# Patient Record
Sex: Female | Born: 1955 | Race: White | Hispanic: No | Smoking: Never smoker
Health system: Southern US, Community
[De-identification: ages and names within clinical notes are randomized; demographics above are authoritative.]

## PROBLEM LIST (undated history)

## (undated) DIAGNOSIS — Z780 Asymptomatic menopausal state: Secondary | ICD-10-CM

## (undated) DIAGNOSIS — N6019 Diffuse cystic mastopathy of unspecified breast: Secondary | ICD-10-CM

## (undated) DIAGNOSIS — K219 Gastro-esophageal reflux disease without esophagitis: Secondary | ICD-10-CM

## (undated) HISTORY — DX: Diffuse cystic mastopathy of unspecified breast: N60.19

## (undated) HISTORY — PX: TUBAL LIGATION: SHX77

## (undated) HISTORY — DX: Gastro-esophageal reflux disease without esophagitis: K21.9

## (undated) HISTORY — PX: MOUTH SURGERY: SHX715

## (undated) HISTORY — DX: Asymptomatic menopausal state: Z78.0

---

## 2000-08-20 HISTORY — PX: BREAST CYST ASPIRATION: SHX578

## 2005-01-23 ENCOUNTER — Ambulatory Visit: Payer: Self-pay

## 2006-01-30 ENCOUNTER — Ambulatory Visit: Payer: Self-pay

## 2006-03-21 ENCOUNTER — Ambulatory Visit: Payer: Self-pay | Admitting: Gastroenterology

## 2007-02-13 ENCOUNTER — Ambulatory Visit: Payer: Self-pay | Admitting: Obstetrics and Gynecology

## 2008-02-17 ENCOUNTER — Ambulatory Visit: Payer: Self-pay

## 2008-08-20 HISTORY — PX: BREAST BIOPSY: SHX20

## 2009-03-23 ENCOUNTER — Ambulatory Visit: Payer: Self-pay | Admitting: Internal Medicine

## 2009-03-30 ENCOUNTER — Ambulatory Visit: Payer: Self-pay | Admitting: Internal Medicine

## 2009-04-19 ENCOUNTER — Ambulatory Visit: Payer: Self-pay | Admitting: Surgery

## 2009-05-10 ENCOUNTER — Ambulatory Visit: Payer: Self-pay | Admitting: Internal Medicine

## 2009-11-29 ENCOUNTER — Ambulatory Visit: Payer: Self-pay | Admitting: Internal Medicine

## 2010-03-28 ENCOUNTER — Ambulatory Visit: Payer: Self-pay | Admitting: Internal Medicine

## 2010-04-05 ENCOUNTER — Emergency Department: Payer: Self-pay | Admitting: Emergency Medicine

## 2010-05-17 ENCOUNTER — Ambulatory Visit: Payer: Self-pay | Admitting: Internal Medicine

## 2011-03-30 ENCOUNTER — Ambulatory Visit: Payer: Self-pay | Admitting: Internal Medicine

## 2011-05-16 ENCOUNTER — Encounter: Payer: Self-pay | Admitting: Internal Medicine

## 2012-01-07 ENCOUNTER — Encounter: Payer: Self-pay | Admitting: Internal Medicine

## 2012-01-08 ENCOUNTER — Encounter: Payer: Self-pay | Admitting: Internal Medicine

## 2012-01-08 ENCOUNTER — Ambulatory Visit (INDEPENDENT_AMBULATORY_CARE_PROVIDER_SITE_OTHER): Payer: No Typology Code available for payment source | Admitting: Internal Medicine

## 2012-01-08 ENCOUNTER — Other Ambulatory Visit (HOSPITAL_COMMUNITY)
Admission: RE | Admit: 2012-01-08 | Discharge: 2012-01-08 | Disposition: A | Discharge status: Home / Self Care | Payer: No Typology Code available for payment source | Source: Ambulatory Visit | Attending: Internal Medicine | Admitting: Internal Medicine

## 2012-01-08 VITALS — BP 100/62 | HR 90 | Temp 98.3°F | Resp 14 | Ht 60.0 in | Wt 105.8 lb

## 2012-01-08 DIAGNOSIS — Z113 Encounter for screening for infections with a predominantly sexual mode of transmission: Secondary | ICD-10-CM | POA: Insufficient documentation

## 2012-01-08 DIAGNOSIS — Z0000 Encounter for general adult medical examination without abnormal findings: Secondary | ICD-10-CM

## 2012-01-08 DIAGNOSIS — N72 Inflammatory disease of cervix uteri: Secondary | ICD-10-CM

## 2012-01-08 DIAGNOSIS — Z01419 Encounter for gynecological examination (general) (routine) without abnormal findings: Secondary | ICD-10-CM | POA: Insufficient documentation

## 2012-01-08 DIAGNOSIS — Z124 Encounter for screening for malignant neoplasm of cervix: Secondary | ICD-10-CM

## 2012-01-08 DIAGNOSIS — G609 Hereditary and idiopathic neuropathy, unspecified: Secondary | ICD-10-CM | POA: Insufficient documentation

## 2012-01-08 DIAGNOSIS — Z1159 Encounter for screening for other viral diseases: Secondary | ICD-10-CM | POA: Insufficient documentation

## 2012-01-08 DIAGNOSIS — G608 Other hereditary and idiopathic neuropathies: Secondary | ICD-10-CM

## 2012-01-08 DIAGNOSIS — Z1211 Encounter for screening for malignant neoplasm of colon: Secondary | ICD-10-CM

## 2012-01-08 MED ORDER — AZITHROMYCIN 500 MG PO TABS: ORAL_TABLET | ORAL | Status: AC

## 2012-01-08 MED ORDER — CEFTRIAXONE SODIUM 1 G IJ SOLR
250.0000 mg | Freq: Once | INTRAMUSCULAR | Status: AC
  Administered 2012-01-08: 250 mg via INTRAMUSCULAR

## 2012-01-08 NOTE — Assessment & Plan Note (Addendum)
With history of vaginal discharge, strawberry cervix on exam, Will treat empirically for GC/chlamydia pending cultures since she is sexually active,

## 2012-01-08 NOTE — Progress Notes (Signed)
Patient ID: Debbie Sanchez, female   DOB: 1956-04-10, 56 y.o.   MRN: [id number] Patient Active Problem List  Diagnoses  . Neuropathy, idiopathic  . Cervicitis and endocervicitis  . Routine general medical examination at a health care facility    Subjective:  CC:   Chief Complaint  Patient presents with  . Follow-up    HPI:   Debbie Sanchez a 56 y.o. female who presents for her annual gyn exam.  She was last seen one year ago for her annual exam.  She has had a tumultuous years.  She was widowed 3 years ago, and has had an intentional wt loss of 35 lbs over the past year with diet and exercise.  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 With the weight loss her sex drive has returned.   She is sexually active with one partner but states that she is not emotionally involved and does not plan to remarry.  She is using barrier protection consistently with condoms  She has developed vaginal discharge recently without pain.  No systemic symptoms. Her last mammo August 2012.   She has noted occasional blood in her stools and is due for colonoscopy with Dr. Lutricia Feil. Has had a history of syncopal episodes and urinary tract infections but none in the past year. Needs full DEXA since she has lost 35 lbs.      Past Medical History  Diagnosis Date  . Fibrocystic breast disease     rt breast aspiration 2002  . Menopause     last menses 2008    Past Surgical History  Procedure Date  . Tubal ligation   . Cesarean section 1998  . Mouth surgery          The following portions of the patient's history were reviewed and updated as appropriate: Allergies, current medications, and problem list.    Review of Systems:   12 Pt  review of systems was negative except those addressed in the HPI,     History   Social History  . Marital Status: Widowed    Spouse Name: N/A    Number of Children: N/A  . Years of Education: N/A   Occupational History  . Not on file.   Social History Main  Topics  . Smoking status: Never Smoker   . Smokeless tobacco: Never Used  . Alcohol Use: Yes     rare  . Drug Use: No  . Sexually Active: Not on file   Other Topics Concern  . Not on file   Social History Narrative  . No narrative on file    Objective:  BP 100/62  Pulse 90  Temp(Src) 98.3 F (36.8 C) (Oral)  Resp 14  Ht 5' (1.524 m)  Wt 105 lb 12 oz (47.968 kg)  BMI 20.65 kg/m2  SpO2 94%  General appearance: alert, cooperative and appears stated age Ears: normal TM's and external ear canals both ears Throat: lips, mucosa, and tongue normal; teeth and gums normal Neck: no adenopathy, no carotid bruit, supple, symmetrical, trachea midline and thyroid not enlarged, symmetric, no tenderness/mass/nodules Back: symmetric, no curvature. ROM normal. No CVA tenderness. Lungs: clear to auscultation bilaterally Heart: regular rate and rhythm, S1, S2 normal, no murmur, click, rub or gallop Abdomen: soft, non-tender; bowel sounds normal; no masses,  no organomegaly Pulses: 2+ and symmetric Skin: Skin color, texture, turgor normal. No rashes or lesions Lymph nodes: Cervical, supraclavicular, and axillary nodes normal.  Assessment and Plan:  Cervicitis and endocervicitis  With history of vaginal discharge, strawberry cervix on exam, Will treat empirically for GC/chlamydia pending cultures since she is sexually active,  Routine general medical examination at a health care facility Breast and pelvic/PAP were done today.     Updated Medication List Outpatient Encounter Prescriptions as of 01/08/2012  Medication Sig Dispense Refill  . Ascorbic Acid (VITAMIN C) 100 MG tablet Take 100 mg by mouth daily.      . Calcium Carbonate (CALCIUM 500 PO) Take 1 tablet by mouth daily.      . Cholecalciferol (VITAMIN D) 2000 UNITS CAPS Take 1 capsule by mouth daily.      . Diphenhydramine-APAP, sleep, (TYLENOL PM EXTRA STRENGTH PO) Take by mouth. OTC take as directed.      . IBUPROFEN PO Take by  mouth as directed.      Marland Kitchen MELATONIN ER PO Take by mouth.      . Multiple Vitamin (MULTIVITAMIN) tablet Take 1 tablet by mouth daily.      Marland Kitchen azithromycin (ZITHROMAX) 500 MG tablet 4 tablets one time only,  4 tablet  0  . cefTRIAXone (ROCEPHIN) injection 250 mg          Orders Placed This Encounter  Procedures  . HM MAMMOGRAPHY  . HM PAP SMEAR  . Ambulatory referral to Gastroenterology  . HM COLONOSCOPY    No Follow-up on file.

## 2012-01-09 ENCOUNTER — Encounter: Payer: Self-pay | Admitting: Internal Medicine

## 2012-01-09 DIAGNOSIS — Z0000 Encounter for general adult medical examination without abnormal findings: Secondary | ICD-10-CM | POA: Insufficient documentation

## 2012-01-09 NOTE — Assessment & Plan Note (Signed)
Breast and pelvic/PAP were done today.

## 2012-01-11 ENCOUNTER — Telehealth: Payer: Self-pay | Admitting: Internal Medicine

## 2012-01-11 ENCOUNTER — Other Ambulatory Visit: Payer: Self-pay | Admitting: Internal Medicine

## 2012-01-11 NOTE — Telephone Encounter (Signed)
Her blood tests were all normal, including including cholesterol thyroid and diabetes screen.  The cultures we took at her visit are not back yet

## 2012-01-11 NOTE — Telephone Encounter (Signed)
Patient informed/SLS  

## 2012-01-17 ENCOUNTER — Encounter: Payer: Self-pay | Admitting: Internal Medicine

## 2012-03-03 ENCOUNTER — Encounter: Payer: Self-pay | Admitting: Internal Medicine

## 2012-03-03 ENCOUNTER — Ambulatory Visit (INDEPENDENT_AMBULATORY_CARE_PROVIDER_SITE_OTHER): Payer: No Typology Code available for payment source | Admitting: Internal Medicine

## 2012-03-03 VITALS — BP 120/78 | HR 72 | Temp 98.5°F | Wt 103.2 lb

## 2012-03-03 DIAGNOSIS — L0231 Cutaneous abscess of buttock: Secondary | ICD-10-CM

## 2012-03-03 DIAGNOSIS — L03317 Cellulitis of buttock: Secondary | ICD-10-CM

## 2012-03-03 DIAGNOSIS — Z113 Encounter for screening for infections with a predominantly sexual mode of transmission: Secondary | ICD-10-CM

## 2012-03-03 MED ORDER — CEPHALEXIN 500 MG PO TABS: 500.0000 mg | ORAL_TABLET | Freq: Three times a day (TID) | ORAL | Status: AC

## 2012-03-03 NOTE — Progress Notes (Signed)
Patient ID: Debbie Sanchez, female   DOB: 03-30-1956, 56 y.o.   MRN: [id number] Patient Active Problem List  Diagnosis  . Neuropathy, idiopathic  . Cervicitis and endocervicitis  . Routine general medical examination at a health care facility  . Screening for STD (sexually transmitted disease)    Subjective:  CC:   Chief Complaint  Patient presents with  . screening for std    HPI:   Debbie Sanchez a 56 y.o. female who presents  Past Medical History  Diagnosis Date  . Fibrocystic breast disease     rt breast aspiration 2002  . Menopause     last menses 2008    Past Surgical History  Procedure Date  . Tubal ligation   . Cesarean section 1998  . Mouth surgery          The following portions of the patient's history were reviewed and updated as appropriate: Allergies, current medications, and problem list.    Review of Systems:   12 Pt  review of systems was negative except those addressed in the HPI,     History   Social History  . Marital Status: Widowed    Spouse Name: N/A    Number of Children: N/A  . Years of Education: N/A   Occupational History  . Not on file.   Social History Main Topics  . Smoking status: Never Smoker   . Smokeless tobacco: Never Used  . Alcohol Use: Yes     rare  . Drug Use: No  . Sexually Active: Not on file   Other Topics Concern  . Not on file   Social History Narrative  . No narrative on file    Objective:  BP 120/78  Pulse 72  Temp 98.5 F (36.9 C) (Oral)  Wt 103 lb 4 oz (46.834 kg)  SpO2 99%  . General appearance: alert, cooperative and appears stated age Skin: Skin color, texture, turgor normal. Several papular nodules on chin. GYN;  Normal vaginal vault. Some thin yellow discharge. Lymph nodes: Cervical, supraclavicular, and axillary nodes normal.  Assessment and Plan:  Screening for STD (sexually transmitted disease) She is requesting screenig for HIV, Hep C HSV, GC and chlamydia and RPR due to  recent unprotected sex.   Updated Medication List Outpatient Encounter Prescriptions as of 03/03/2012  Medication Sig Dispense Refill  . Ascorbic Acid (VITAMIN C) 100 MG tablet Take 100 mg by mouth daily.      . Calcium Carbonate (CALCIUM 500 PO) Take 1 tablet by mouth daily.      . Cholecalciferol (VITAMIN D) 2000 UNITS CAPS Take 1 capsule by mouth daily.      . Diphenhydramine-APAP, sleep, (TYLENOL PM EXTRA STRENGTH PO) Take by mouth. OTC take as directed.      . IBUPROFEN PO Take by mouth as directed.      Marland Kitchen MELATONIN ER PO Take by mouth.      . Multiple Vitamin (MULTIVITAMIN) tablet Take 1 tablet by mouth daily.      . Cephalexin 500 MG tablet Take 1 tablet (500 mg total) by mouth 3 (three) times daily.  21 tablet  0     Orders Placed This Encounter  Procedures  . Culture, routine-genital  . HSV(herpes simplex vrs) 1+2 ab-IgM  . HSV(herpes simplex vrs) 1+2 ab-IgG  . RPR    No Follow-up on file.

## 2012-03-04 ENCOUNTER — Ambulatory Visit: Payer: No Typology Code available for payment source | Admitting: Internal Medicine

## 2012-03-04 ENCOUNTER — Encounter: Payer: Self-pay | Admitting: Internal Medicine

## 2012-03-04 DIAGNOSIS — Z0000 Encounter for general adult medical examination without abnormal findings: Secondary | ICD-10-CM | POA: Insufficient documentation

## 2012-03-04 DIAGNOSIS — Z113 Encounter for screening for infections with a predominantly sexual mode of transmission: Secondary | ICD-10-CM | POA: Insufficient documentation

## 2012-03-04 LAB — HSV(HERPES SIMPLEX VRS) I + II AB-IGG: HSV 1 Glycoprotein G Ab, IgG: <0.1 IV

## 2012-03-04 NOTE — Assessment & Plan Note (Signed)
She is requesting screenig for HIV, Hep C HSV, GC and chlamydia and RPR due to recent unprotected sex.

## 2012-03-05 LAB — HM PAP SMEAR: HM Pap smear: NORMAL

## 2012-03-06 ENCOUNTER — Encounter: Payer: Self-pay | Admitting: Internal Medicine

## 2012-03-26 ENCOUNTER — Ambulatory Visit: Payer: Self-pay | Admitting: Gastroenterology

## 2012-04-01 ENCOUNTER — Telehealth: Payer: Self-pay | Admitting: Internal Medicine

## 2012-04-01 NOTE — Telephone Encounter (Signed)
Colonoscopy was normal.  Repeat in 5 yrs

## 2012-04-05 LAB — HM MAMMOGRAPHY: HM Mammogram: NORMAL

## 2012-04-07 ENCOUNTER — Ambulatory Visit: Payer: Self-pay | Admitting: Internal Medicine

## 2012-04-11 ENCOUNTER — Encounter: Payer: Self-pay | Admitting: Internal Medicine

## 2012-04-17 ENCOUNTER — Encounter: Payer: Self-pay | Admitting: Internal Medicine

## 2012-10-04 ENCOUNTER — Other Ambulatory Visit: Payer: Self-pay

## 2013-03-03 ENCOUNTER — Encounter: Payer: Self-pay | Admitting: Internal Medicine

## 2013-03-03 ENCOUNTER — Ambulatory Visit (INDEPENDENT_AMBULATORY_CARE_PROVIDER_SITE_OTHER): Payer: No Typology Code available for payment source | Admitting: Internal Medicine

## 2013-03-03 VITALS — BP 94/54 | HR 90 | Temp 98.3°F | Resp 16 | Ht 60.0 in | Wt 98.0 lb

## 2013-03-03 DIAGNOSIS — Z0000 Encounter for general adult medical examination without abnormal findings: Secondary | ICD-10-CM

## 2013-03-03 DIAGNOSIS — R5383 Other fatigue: Secondary | ICD-10-CM

## 2013-03-03 DIAGNOSIS — E559 Vitamin D deficiency, unspecified: Secondary | ICD-10-CM

## 2013-03-03 DIAGNOSIS — I471 Supraventricular tachycardia: Secondary | ICD-10-CM

## 2013-03-03 DIAGNOSIS — R5381 Other malaise: Secondary | ICD-10-CM

## 2013-03-03 DIAGNOSIS — E673 Hypervitaminosis D: Secondary | ICD-10-CM

## 2013-03-03 DIAGNOSIS — R634 Abnormal weight loss: Secondary | ICD-10-CM

## 2013-03-03 DIAGNOSIS — Z1239 Encounter for other screening for malignant neoplasm of breast: Secondary | ICD-10-CM

## 2013-03-03 LAB — POCT URINALYSIS DIPSTICK
Bilirubin, UA: NEGATIVE
Glucose, UA: NEGATIVE
Ketones, UA: NEGATIVE
pH, UA: 7

## 2013-03-03 NOTE — Progress Notes (Signed)
Patient ID: Debbie Sanchez, female   DOB: 08/17/1956, 57 y.o.   MRN: [id number]   Subjective:     Debbie Sanchez is a 57 y.o. female and is here for a comprehensive physical exam. The patient reports frequent  nocturia with hot flashes,  And falling asleep for cat naps during the day which are aggravated by long solitary drives .  If she takes a 15 min nap she is completely revived. Caffeine intake reviewed.  Even if she abstains from all liquid after 3 pm,  Still voids 3 times per night  Other times only once per night .       History   Social History  . Marital Status: Widowed    Spouse Name: N/A    Number of Children: N/A  . Years of Education: N/A   Occupational History  . Not on file.   Social History Main Topics  . Smoking status: Never Smoker   . Smokeless tobacco: Never Used  . Alcohol Use: Yes     Comment: rare  . Drug Use: No  . Sexually Active: Not on file   Other Topics Concern  . Not on file   Social History Narrative  . No narrative on file   Health Maintenance  Topic Date Due  . Influenza Vaccine  04/20/2013  . Mammogram  04/07/2014  . Pap Smear  01/08/2015  . Tetanus/tdap  03/03/2020  . Colonoscopy  03/26/2022    The following portions of the patient's history were reviewed and updated as appropriate: allergies, past family history, past medical history, past social history, past surgical history and problem list.  Review of Systems A comprehensive review of systems was negative.   Objective:   BP 94/54  Pulse 90  Temp(Src) 98.3 F (36.8 C) (Oral)  Resp 16  Ht 5' (1.524 m)  Wt 98 lb (44.453 kg)  BMI 19.14 kg/m2  SpO2 98%  BP 94/54  Pulse 90  Temp(Src) 98.3 F (36.8 C) (Oral)  Resp 16  Ht 5' (1.524 m)  Wt 98 lb (44.453 kg)  BMI 19.14 kg/m2  SpO2 98%  General Appearance:    Alert, cooperative, no distress, appears stated age  Head:    Normocephalic, without obvious abnormality, atraumatic  Eyes:    PERRL, conjunctiva/corneas clear, EOM's  intact, fundi    benign, both eyes  Ears:    Normal TM's and external ear canals, both ears  Nose:   Nares normal, septum midline, mucosa normal, no drainage    or sinus tenderness  Throat:   Lips, mucosa, and tongue normal; teeth and gums normal  Neck:   Supple, symmetrical, trachea midline, no adenopathy;    thyroid:  no enlargement/tenderness/nodules; no carotid   bruit or JVD  Back:     Symmetric, no curvature, ROM normal, no CVA tenderness  Lungs:     Clear to auscultation bilaterally, respirations unlabored  Chest Wall:    No tenderness or deformity   Heart:    Regular rate and rhythm, S1 and S2 normal, no murmur, rub   or gallop  Breast Exam:    No tenderness, masses, or nipple abnormality  Abdomen:     Soft, non-tender, bowel sounds active all four quadrants,    no masses, no organomegaly        Extremities:   Extremities normal, atraumatic, no cyanosis or edema  Pulses:   2+ and symmetric all extremities  Skin:   Skin color, texture, turgor normal, no rashes or lesions  Lymph nodes:   Cervical, supraclavicular, and axillary nodes normal  Neurologic:   CNII-XII intact, normal strength, sensation and reflexes    throughout    .    Assessment:   Routine general medical examination at a health care facility Annual comprehensive exam was done including breast, but excluding pelvic exam . All screenings have been addressed .   Hypervitaminosis d She has been taking excessive amounts of daily Vitamin D and her level is high at 120.  She has been advised to stop all supplements, and repeat in 3 months    Updated Medication List Outpatient Encounter Prescriptions as of 03/03/2013  Medication Sig Dispense Refill  . Ascorbic Acid (VITAMIN C) 100 MG tablet Take 1,000 mg by mouth 2 (two) times daily.       . Calcium Carbonate (CALCIUM 500 PO) Take 1 tablet by mouth daily.      . Cholecalciferol (VITAMIN D3) 5000 UNITS CAPS Take 1 capsule by mouth 2 (two) times daily.      .  Diphenhydramine-APAP, sleep, (TYLENOL PM EXTRA STRENGTH PO) Take by mouth. OTC take as directed.      . IBUPROFEN PO Take by mouth as directed.      Marland Kitchen MELATONIN ER PO Take by mouth.      . Multiple Vitamin (MULTIVITAMIN) tablet Take 1 tablet by mouth daily.      . [DISCONTINUED] Cholecalciferol (VITAMIN D) 2000 UNITS CAPS Take 1 capsule by mouth daily.       No facility-administered encounter medications on file as of 03/03/2013.

## 2013-03-04 ENCOUNTER — Encounter: Payer: Self-pay | Admitting: Internal Medicine

## 2013-03-04 DIAGNOSIS — E673 Hypervitaminosis D: Secondary | ICD-10-CM | POA: Insufficient documentation

## 2013-03-04 LAB — COMPREHENSIVE METABOLIC PANEL
ALT: 25 U/L (ref 0–35)
AST: 25 U/L (ref 0–37)
Albumin: 4.4 g/dL (ref 3.5–5.2)
CO2: 31 mEq/L (ref 19–32)
Calcium: 9.6 mg/dL (ref 8.4–10.5)
Chloride: 101 mEq/L (ref 96–112)
Creatinine, Ser: 0.7 mg/dL (ref 0.4–1.2)
GFR: 94.85 mL/min (ref >60.00)
Potassium: 3.8 mEq/L (ref 3.5–5.1)
Total Protein: 7.7 g/dL (ref 6.0–8.3)

## 2013-03-04 LAB — CBC WITH DIFFERENTIAL/PLATELET
Basophils Absolute: 0 10*3/uL (ref 0.0–0.1)
Eosinophils Absolute: 0.1 10*3/uL (ref 0.0–0.7)
Lymphocytes Relative: 24.3 % (ref 12.0–46.0)
MCHC: 33.5 g/dL (ref 30.0–36.0)
MCV: 94 fl (ref 78.0–100.0)
Monocytes Absolute: 0.6 10*3/uL (ref 0.1–1.0)
Neutrophils Relative %: 65.7 % (ref 43.0–77.0)
Platelets: 250 10*3/uL (ref 150.0–400.0)
RDW: 12.8 % (ref 11.5–14.6)

## 2013-03-04 LAB — TSH: TSH: 0.88 u[IU]/mL (ref 0.35–5.50)

## 2013-03-04 NOTE — Assessment & Plan Note (Signed)
Annual comprehensive exam was done including breast, but excluding pelvic exam . All screenings have been addressed .

## 2013-03-04 NOTE — Assessment & Plan Note (Signed)
She has been taking excessive amounts of daily Vitamin D and her level is high at 120.  She has been advised to stop all supplements, and repeat in 3 months

## 2013-03-05 ENCOUNTER — Telehealth: Payer: Self-pay | Admitting: *Deleted

## 2013-03-05 NOTE — Telephone Encounter (Signed)
Patient called and stated work is not set up for 3 D mammogram at this time, do you advise her wait for 3 D or go ahead at work and have regular mammogram.

## 2013-03-06 NOTE — Telephone Encounter (Signed)
I would wait since she has very dense breasts.. where does she get her mammograms done because [facility] will have 3 D  in August.

## 2013-03-06 NOTE — Telephone Encounter (Signed)
Patient is going to have the 3D done at [facility]. FYI

## 2013-05-25 ENCOUNTER — Encounter: Payer: Self-pay | Admitting: Emergency Medicine

## 2013-06-02 ENCOUNTER — Telehealth: Payer: Self-pay | Admitting: *Deleted

## 2013-06-02 NOTE — Telephone Encounter (Signed)
Pt called needing order for repeat Vitamin D. [place] [facility] order placed in Dr. Melina Schools folder. Call pt when ready

## 2013-06-03 NOTE — Telephone Encounter (Signed)
Pt notified order ready for pick up 

## 2013-06-25 ENCOUNTER — Other Ambulatory Visit: Payer: Self-pay

## 2013-10-28 ENCOUNTER — Telehealth: Payer: Self-pay | Admitting: *Deleted

## 2013-10-28 NOTE — Telephone Encounter (Signed)
You requested patient have 3 D mammogram and [facility] still does not have equipment per patient please advise/

## 2013-10-28 NOTE — Telephone Encounter (Signed)
[facility] is where I recommend,  On [address]

## 2013-10-29 NOTE — Telephone Encounter (Signed)
Patient notified and will decide.

## 2014-01-13 ENCOUNTER — Telehealth: Payer: Self-pay | Admitting: Internal Medicine

## 2014-01-13 DIAGNOSIS — Z1239 Encounter for other screening for malignant neoplasm of breast: Secondary | ICD-10-CM

## 2014-01-13 NOTE — Telephone Encounter (Signed)
Pt called wanting to know if dr Derrel Nip  wants her to have 3d mammogram or a regular mammogram

## 2014-01-13 NOTE — Telephone Encounter (Signed)
Please advise 

## 2014-01-13 NOTE — Telephone Encounter (Signed)
Yes, and [facility] doe snot have theirs up and running yet.,  Will need to go to [facility],  When was her last  One?  I have none since 2013.

## 2014-01-14 NOTE — Telephone Encounter (Signed)
Patient notified preference for 3D mammogram, patient stated the date of 2013 is accurate.

## 2014-01-14 NOTE — Telephone Encounter (Signed)
ordered

## 2014-03-16 ENCOUNTER — Encounter: Payer: No Typology Code available for payment source | Admitting: Internal Medicine

## 2014-04-05 ENCOUNTER — Encounter: Payer: Self-pay | Admitting: Internal Medicine

## 2014-04-05 ENCOUNTER — Ambulatory Visit (INDEPENDENT_AMBULATORY_CARE_PROVIDER_SITE_OTHER): Payer: No Typology Code available for payment source | Admitting: Internal Medicine

## 2014-04-05 VITALS — BP 118/72 | HR 80 | Temp 98.4°F | Resp 16 | Ht 60.0 in | Wt 102.0 lb

## 2014-04-05 DIAGNOSIS — Z0000 Encounter for general adult medical examination without abnormal findings: Secondary | ICD-10-CM

## 2014-04-05 DIAGNOSIS — M810 Age-related osteoporosis without current pathological fracture: Secondary | ICD-10-CM | POA: Insufficient documentation

## 2014-04-05 DIAGNOSIS — M25552 Pain in left hip: Secondary | ICD-10-CM

## 2014-04-05 DIAGNOSIS — Z13820 Encounter for screening for osteoporosis: Secondary | ICD-10-CM

## 2014-04-05 DIAGNOSIS — N941 Unspecified dyspareunia: Secondary | ICD-10-CM

## 2014-04-05 DIAGNOSIS — M25559 Pain in unspecified hip: Secondary | ICD-10-CM

## 2014-04-05 DIAGNOSIS — E673 Hypervitaminosis D: Secondary | ICD-10-CM

## 2014-04-05 DIAGNOSIS — IMO0002 Reserved for concepts with insufficient information to code with codable children: Secondary | ICD-10-CM

## 2014-04-05 DIAGNOSIS — Z1239 Encounter for other screening for malignant neoplasm of breast: Secondary | ICD-10-CM

## 2014-04-05 NOTE — Progress Notes (Signed)
Patient ID: Debbie Sanchez, female   DOB: [birth year], 58 y.o.   MRN: [id number]    Subjective:     Debbie Sanchez is a 58 y.o. female and is here for a comprehensive physical exam. The patient reports multiple minor issues .  Son is back home living with her after leaving 4 yr college prematurely,  Now going to community college .  Dtr is sr in high school.  Also planning on going to community college.  His presence at home has created some internal conflict directed at her boyfriend when he spends the night. She has been in a stable monogamous relationship for several years. 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  Spent 30 minutes reviewing a list of medical concerns prior to proceeding with her annual exam, including :  1)  has noticed interval episodes of left sided hip pain that radiates down lateral side of left leg.  Occurs during intercourse .  Has also noted discomfort/dyspareunia during pool sex. Has an above ground swimming pool and wonders if the chlorine  Is irritating her vaginal tissue     2) Recurrence of floaters in left eye.  Retina looked fine at Urgent Care so they did a CT scan and fingerstick glucose  Both of which were fine  3)Small nontender mss on left forearm, new.    4) Since she has  started drinking  coffee,  She Has been more constipated   5) Several epiodes of recurrent sharp pain starting in her left ear that radiates to jaw.  Occurs only when  she turns her head,  4 or 5 times   6) Recurrent episodes of moderately severe tailbone pain that resolves in a day or two.  Not constant ,  Just occurs when she sits down or shifts weight .       History   Social History  . Marital Status: Widowed    Spouse Name: N/A    Number of Children: N/A  . Years of Education: N/A   Occupational History  . Not on file.   Social History Main Topics  . Smoking status: Never Smoker   .  Smokeless tobacco: Never Used  . Alcohol Use: Yes     Comment: rare  . Drug Use: No  . Sexual Activity: Not on file   Other Topics Concern  . Not on file   Social History Narrative  . No narrative on file   Health Maintenance  Topic Date Due  . Influenza Vaccine  03/20/2014  . Mammogram  04/07/2014  . Pap Smear  03/06/2015  . Tetanus/tdap  03/03/2020  . Colonoscopy  03/26/2022    The following portions of the patient's history were reviewed and updated as appropriate: allergies, current medications, past family history, past medical history, past social history, past surgical history and problem list.  Review of Systems A comprehensive review of systems was negative.   Objective:   BP 118/72  Pulse 80  Temp(Src) 98.4 F (36.9 C) (Oral)  Resp 16  Ht 5' (1.524 m)  Wt 102 lb (46.267 kg)  BMI 19.92 kg/m2  SpO2 99% General appearance: alert, cooperative and appears stated age Head: Normocephalic, without obvious abnormality, atraumatic Eyes: conjunctivae/corneas clear. PERRL, EOM's intact. Fundi benign. Ears: normal TM's and external ear canals both ears Nose: Nares normal. Septum midline. Mucosa normal. No drainage or sinus tenderness. Throat: lips, mucosa,  and tongue normal; teeth and gums normal Neck: no adenopathy, no carotid bruit, no JVD, supple, symmetrical, trachea midline and thyroid not enlarged, symmetric, no tenderness/mass/nodules Lungs: clear to auscultation bilaterally Breasts: normal appearance, no masses or tenderness Heart: regular rate and rhythm, S1, S2 normal, no murmur, click, rub or gallop Abdomen: soft, non-tender; bowel sounds normal; no masses,  no organomegaly Extremities: extremities normal, atraumatic, no cyanosis or edema Pulses: 2+ and symmetric Skin: Skin color, texture, turgor normal. No rashes or lesions.  No evidence of pilonidal cyst  Neurologic: Alert and oriented X 3, normal strength and tone. Normal symmetric reflexes. Normal  coordination and gait.   .    Assessment and Plan:    Routine general medical examination at a health care facility Annual wellness  exam was done as well as a comprehensive physical exam and management of acute and chronic conditions .  During the course of the visit the patient was educated and counseled about appropriate screening and preventive services including : fall prevention , diabetes screening, nutrition counseling, colorectal cancer screening, and recommended immunizations.  Printed recommendations for health maintenance screenings was given.   Hypervitaminosis d She is currently taking only amounts contained in her multii vitamin.  Repeat level is pending   Screening for osteoporosis She is at risk due to low body weight and post menopausal status.  DEXA  Ordered   Dyspareunia, female Due to the lengthy list of questions and concerns, there was not sufficient time for a pelvic exam today . However her history of discomfort only occurs during pool sex, which suggests that lack of lubrication may be the cause. She will return for pelvic exam if use of  Water resistant lubricants do not help.   Left hip pain She has normal ROM on exam today. Symptoms are likely due to strain during intercourse.  Suggested stretching exercises .    Updated Medication List Outpatient Encounter Prescriptions as of 04/05/2014  Medication Sig  . Ascorbic Acid (VITAMIN C) 100 MG tablet Take 1,000 mg by mouth 2 (two) times daily.   . Calcium Carbonate (CALCIUM 500 PO) Take 1 tablet by mouth daily.  . Diphenhydramine-APAP, sleep, (TYLENOL PM EXTRA STRENGTH PO) Take by mouth. OTC take as directed.  . IBUPROFEN PO Take by mouth as directed.  Marland Kitchen MELATONIN ER PO Take by mouth.  . Multiple Vitamin (MULTIVITAMIN) tablet Take 1 tablet by mouth daily.  . Cholecalciferol (VITAMIN D3) 5000 UNITS CAPS Take 1 capsule by mouth 2 (two) times daily.

## 2014-04-05 NOTE — Progress Notes (Signed)
Pre-visit discussion using our clinic review tool. No additional management support is needed unless otherwise documented below in the visit note.  

## 2014-04-05 NOTE — Patient Instructions (Signed)
You are doing well!!   Your vitamin D, CBC, thyroid, cholesterol,  liver and kidney function will be checked at [facility] using the order form I have given you.    Health Maintenance Adopting a healthy lifestyle and getting preventive care can go a long way to promote health and wellness. Talk with your health care provider about what schedule of regular examinations is right for you. This is a good chance for you to check in with your provider about disease prevention and staying healthy. In between checkups, there are plenty of things you can do on your own. Experts have done a lot of research about which lifestyle changes and preventive measures are most likely to keep you healthy. Ask your health care provider for more information. WEIGHT AND DIET  Eat a healthy diet  Be sure to include plenty of vegetables, fruits, low-fat dairy products, and lean protein.  Do not eat a lot of foods high in solid fats, added sugars, or salt.  Get regular exercise. This is one of the most important things you can do for your health.  Most adults should exercise for at least 150 minutes each week. The exercise should increase your heart rate and make you sweat (moderate-intensity exercise).  Most adults should also do strengthening exercises at least twice a week. This is in addition to the moderate-intensity exercise.  Maintain a healthy weight  Body mass index (BMI) is a measurement that can be used to identify possible weight problems. It estimates body fat based on height and weight. Your health care provider can help determine your BMI and help you achieve or maintain a healthy weight.  For females 32 years of age and older:   A BMI below 18.5 is considered underweight.  A BMI of 18.5 to 24.9 is normal.  A BMI of 25 to 29.9 is considered overweight.  A BMI of 30 and above is considered obese.  Watch levels of cholesterol and blood lipids  You should start having your blood tested  for lipids and cholesterol at 58 years of age, then have this test every 5 years.  You may need to have your cholesterol levels checked more often if:  Your lipid or cholesterol levels are high.  You are older than 58 years of age.  You are at high risk for heart disease.  CANCER SCREENING   Lung Cancer  Lung cancer screening is recommended for adults 70-14 years old who are at high risk for lung cancer because of a history of smoking.  A yearly low-dose CT scan of the lungs is recommended for people who:  Currently smoke.  Have quit within the past 15 years.  Have at least a 30-pack-year history of smoking. A pack year is smoking an average of one pack of cigarettes a day for 1 year.  Yearly screening should continue until it has been 15 years since you quit.  Yearly screening should stop if you develop a health problem that would prevent you from having lung cancer treatment.  Breast Cancer  Practice breast self-awareness. This means understanding how your breasts normally appear and feel.  It also means doing regular breast self-exams. Let your health care provider know about any changes, no matter how small.  If you are in your 20s or 30s, you should have a clinical breast exam (CBE) by a health care provider every 1-3 years as part of a regular health exam.  If you are 80 or older, have a CBE  every year. Also consider having a breast X-ray (mammogram) every year.  If you have a family history of breast cancer, talk to your health care provider about genetic screening.  If you are at high risk for breast cancer, talk to your health care provider about having an MRI and a mammogram every year.  Breast cancer gene (BRCA) assessment is recommended for women who have family members with BRCA-related cancers. BRCA-related cancers include:  Breast.  Ovarian.  Tubal.  Peritoneal cancers.  Results of the assessment will determine the need for genetic counseling and  BRCA1 and BRCA2 testing. Cervical Cancer Routine pelvic examinations to screen for cervical cancer are no longer recommended for nonpregnant women who are considered low risk for cancer of the pelvic organs (ovaries, uterus, and vagina) and who do not have symptoms. A pelvic examination may be necessary if you have symptoms including those associated with pelvic infections. Ask your health care provider if a screening pelvic exam is right for you.   The Pap test is the screening test for cervical cancer for women who are considered at risk.  If you had a hysterectomy for a problem that was not cancer or a condition that could lead to cancer, then you no longer need Pap tests.  If you are older than 65 years, and you have had normal Pap tests for the past 10 years, you no longer need to have Pap tests.  If you have had past treatment for cervical cancer or a condition that could lead to cancer, you need Pap tests and screening for cancer for at least 20 years after your treatment.  If you no longer get a Pap test, assess your risk factors if they change (such as having a new sexual partner). This can affect whether you should start being screened again.  Some women have medical problems that increase their chance of getting cervical cancer. If this is the case for you, your health care provider may recommend more frequent screening and Pap tests.  The human papillomavirus (HPV) test is another test that may be used for cervical cancer screening. The HPV test looks for the virus that can cause cell changes in the cervix. The cells collected during the Pap test can be tested for HPV.  The HPV test can be used to screen women 68 years of age and older. Getting tested for HPV can extend the interval between normal Pap tests from three to five years.  An HPV test also should be used to screen women of any age who have unclear Pap test results.  After 58 years of age, women should have HPV testing as  often as Pap tests.  Colorectal Cancer  This type of cancer can be detected and often prevented.  Routine colorectal cancer screening usually begins at 58 years of age and continues through 58 years of age.  Your health care provider may recommend screening at an earlier age if you have risk factors for colon cancer.  Your health care provider may also recommend using home test kits to check for hidden blood in the stool.  A small camera at the end of a tube can be used to examine your colon directly (sigmoidoscopy or colonoscopy). This is done to check for the earliest forms of colorectal cancer.  Routine screening usually begins at age 17.  Direct examination of the colon should be repeated every 5-10 years through 58 years of age. However, you may need to be screened more often if  early forms of precancerous polyps or small growths are found. Skin Cancer  Check your skin from head to toe regularly.  Tell your health care provider about any new moles or changes in moles, especially if there is a change in a mole's shape or color.  Also tell your health care provider if you have a mole that is larger than the size of a pencil eraser.  Always use sunscreen. Apply sunscreen liberally and repeatedly throughout the day.  Protect yourself by wearing long sleeves, pants, a wide-brimmed hat, and sunglasses whenever you are outside. HEART DISEASE, DIABETES, AND HIGH BLOOD PRESSURE   Have your blood pressure checked at least every 1-2 years. High blood pressure causes heart disease and increases the risk of stroke.  If you are between 71 years and 73 years old, ask your health care provider if you should take aspirin to prevent strokes.  Have regular diabetes screenings. This involves taking a blood sample to check your fasting blood sugar level.  If you are at a normal weight and have a low risk for diabetes, have this test once every three years after 58 years of age.  If you are  overweight and have a high risk for diabetes, consider being tested at a younger age or more often. PREVENTING INFECTION  Hepatitis B  If you have a higher risk for hepatitis B, you should be screened for this virus. You are considered at high risk for hepatitis B if:  You were born in a country where hepatitis B is common. Ask your health care provider which countries are considered high risk.  Your parents were born in a high-risk country, and you have not been immunized against hepatitis B (hepatitis B vaccine).  You have HIV or AIDS.  You use needles to inject street drugs.  You live with someone who has hepatitis B.  You have had sex with someone who has hepatitis B.  You get hemodialysis treatment.  You take certain medicines for conditions, including cancer, organ transplantation, and autoimmune conditions. Hepatitis C  Blood testing is recommended for:  Everyone born from [birth year] through 1965.  Anyone with known risk factors for hepatitis C. Sexually transmitted infections (STIs)  You should be screened for sexually transmitted infections (STIs) including gonorrhea and chlamydia if:  You are sexually active and are younger than 58 years of age.  You are older than 58 years of age and your health care provider tells you that you are at risk for this type of infection.  Your sexual activity has changed since you were last screened and you are at an increased risk for chlamydia or gonorrhea. Ask your health care provider if you are at risk.  If you do not have HIV, but are at risk, it may be recommended that you take a prescription medicine daily to prevent HIV infection. This is called pre-exposure prophylaxis (PrEP). You are considered at risk if:  You are sexually active and do not regularly use condoms or know the HIV status of your partner(s).  You take drugs by injection.  You are sexually active with a partner who has HIV. Talk with your health care provider  about whether you are at high risk of being infected with HIV. If you choose to begin PrEP, you should first be tested for HIV. You should then be tested every 3 months for as long as you are taking PrEP.  PREGNANCY   If you are premenopausal and you may become pregnant, ask  your health care provider about preconception counseling.  If you may become pregnant, take 400 to 800 micrograms (mcg) of folic acid every day.  If you want to prevent pregnancy, talk to your health care provider about birth control (contraception). OSTEOPOROSIS AND MENOPAUSE   Osteoporosis is a disease in which the bones lose minerals and strength with aging. This can result in serious bone fractures. Your risk for osteoporosis can be identified using a bone density scan.  If you are 31 years of age or older, or if you are at risk for osteoporosis and fractures, ask your health care provider if you should be screened.  Ask your health care provider whether you should take a calcium or vitamin D supplement to lower your risk for osteoporosis.  Menopause may have certain physical symptoms and risks.  Hormone replacement therapy may reduce some of these symptoms and risks. Talk to your health care provider about whether hormone replacement therapy is right for you.  HOME CARE INSTRUCTIONS   Schedule regular health, dental, and eye exams.  Stay current with your immunizations.   Do not use any tobacco products including cigarettes, chewing tobacco, or electronic cigarettes.  If you are pregnant, do not drink alcohol.  If you are breastfeeding, limit how much and how often you drink alcohol.  Limit alcohol intake to no more than 1 drink per day for nonpregnant women. One drink equals 12 ounces of beer, 5 ounces of wine, or 1 ounces of hard liquor.  Do not use street drugs.  Do not share needles.  Ask your health care provider for help if you need support or information about quitting drugs.  Tell your  health care provider if you often feel depressed.  Tell your health care provider if you have ever been abused or do not feel safe at home. Document Released: 02/19/2011 Document Revised: 12/21/2013 Document Reviewed: 07/08/2013 [facility] Patient Information 2015 [place], [place]. This information is not intended to replace advice given to you by your health care provider. Make sure you discuss any questions you have with your health care provider.

## 2014-04-06 DIAGNOSIS — M25552 Pain in left hip: Secondary | ICD-10-CM | POA: Insufficient documentation

## 2014-04-06 DIAGNOSIS — N941 Unspecified dyspareunia: Secondary | ICD-10-CM | POA: Insufficient documentation

## 2014-04-06 NOTE — Assessment & Plan Note (Signed)
Annual  wellness  exam was done as well as a comprehensive physical exam and management of acute and chronic conditions .  During the course of the visit the patient was educated and counseled about appropriate screening and preventive services including : fall prevention , diabetes screening, nutrition counseling, colorectal cancer screening, and recommended immunizations.  Printed recommendations for health maintenance screenings was given.  

## 2014-04-06 NOTE — Assessment & Plan Note (Signed)
She has normal ROM on exam today. Symptoms are likely due to strain during intercourse.  Suggested stretching exercises .

## 2014-04-06 NOTE — Assessment & Plan Note (Signed)
She is at [place] due to low body weight and post menopausal status.  DEXA  Ordered

## 2014-04-06 NOTE — Assessment & Plan Note (Signed)
She is currently taking only amounts contained in her multii vitamin.  Repeat level is pending

## 2014-04-06 NOTE — Assessment & Plan Note (Signed)
Due to the lengthy list of questions and concerns, there was not sufficient time for a pelvic exam today . However her history of discomfort only occurs during pool sex, which suggests that lack of lubrication may be the cause. She will return for pelvic exam if use of  Water resistant lubricants do not help.

## 2014-04-08 LAB — CBC AND DIFFERENTIAL
HCT: 43 % (ref 36–46)
HEMOGLOBIN: 14.1 g/dL (ref 12.0–16.0)
NEUTROS ABS: 5 /uL
PLATELETS: 221 10*3/uL (ref 150–399)
WBC: 7.1 10*3/mL

## 2014-04-08 LAB — LIPID PANEL
Cholesterol: 216 mg/dL — AB (ref 0–200)
HDL: 74 mg/dL — AB (ref 35–70)
LDL CALC: 135 mg/dL
TRIGLYCERIDES: 36 mg/dL — AB (ref 40–160)

## 2014-04-08 LAB — BASIC METABOLIC PANEL
BUN: 28 mg/dL — AB (ref 4–21)
Creatinine: 0.8 mg/dL (ref 0.5–1.1)
GLUCOSE: 96 mg/dL
POTASSIUM: 4.4 mmol/L (ref 3.4–5.3)
SODIUM: 143 mmol/L (ref 137–147)

## 2014-04-08 LAB — HEPATIC FUNCTION PANEL
ALT: 28 U/L (ref 7–35)
AST: 22 U/L (ref 13–35)
Alkaline Phosphatase: 87 U/L (ref 25–125)
Bilirubin, Total: 0.4 mg/dL

## 2014-04-08 LAB — TSH: TSH: 1.52 u[IU]/mL (ref 0.41–5.90)

## 2014-04-14 ENCOUNTER — Telehealth: Payer: Self-pay | Admitting: Internal Medicine

## 2014-06-15 ENCOUNTER — Encounter: Payer: Self-pay | Admitting: Internal Medicine

## 2014-06-22 ENCOUNTER — Ambulatory Visit: Payer: Self-pay | Admitting: Internal Medicine

## 2014-06-22 LAB — HM MAMMOGRAPHY: HM MAMMO: NEGATIVE

## 2014-06-23 ENCOUNTER — Encounter: Payer: Self-pay | Admitting: *Deleted

## 2014-07-22 ENCOUNTER — Encounter: Payer: Self-pay | Admitting: Internal Medicine

## 2015-06-01 ENCOUNTER — Encounter: Payer: Self-pay | Admitting: Internal Medicine

## 2015-06-01 ENCOUNTER — Other Ambulatory Visit (HOSPITAL_COMMUNITY)
Admission: RE | Admit: 2015-06-01 | Discharge: 2015-06-01 | Disposition: A | Discharge status: Home / Self Care | Payer: No Typology Code available for payment source | Source: Ambulatory Visit | Attending: Internal Medicine | Admitting: Internal Medicine

## 2015-06-01 ENCOUNTER — Ambulatory Visit (INDEPENDENT_AMBULATORY_CARE_PROVIDER_SITE_OTHER): Payer: No Typology Code available for payment source | Admitting: Internal Medicine

## 2015-06-01 VITALS — BP 120/78 | HR 82 | Temp 98.4°F | Resp 12 | Ht 60.0 in | Wt 100.0 lb

## 2015-06-01 DIAGNOSIS — Z01419 Encounter for gynecological examination (general) (routine) without abnormal findings: Secondary | ICD-10-CM | POA: Diagnosis present

## 2015-06-01 DIAGNOSIS — Z1151 Encounter for screening for human papillomavirus (HPV): Secondary | ICD-10-CM | POA: Insufficient documentation

## 2015-06-01 DIAGNOSIS — Z124 Encounter for screening for malignant neoplasm of cervix: Secondary | ICD-10-CM | POA: Diagnosis not present

## 2015-06-01 DIAGNOSIS — Z0000 Encounter for general adult medical examination without abnormal findings: Secondary | ICD-10-CM | POA: Diagnosis not present

## 2015-06-01 DIAGNOSIS — Z1239 Encounter for other screening for malignant neoplasm of breast: Secondary | ICD-10-CM

## 2015-06-01 NOTE — Patient Instructions (Signed)
 You may want to try the "Orgain"  Brand of organic almond milk because it has more protein (10 mg per 8 ounce, compared to 1 gram/8 ounce) than the other brands of almond milk . It has the same amount of calcium as a glass of milk and is cholesterol free and low calorie.  Its available at [employer] and at the Co [employer]   Health Maintenance, Female Adopting a healthy lifestyle and getting preventive care can go a long way to promote health and wellness. Talk with your health care provider about what schedule of regular examinations is right for you. This is a good chance for you to check in with your provider about disease prevention and staying healthy. In between checkups, there are plenty of things you can do on your own. Experts have done a lot of research about which lifestyle changes and preventive measures are most likely to keep you healthy. Ask your health care provider for more information. WEIGHT AND DIET  Eat a healthy diet  Be sure to include plenty of vegetables, fruits, low-fat dairy products, and lean protein.  Do not eat a lot of foods high in solid fats, added sugars, or salt.  Get regular exercise. This is one of the most important things you can do for your health.  Most adults should exercise for at least 150 minutes each week. The exercise should increase your heart rate and make you sweat (moderate-intensity exercise).  Most adults should also do strengthening exercises at least twice a week. This is in addition to the moderate-intensity exercise.  Maintain a healthy weight  Body mass index (BMI) is a measurement that can be used to identify possible weight problems. It estimates body fat based on height and weight. Your health care provider can help determine your BMI and help you achieve or maintain a healthy weight.  For females 20 years of age and older:   A BMI below 18.5 is considered underweight.  A BMI of 18.5 to 24.9 is normal.  A BMI of 25 to 29.9 is  considered overweight.  A BMI of 30 and above is considered obese.  Watch levels of cholesterol and blood lipids  You should start having your blood tested for lipids and cholesterol at 59 years of age, then have this test every 5 years.  You may need to have your cholesterol levels checked more often if:  Your lipid or cholesterol levels are high.  You are older than 59 years of age.  You are at high risk for heart disease.  CANCER SCREENING   Lung Cancer  Lung cancer screening is recommended for adults 55-80 years old who are at high risk for lung cancer because of a history of smoking.  A yearly low-dose CT scan of the lungs is recommended for people who:  Currently smoke.  Have quit within the past 15 years.  Have at least a 30-pack-year history of smoking. A pack year is smoking an average of one pack of cigarettes a day for 1 year.  Yearly screening should continue until it has been 15 years since you quit.  Yearly screening should stop if you develop a health problem that would prevent you from having lung cancer treatment.  Breast Cancer  Practice breast self-awareness. This means understanding how your breasts normally appear and feel.  It also means doing regular breast self-exams. Let your health care provider know about any changes, no matter how small.  If you are in your   or 29s, you should have a clinical breast exam (CBE) by a health care provider every 1-3 years as part of a regular health exam.  If you are 54 or older, have a CBE every year. Also consider having a breast X-ray (mammogram) every year.  If you have a family history of breast cancer, talk to your health care provider about genetic screening.  If you are at high risk for breast cancer, talk to your health care provider about having an MRI and a mammogram every year.  Breast cancer gene (BRCA) assessment is recommended for women who have family members with BRCA-related cancers. BRCA-related  cancers include:  Breast.  Ovarian.  Tubal.  Peritoneal cancers.  Results of the assessment will determine the need for genetic counseling and BRCA1 and BRCA2 testing. Cervical Cancer Your health care provider may recommend that you be screened regularly for cancer of the pelvic organs (ovaries, uterus, and vagina). This screening involves a pelvic examination, including checking for microscopic changes to the surface of your cervix (Pap test). You may be encouraged to have this screening done every 3 years, beginning at age 41.  For women ages 49-65, health care providers may recommend pelvic exams and Pap testing every 3 years, or they may recommend the Pap and pelvic exam, combined with testing for human papilloma virus (HPV), every 5 years. Some types of HPV increase your risk of cervical cancer. Testing for HPV may also be done on women of any age with unclear Pap test results.  Other health care providers may not recommend any screening for nonpregnant women who are considered low risk for pelvic cancer and who do not have symptoms. Ask your health care provider if a screening pelvic exam is right for you.  If you have had past treatment for cervical cancer or a condition that could lead to cancer, you need Pap tests and screening for cancer for at least 20 years after your treatment. If Pap tests have been discontinued, your risk factors (such as having a new sexual partner) need to be reassessed to determine if screening should resume. Some women have medical problems that increase the chance of getting cervical cancer. In these cases, your health care provider may recommend more frequent screening and Pap tests. Colorectal Cancer  This type of cancer can be detected and often prevented.  Routine colorectal cancer screening usually begins at 60 years of age and continues through 59 years of age.  Your health care provider may recommend screening at an earlier age if you have risk  factors for colon cancer.  Your health care provider may also recommend using home test kits to check for hidden blood in the stool.  A small camera at the end of a tube can be used to examine your colon directly (sigmoidoscopy or colonoscopy). This is done to check for the earliest forms of colorectal cancer.  Routine screening usually begins at age 19.  Direct examination of the colon should be repeated every 5-10 years through 59 years of age. However, you may need to be screened more often if early forms of precancerous polyps or small growths are found. Skin Cancer  Check your skin from head to toe regularly.  Tell your health care provider about any new moles or changes in moles, especially if there is a change in a mole's shape or color.  Also tell your health care provider if you have a mole that is larger than the size of a pencil eraser.  Always use sunscreen. Apply sunscreen liberally and repeatedly throughout the day.  Protect yourself by wearing long sleeves, pants, a wide-brimmed hat, and sunglasses whenever you are outside. HEART DISEASE, DIABETES, AND HIGH BLOOD PRESSURE   High blood pressure causes heart disease and increases the risk of stroke. High blood pressure is more likely to develop in:  People who have blood pressure in the high end of the normal range (130-139/85-89 mm Hg).  People who are overweight or obese.  People who are African American.  If you are 13-40 years of age, have your blood pressure checked every 3-5 years. If you are 37 years of age or older, have your blood pressure checked every year. You should have your blood pressure measured twice--once when you are at a hospital or clinic, and once when you are not at a hospital or clinic. Record the average of the two measurements. To check your blood pressure when you are not at a hospital or clinic, you can use:  An automated blood pressure machine at a pharmacy.  A home blood pressure  monitor.  If you are between 39 years and 80 years old, ask your health care provider if you should take aspirin to prevent strokes.  Have regular diabetes screenings. This involves taking a blood sample to check your fasting blood sugar level.  If you are at a normal weight and have a low risk for diabetes, have this test once every three years after 59 years of age.  If you are overweight and have a high risk for diabetes, consider being tested at a younger age or more often. PREVENTING INFECTION  Hepatitis B  If you have a higher risk for hepatitis B, you should be screened for this virus. You are considered at high risk for hepatitis B if:  You were born in a country where hepatitis B is common. Ask your health care provider which countries are considered high risk.  Your parents were born in a high-risk country, and you have not been immunized against hepatitis B (hepatitis B vaccine).  You have HIV or AIDS.  You use needles to inject street drugs.  You live with someone who has hepatitis B.  You have had sex with someone who has hepatitis B.  You get hemodialysis treatment.  You take certain medicines for conditions, including cancer, organ transplantation, and autoimmune conditions. Hepatitis C  Blood testing is recommended for:  Everyone born from [birth year] through 1965.  Anyone with known risk factors for hepatitis C. Sexually transmitted infections (STIs)  You should be screened for sexually transmitted infections (STIs) including gonorrhea and chlamydia if:  You are sexually active and are younger than 59 years of age.  You are older than 59 years of age and your health care provider tells you that you are at risk for this type of infection.  Your sexual activity has changed since you were last screened and you are at an increased risk for chlamydia or gonorrhea. Ask your health care provider if you are at risk.  If you do not have HIV, but are at risk, it may be  recommended that you take a prescription medicine daily to prevent HIV infection. This is called pre-exposure prophylaxis (PrEP). You are considered at risk if:  You are sexually active and do not regularly use condoms or know the HIV status of your partner(s).  You take drugs by injection.  You are sexually active with a partner who has HIV. Talk with your health  care provider about whether you are at high risk of being infected with HIV. If you choose to begin PrEP, you should first be tested for HIV. You should then be tested every 3 months for as long as you are taking PrEP.  PREGNANCY   If you are premenopausal and you may become pregnant, ask your health care provider about preconception counseling.  If you may become pregnant, take 400 to 800 micrograms (mcg) of folic acid every day.  If you want to prevent pregnancy, talk to your health care provider about birth control (contraception). OSTEOPOROSIS AND MENOPAUSE   Osteoporosis is a disease in which the bones lose minerals and strength with aging. This can result in serious bone fractures. Your risk for osteoporosis can be identified using a bone density scan.  If you are 65 years of age or older, or if you are at risk for osteoporosis and fractures, ask your health care provider if you should be screened.  Ask your health care provider whether you should take a calcium or vitamin D supplement to lower your risk for osteoporosis.  Menopause may have certain physical symptoms and risks.  Hormone replacement therapy may reduce some of these symptoms and risks. Talk to your health care provider about whether hormone replacement therapy is right for you.  HOME CARE INSTRUCTIONS   Schedule regular health, dental, and eye exams.  Stay current with your immunizations.   Do not use any tobacco products including cigarettes, chewing tobacco, or electronic cigarettes.  If you are pregnant, do not drink alcohol.  If you are  breastfeeding, limit how much and how often you drink alcohol.  Limit alcohol intake to no more than 1 drink per day for nonpregnant women. One drink equals 12 ounces of beer, 5 ounces of wine, or 1 ounces of hard liquor.  Do not use street drugs.  Do not share needles.  Ask your health care provider for help if you need support or information about quitting drugs.  Tell your health care provider if you often feel depressed.  Tell your health care provider if you have ever been abused or do not feel safe at home.   This information is not intended to replace advice given to you by your health care provider. Make sure you discuss any questions you have with your health care provider.   Document Released: 02/19/2011 Document Revised: 08/27/2014 Document Reviewed: 07/08/2013 Elsevier Interactive Patient Education 2016 [employer]    

## 2015-06-01 NOTE — Progress Notes (Signed)
Pre-visit discussion using our clinic review tool. No additional management support is needed unless otherwise documented below in the visit note.  

## 2015-06-01 NOTE — Progress Notes (Signed)
Patient ID: Debbie Sanchez, female    DOB: 08/20/1956  Age: 59 y.o. MRN: [id number]  The patient is here for annual Medicare wellness examination and management of other chronic and acute problems.   PAP due today 5 yr colonoscopy for FH due 2018 Mammogram annual due Nov 2016  Taking Vit D 5000 IU every other day  The risk factors are reflected in the social history.  The roster of all physicians providing medical care to patient - is listed in the Snapshot section of the chart. Home safety : The patient has smoke detectors in the home. They wear seatbelts.  There are no firearms at home. There is no violence in the home.   There is no risks for hepatitis, STDs or HIV. There is no   history of blood transfusion. They have no travel history to infectious disease endemic areas of the world.  The patient has seen their dentist in the last six month. They have seen their eye doctor in the last year. They admit to slight hearing difficulty with regard to whispered voices and some television programs.  They have deferred audiologic testing in the last year.  They do not  have excessive sun exposure. Discussed the need for sun protection: hats, long sleeves and use of sunscreen if there is significant sun exposure.   Diet: the importance of a healthy diet is discussed. They do have a healthy diet.  The benefits of regular aerobic exercise were discussed. She walks 4 times per week ,  20 minutes.   Depression screen: there are no signs or vegative symptoms of depression- irritability, change in appetite, anhedonia, sadness/tearfullness.  The following portions of the patient's history were reviewed and updated as appropriate: allergies, current medications, past family history, past medical history,  past surgical history, past social history  and problem list.  Visual acuity was not assessed per patient preference since she has regular follow up with her ophthalmologist. Hearing and body mass index  were assessed and reviewed.   During the course of the visit the patient was educated and counseled about appropriate screening and preventive services including : fall prevention , diabetes screening, nutrition counseling, colorectal cancer screening, and recommended immunizations.    CC: The primary encounter diagnosis was Breast cancer screening. Diagnoses of Cervical cancer screening and Encounter for preventive health examination were also pertinent to this visit.  History Peyson has a past medical history of Fibrocystic breast disease and Menopause.   She has past surgical history that includes Tubal ligation; Cesarean section (1998); and Mouth surgery.   Her family history includes Cancer in her maternal aunt and mother.She reports that she has never smoked. She has never used smokeless tobacco. She reports that she drinks alcohol. She reports that she does not use illicit drugs.  Outpatient Prescriptions Prior to Visit  Medication Sig Dispense Refill  . Ascorbic Acid (VITAMIN C) 100 MG tablet Take 1,000 mg by mouth 2 (two) times daily.     . Calcium Carbonate (CALCIUM 500 PO) Take 1 tablet by mouth daily.    . Diphenhydramine-APAP, sleep, (TYLENOL PM EXTRA STRENGTH PO) Take by mouth. OTC take as directed.    . IBUPROFEN PO Take by mouth as directed.    Marland Kitchen MELATONIN ER PO Take by mouth.    . Multiple Vitamin (MULTIVITAMIN) tablet Take 1 tablet by mouth daily.    . Cholecalciferol (VITAMIN D3) 5000 UNITS CAPS Take 1 capsule by mouth 2 (two) times daily.  No facility-administered medications prior to visit.    Review of Systems   Patient denies headache, fevers, malaise, unintentional weight loss, skin rash, eye pain, sinus congestion and sinus pain, sore throat, dysphagia,  hemoptysis , cough, dyspnea, wheezing, chest pain, palpitations, orthopnea, edema, abdominal pain, nausea, melena, diarrhea, constipation, flank pain, dysuria, hematuria, urinary  Frequency, nocturia, numbness,  tingling, seizures,  Focal weakness, Loss of consciousness,  Tremor, insomnia, depression, anxiety, and suicidal ideation.      Objective:  BP 120/78 mmHg  Pulse 82  Temp(Src) 98.4 F (36.9 C) (Oral)  Resp 12  Ht 5' (1.524 m)  Wt 100 lb (45.36 kg)  BMI 19.53 kg/m2  SpO2 99%  Physical Exam   General Appearance:    Alert, cooperative, no distress, appears stated age  Head:    Normocephalic, without obvious abnormality, atraumatic  Eyes:    PERRL, conjunctiva/corneas clear, EOM's intact, fundi    benign, both eyes  Ears:    Normal TM's and external ear canals, both ears  Nose:   Nares normal, septum midline, mucosa normal, no drainage    or sinus tenderness  Throat:   Lips, mucosa, and tongue normal; teeth and gums normal  Neck:   Supple, symmetrical, trachea midline, no adenopathy;    thyroid:  no enlargement/tenderness/nodules; no carotid   bruit or JVD  Back:     Symmetric, no curvature, ROM normal, no CVA tenderness  Lungs:     Clear to auscultation bilaterally, respirations unlabored  Chest Wall:    No tenderness or deformity   Heart:    Regular rate and rhythm, S1 and S2 normal, no murmur, rub   or gallop  Breast Exam:    No tenderness, masses, or nipple abnormality  Abdomen:     Soft, non-tender, bowel sounds active all four quadrants,    no masses, no organomegaly  Genitalia:    Pelvic: cervix normal in appearance, external genitalia normal, no adnexal masses or tenderness, no cervical motion tenderness, rectovaginal septum normal, uterus normal size, shape, and consistency and vagina normal without discharge  Extremities:   Extremities normal, atraumatic, no cyanosis or edema  Pulses:   2+ and symmetric all extremities  Skin:   Skin color, texture, turgor normal, no rashes or lesions  Lymph nodes:   Cervical, supraclavicular, and axillary nodes normal  Neurologic:   CNII-XII intact, normal strength, sensation and reflexes    throughout        Assessment & Plan:    Problem List Items Addressed This Visit    Encounter for preventive health examination    Annual wellness  exam was done as well as a comprehensive physical exam  .  During the course of the visit the patient was educated and counseled about appropriate screening and preventive services and screenings were brought up to date for cervical and breast cancer .  She will return for fasting labs to provide samples for diabetes screening and lipid analysis with projected  10 year  risk for CAD. nutrition counseling, skin cancer screening has been recommended, along with review of the age appropriate recommended immunizations.  Printed recommendations for health maintenance screenings was given.         Other Visit Diagnoses    Breast cancer screening    -  Primary    Relevant Orders    MM DIGITAL SCREENING BILATERAL    Cervical cancer screening        Relevant Orders    Cytology - PAP (Completed)  I have discontinued Ms. Hyams's Vitamin D3. I am also having her maintain her (Diphenhydramine-APAP, sleep, (TYLENOL PM EXTRA STRENGTH PO)), IBUPROFEN PO, multivitamin, Calcium Carbonate (CALCIUM 500 PO), vitamin C, and MELATONIN ER PO.  No orders of the defined types were placed in this encounter.    Medications Discontinued During This Encounter  Medication Reason  . Cholecalciferol (VITAMIN D3) 5000 UNITS CAPS     Follow-up: No Follow-up on file.   Crecencio Mc, MD

## 2015-06-03 LAB — CYTOLOGY - PAP

## 2015-06-04 NOTE — Assessment & Plan Note (Signed)

## 2015-06-05 ENCOUNTER — Encounter: Payer: Self-pay | Admitting: Internal Medicine

## 2015-06-06 ENCOUNTER — Other Ambulatory Visit: Payer: Self-pay

## 2015-06-06 DIAGNOSIS — Z0000 Encounter for general adult medical examination without abnormal findings: Secondary | ICD-10-CM

## 2015-06-06 NOTE — Progress Notes (Signed)
Send results to Dr. Derrel Nip

## 2015-06-07 LAB — CMP12+LP+TP+TSH+6AC+CBC/D/PLT
A/G RATIO: 1.9 (ref 1.1–2.5)
ALBUMIN: 4.2 g/dL (ref 3.5–5.5)
ALT: 22 IU/L (ref 0–32)
AST: 21 IU/L (ref 0–40)
Alkaline Phosphatase: 84 IU/L (ref 39–117)
BASOS ABS: 0 10*3/uL (ref 0.0–0.2)
BUN/Creatinine Ratio: 19 (ref 9–23)
BUN: 13 mg/dL (ref 6–24)
Basos: 0 %
Bilirubin Total: 0.6 mg/dL (ref 0.0–1.2)
CALCIUM: 9.5 mg/dL (ref 8.7–10.2)
CHOLESTEROL TOTAL: 197 mg/dL (ref 100–199)
Chloride: 103 mmol/L (ref 97–106)
Chol/HDL Ratio: 2.6 ratio units (ref 0.0–4.4)
Creatinine, Ser: 0.67 mg/dL (ref 0.57–1.00)
EOS (ABSOLUTE): 0.1 10*3/uL (ref 0.0–0.4)
Eos: 2 %
Estimated CHD Risk: <0.5 times avg. (ref 0.0–1.0)
FREE THYROXINE INDEX: 2.6 (ref 1.2–4.9)
GFR calc non Af Amer: 97 mL/min/{1.73_m2} (ref >59)
GFR, EST AFRICAN AMERICAN: 111 mL/min/{1.73_m2} (ref >59)
GGT: 20 IU/L (ref 0–60)
GLOBULIN, TOTAL: 2.2 g/dL (ref 1.5–4.5)
Glucose: 100 mg/dL — ABNORMAL HIGH (ref 65–99)
HDL: 77 mg/dL (ref >39)
Hematocrit: 43.4 % (ref 34.0–46.6)
Hemoglobin: 14.2 g/dL (ref 11.1–15.9)
IMMATURE GRANS (ABS): 0 10*3/uL (ref 0.0–0.1)
IRON: 128 ug/dL (ref 27–159)
Immature Granulocytes: 0 %
LDH: 154 IU/L (ref 119–226)
LDL Calculated: 111 mg/dL — ABNORMAL HIGH (ref 0–99)
LYMPHS: 29 %
Lymphocytes Absolute: 1.7 10*3/uL (ref 0.7–3.1)
MCH: 30 pg (ref 26.6–33.0)
MCHC: 32.7 g/dL (ref 31.5–35.7)
MCV: 92 fL (ref 79–97)
MONOCYTES: 9 %
MONOS ABS: 0.5 10*3/uL (ref 0.1–0.9)
NEUTROS ABS: 3.5 10*3/uL (ref 1.4–7.0)
Neutrophils: 60 %
PHOSPHORUS: 4.1 mg/dL (ref 2.5–4.5)
PLATELETS: 246 10*3/uL (ref 150–379)
POTASSIUM: 4.6 mmol/L (ref 3.5–5.2)
RBC: 4.73 x10E6/uL (ref 3.77–5.28)
RDW: 12.5 % (ref 12.3–15.4)
Sodium: 143 mmol/L (ref 136–144)
T3 UPTAKE RATIO: 29 % (ref 24–39)
T4 TOTAL: 9 ug/dL (ref 4.5–12.0)
TRIGLYCERIDES: 47 mg/dL (ref 0–149)
TSH: 1.98 u[IU]/mL (ref 0.450–4.500)
Total Protein: 6.4 g/dL (ref 6.0–8.5)
Uric Acid: 3.6 mg/dL (ref 2.5–7.1)
VLDL Cholesterol Cal: 9 mg/dL (ref 5–40)
WBC: 5.9 10*3/uL (ref 3.4–10.8)

## 2015-06-07 LAB — HIV ANTIBODY (ROUTINE TESTING W REFLEX): HIV SCREEN 4TH GENERATION: NONREACTIVE

## 2015-06-07 LAB — VITAMIN D 25 HYDROXY (VIT D DEFICIENCY, FRACTURES): Vit D, 25-Hydroxy: 69.8 ng/mL (ref 30.0–100.0)

## 2015-06-07 LAB — HEPATITIS C ANTIBODY (REFLEX)

## 2015-06-07 LAB — HCV COMMENT:

## 2015-06-11 ENCOUNTER — Telehealth: Payer: Self-pay | Admitting: Internal Medicine

## 2015-06-11 NOTE — Telephone Encounter (Signed)
Thank you for abstracting. MyChart message sent

## 2015-06-22 ENCOUNTER — Encounter: Payer: Self-pay | Admitting: Internal Medicine

## 2015-06-24 ENCOUNTER — Ambulatory Visit
Admission: RE | Admit: 2015-06-24 | Discharge: 2015-06-24 | Disposition: A | Discharge status: Home / Self Care | Payer: No Typology Code available for payment source | Source: Ambulatory Visit | Attending: Internal Medicine | Admitting: Internal Medicine

## 2015-06-24 ENCOUNTER — Other Ambulatory Visit: Payer: Self-pay | Admitting: Internal Medicine

## 2015-06-24 DIAGNOSIS — Z1231 Encounter for screening mammogram for malignant neoplasm of breast: Secondary | ICD-10-CM | POA: Insufficient documentation

## 2015-06-24 DIAGNOSIS — Z1239 Encounter for other screening for malignant neoplasm of breast: Secondary | ICD-10-CM

## 2016-03-05 ENCOUNTER — Ambulatory Visit: Payer: Self-pay | Admitting: Physician Assistant

## 2016-03-05 VITALS — BP 101/69 | HR 80 | Temp 98.1°F

## 2016-03-05 DIAGNOSIS — N3001 Acute cystitis with hematuria: Secondary | ICD-10-CM

## 2016-03-05 LAB — POCT URINALYSIS DIPSTICK
Bilirubin, UA: NEGATIVE
Glucose, UA: NEGATIVE
KETONES UA: NEGATIVE
Nitrite, UA: NEGATIVE
PH UA: 5.5
UROBILINOGEN UA: 0.2

## 2016-03-05 MED ORDER — CEPHALEXIN 500 MG PO CAPS: 500.0000 mg | ORAL_CAPSULE | Freq: Three times a day (TID) | ORAL | Status: DC

## 2016-03-05 NOTE — Addendum Note (Signed)
Addended by: Rudene Anda T on: 03/05/2016 11:19 AM   Modules accepted: Orders

## 2016-03-05 NOTE — Progress Notes (Signed)
S:  Woke this am with dysuria, hematuria, frequency.  Hx of UTI with last one year ago.   O:  Strip + leuks, Blood 3+ A:  UTI  P: keflex 500mg  tid and pt will call if diflucan is needed

## 2016-06-05 ENCOUNTER — Ambulatory Visit (INDEPENDENT_AMBULATORY_CARE_PROVIDER_SITE_OTHER): Payer: Managed Care, Other (non HMO) | Admitting: Internal Medicine

## 2016-06-05 ENCOUNTER — Encounter: Payer: Self-pay | Admitting: Internal Medicine

## 2016-06-05 VITALS — BP 100/68 | HR 76 | Temp 98.0°F | Resp 12 | Ht 60.0 in | Wt 99.8 lb

## 2016-06-05 DIAGNOSIS — Z1239 Encounter for other screening for malignant neoplasm of breast: Secondary | ICD-10-CM

## 2016-06-05 DIAGNOSIS — Z1231 Encounter for screening mammogram for malignant neoplasm of breast: Secondary | ICD-10-CM

## 2016-06-05 DIAGNOSIS — G4762 Sleep related leg cramps: Secondary | ICD-10-CM | POA: Diagnosis not present

## 2016-06-05 DIAGNOSIS — Z0000 Encounter for general adult medical examination without abnormal findings: Secondary | ICD-10-CM | POA: Diagnosis not present

## 2016-06-05 DIAGNOSIS — G250 Essential tremor: Secondary | ICD-10-CM

## 2016-06-05 DIAGNOSIS — H8110 Benign paroxysmal vertigo, unspecified ear: Secondary | ICD-10-CM

## 2016-06-05 MED ORDER — PROMETHAZINE HCL 12.5 MG PO TABS: 12.5000 mg | ORAL_TABLET | Freq: Three times a day (TID) | ORAL | 0 refills | Status: DC | PRN

## 2016-06-05 NOTE — Progress Notes (Signed)
Patient ID: Debbie Sanchez, female    DOB: 1955/10/17  Age: 60 y.o. MRN: [id number]  The patient is here for annual wellness examination and management of other chronic and acute problems.    PAP smear 2016 Mammogram normal  Nov 2016  Needs mon wed or Friday  Anytime after nov 21  Colonoscopy 2013  Normal.  FH Colon CA 5  YR FOLLOW UP DUE in 2018      The risk factors are reflected in the social history.  The roster of all physicians providing medical care to patient - is listed in the Snapshot section of the chart.   Home safety : The patient has smoke detectors in the home. They wear seatbelts.  There are no firearms at home. There is no violence in the home.   There is no risks for hepatitis, STDs or HIV. There is no   history of blood transfusion. They have no travel history to infectious disease endemic areas of the world.  The patient has seen their dentist in the last six month. They have seen their eye doctor in the last year.   They do not  have excessive sun exposure. Discussed the need for sun protection: hats, long sleeves and use of sunscreen if there is significant sun exposure.   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 They do have a healthy diet.  The benefits of regular aerobic exercise were discussed. She walks 4 times per week ,  20 minutes.   Depression screen: there are no signs or vegative symptoms of depression- irritability, change in appetite, anhedonia, sadness/tearfullness.  The following portions of the patient's history were reviewed and updated as appropriate: allergies, current medications, past family history, past medical history,  past surgical history, past social history  and problem list.  Visual acuity was not assessed per patient preference since she has regular follow up with her ophthalmologist. Hearing and body mass index were assessed and  reviewed.   During the course of the visit the patient was educated and counseled about appropriate screening and preventive services including : fall prevention , diabetes screening, nutrition counseling, colorectal cancer screening, and recommended immunizations.    CC: The primary encounter diagnosis was Benign essential tremor. Diagnoses of Encounter for preventive health examination, Breast cancer screening, Benign paroxysmal positional vertigo, unspecified laterality, and Nocturnal leg cramps were also pertinent to this visit. Has a list of concerns  1) 1 yr history of right shoulder pain intermittent,  Aggravated by carrying heavy bags of clay.  No radiation nto arm.  muscle strain 2) Shrinking lump in left forearm  3) Periodic cramps in legs,  Feet and toes .  Drinks a lot of coffee and tea, notmuch water.  4) Had a dizzy spell that lasted 1 hour ,  Nausea,vomiting  One morning.   Chronic orthostasis  5) Tremor . First noticed it 10 years ago , more pronounced in the morning . Hands only    [name] has a past medical history of Fibrocystic breast disease and Menopause.   She has a past surgical history that includes Tubal ligation; Cesarean section (1998); Mouth surgery; Breast cyst aspiration (Right, 2002); and Breast biopsy (Right, 2010).   Her family history includes Cancer in her maternal aunt and mother.She reports that she has never smoked. She has never used smokeless tobacco. She reports that  she drinks alcohol. She reports that she does not use drugs.  Outpatient Medications Prior to Visit  Medication Sig Dispense Refill  . Ascorbic Acid (VITAMIN C) 100 MG tablet Take 1,000 mg by mouth 2 (two) times daily.     . Calcium Carbonate (CALCIUM 500 PO) Take 1 tablet by mouth daily.    . IBUPROFEN PO Take by mouth as directed.    Marland Kitchen MELATONIN ER PO Take by mouth.    . Multiple Vitamin (MULTIVITAMIN) tablet Take 1 tablet by mouth daily.    . Diphenhydramine-APAP, sleep,  (TYLENOL PM EXTRA STRENGTH PO) Take by mouth. OTC take as directed.    . cephALEXin (KEFLEX) 500 MG capsule Take 1 capsule (500 mg total) by mouth 3 (three) times daily. 30 capsule 0   No facility-administered medications prior to visit.     Review of Systems   Patient denies headache, fevers, malaise, unintentional weight loss, skin rash, eye pain, sinus congestion and sinus pain, sore throat, dysphagia,  hemoptysis , cough, dyspnea, wheezing, chest pain, palpitations, orthopnea, edema, abdominal pain, nausea, melena, diarrhea, constipation, flank pain, dysuria, hematuria, urinary  Frequency, nocturia, numbness, tingling, seizures,  Focal weakness, Loss of consciousness,  Tremor, insomnia, depression, anxiety, and suicidal ideation.      Objective:  BP 100/68   Pulse 76   Temp 98 F (36.7 C) (Oral)   Resp 12   Ht 5' (1.524 m)   Wt 99 lb 12 oz (45.2 kg)   SpO2 100%   BMI 19.48 kg/m   Physical Exam   General appearance: alert, cooperative and appears stated age Head: Normocephalic, without obvious abnormality, atraumatic Eyes: conjunctivae/corneas clear. PERRL, EOM's intact. Fundi benign. Ears: normal TM's and external ear canals both ears Nose: Nares normal. Septum midline. Mucosa normal. No drainage or sinus tenderness. Throat: lips, mucosa, and tongue normal; teeth and gums normal Neck: no adenopathy, no carotid bruit, no JVD, supple, symmetrical, trachea midline and thyroid not enlarged, symmetric, no tenderness/mass/nodules Lungs: clear to auscultation bilaterally Breasts: normal appearance, no masses or tenderness Heart: regular rate and rhythm, S1, S2 normal, no murmur, click, rub or gallop Abdomen: soft, non-tender; bowel sounds normal; no masses,  no organomegaly Extremities: extremities normal, atraumatic, no cyanosis or edema Pulses: 2+ and symmetric Skin: Skin color, texture, turgor normal. No rashes or lesions Neurologic: Alert and oriented X 3, normal strength and  tone. Normal symmetric reflexes. Normal coordination and gait.     Assessment & Plan:   Problem List Items Addressed This Visit    Encounter for preventive health examination    Annual comprehensive preventive exam was done as well as an evaluation and management of chronic conditions .  During the course of the visit the patient was educated and counseled about appropriate screening and preventive services including :  diabetes screening, lipid analysis with projected  10 year  risk for CAD , nutrition counseling, breast, cervical and colorectal cancer screening, and recommended immunizations.  Printed recommendations for health maintenance screenings was given.      Benign essential tremor - Primary    suggested by history,  No tremor on exam today.  Neuro exam normal. May be  Brought on by muscle fatigue      Benign paroxysmal positional vertigo    Recent episode discussed in detail regarding cause and treatment.      Nocturnal leg cramps    Recommended increase in water intake and use of turmeric. Checking TSH and lytes.    Marland Kitchen  Other Visit Diagnoses    Breast cancer screening       Relevant Orders   MM DIGITAL SCREENING BILATERAL      I have discontinued Ms. Durfey's cephALEXin. I am also having her start on promethazine. Additionally, I am having her maintain her (Diphenhydramine-APAP, sleep, (TYLENOL PM EXTRA STRENGTH PO)), IBUPROFEN PO, multivitamin, Calcium Carbonate (CALCIUM 500 PO), vitamin C, and MELATONIN ER PO.  Meds ordered this encounter  Medications  . promethazine (PHENERGAN) 12.5 MG tablet    Sig: Take 1 tablet (12.5 mg total) by mouth every 8 (eight) hours as needed for nausea or vomiting.    Dispense:  20 tablet    Refill:  0    Medications Discontinued During This Encounter  Medication Reason  . cephALEXin (KEFLEX) 500 MG capsule Patient Preference    Follow-up: No Follow-up on file.   Crecencio Mc, MD

## 2016-06-05 NOTE — Progress Notes (Signed)
Pre-visit discussion using our clinic review tool. No additional management support is needed unless otherwise documented below in the visit note.  

## 2016-06-05 NOTE — Patient Instructions (Signed)

## 2016-06-06 DIAGNOSIS — G250 Essential tremor: Secondary | ICD-10-CM | POA: Insufficient documentation

## 2016-06-06 DIAGNOSIS — G4762 Sleep related leg cramps: Secondary | ICD-10-CM | POA: Insufficient documentation

## 2016-06-06 DIAGNOSIS — H8110 Benign paroxysmal vertigo, unspecified ear: Secondary | ICD-10-CM | POA: Insufficient documentation

## 2016-06-06 NOTE — Assessment & Plan Note (Signed)
Annual comprehensive preventive exam was done as well as an evaluation and management of chronic conditions .  During the course of the visit the patient was educated and counseled about appropriate screening and preventive services including :  diabetes screening, lipid analysis with projected  10 year  risk for CAD , nutrition counseling, breast, cervical and colorectal cancer screening, and recommended immunizations.  Printed recommendations for health maintenance screenings was given 

## 2016-06-06 NOTE — Assessment & Plan Note (Signed)
suggested by history,  No tremor on exam today.  Neuro exam normal. May be  Brought on by muscle fatigue 

## 2016-06-06 NOTE — Assessment & Plan Note (Signed)
Recommended increase in water intake and use of turmeric. Checking TSH and lytes.    Marland Kitchen

## 2016-06-06 NOTE — Assessment & Plan Note (Signed)
Recent episode discussed in detail regarding cause and treatment.

## 2016-06-14 ENCOUNTER — Encounter: Payer: Self-pay | Admitting: Internal Medicine

## 2016-06-15 ENCOUNTER — Other Ambulatory Visit: Payer: Self-pay

## 2016-06-15 DIAGNOSIS — Z299 Encounter for prophylactic measures, unspecified: Secondary | ICD-10-CM

## 2016-06-15 NOTE — Progress Notes (Signed)
Patient came in to have blood drawn for testing per Dr. Tullo's orders. 

## 2016-06-16 LAB — CMP12+LP+TP+TSH+6AC+CBC/D/PLT
ALBUMIN: 4.4 g/dL (ref 3.6–4.8)
ALK PHOS: 90 IU/L (ref 39–117)
ALT: 37 IU/L — AB (ref 0–32)
AST: 31 IU/L (ref 0–40)
Albumin/Globulin Ratio: 1.8 (ref 1.2–2.2)
BASOS: 0 %
BUN/Creatinine Ratio: 26 (ref 12–28)
BUN: 20 mg/dL (ref 8–27)
Basophils Absolute: 0 10*3/uL (ref 0.0–0.2)
Bilirubin Total: 0.5 mg/dL (ref 0.0–1.2)
CALCIUM: 9.2 mg/dL (ref 8.7–10.3)
CHOLESTEROL TOTAL: 231 mg/dL — AB (ref 100–199)
Chloride: 102 mmol/L (ref 96–106)
Chol/HDL Ratio: 3.1 ratio units (ref 0.0–4.4)
Creatinine, Ser: 0.76 mg/dL (ref 0.57–1.00)
EOS (ABSOLUTE): 0.1 10*3/uL (ref 0.0–0.4)
Eos: 2 %
FREE THYROXINE INDEX: 2.3 (ref 1.2–4.9)
GFR calc Af Amer: 99 mL/min/{1.73_m2} (ref >59)
GFR calc non Af Amer: 86 mL/min/{1.73_m2} (ref >59)
GGT: 17 IU/L (ref 0–60)
GLOBULIN, TOTAL: 2.4 g/dL (ref 1.5–4.5)
Glucose: 94 mg/dL (ref 65–99)
HDL: 75 mg/dL (ref >39)
Hematocrit: 42.4 % (ref 34.0–46.6)
Hemoglobin: 13.9 g/dL (ref 11.1–15.9)
IMMATURE GRANS (ABS): 0 10*3/uL (ref 0.0–0.1)
IMMATURE GRANULOCYTES: 0 %
IRON: 66 ug/dL (ref 27–159)
LDH: 166 IU/L (ref 119–226)
LDL CALC: 149 mg/dL — AB (ref 0–99)
LYMPHS ABS: 1.8 10*3/uL (ref 0.7–3.1)
LYMPHS: 29 %
MCH: 29.3 pg (ref 26.6–33.0)
MCHC: 32.8 g/dL (ref 31.5–35.7)
MCV: 90 fL (ref 79–97)
MONOS ABS: 0.5 10*3/uL (ref 0.1–0.9)
Monocytes: 9 %
NEUTROS PCT: 60 %
Neutrophils Absolute: 3.7 10*3/uL (ref 1.4–7.0)
POTASSIUM: 4.4 mmol/L (ref 3.5–5.2)
Phosphorus: 3.5 mg/dL (ref 2.5–4.5)
Platelets: 241 10*3/uL (ref 150–379)
RBC: 4.74 x10E6/uL (ref 3.77–5.28)
RDW: 13 % (ref 12.3–15.4)
SODIUM: 143 mmol/L (ref 134–144)
T3 UPTAKE RATIO: 27 % (ref 24–39)
T4 TOTAL: 8.5 ug/dL (ref 4.5–12.0)
TOTAL PROTEIN: 6.8 g/dL (ref 6.0–8.5)
TRIGLYCERIDES: 37 mg/dL (ref 0–149)
TSH: 1.71 u[IU]/mL (ref 0.450–4.500)
Uric Acid: 4.5 mg/dL (ref 2.5–7.1)
VLDL Cholesterol Cal: 7 mg/dL (ref 5–40)
WBC: 6.2 10*3/uL (ref 3.4–10.8)

## 2016-06-16 LAB — VITAMIN D 25 HYDROXY (VIT D DEFICIENCY, FRACTURES): Vit D, 25-Hydroxy: 62 ng/mL (ref 30.0–100.0)

## 2016-07-31 ENCOUNTER — Ambulatory Visit
Admission: RE | Admit: 2016-07-31 | Discharge: 2016-07-31 | Disposition: A | Discharge status: Home / Self Care | Payer: Managed Care, Other (non HMO) | Source: Ambulatory Visit | Attending: Internal Medicine | Admitting: Internal Medicine

## 2016-07-31 DIAGNOSIS — Z1231 Encounter for screening mammogram for malignant neoplasm of breast: Secondary | ICD-10-CM | POA: Diagnosis present

## 2016-07-31 DIAGNOSIS — Z1239 Encounter for other screening for malignant neoplasm of breast: Secondary | ICD-10-CM

## 2016-12-19 DIAGNOSIS — Z86018 Personal history of other benign neoplasm: Secondary | ICD-10-CM

## 2016-12-19 HISTORY — DX: Personal history of other benign neoplasm: Z86.018

## 2017-05-07 ENCOUNTER — Encounter: Payer: Self-pay | Admitting: Physician Assistant

## 2017-05-07 ENCOUNTER — Ambulatory Visit: Payer: Self-pay | Admitting: Physician Assistant

## 2017-05-07 VITALS — BP 110/60 | HR 83 | Temp 98.1°F | Resp 16

## 2017-05-07 DIAGNOSIS — N390 Urinary tract infection, site not specified: Secondary | ICD-10-CM

## 2017-05-07 LAB — POCT URINALYSIS DIPSTICK
BILIRUBIN UA: NEGATIVE
Blood, UA: NEGATIVE
Glucose, UA: NEGATIVE
KETONES UA: NEGATIVE
Nitrite, UA: NEGATIVE
Protein, UA: NEGATIVE
Urobilinogen, UA: 0.2 E.U./dL
pH, UA: 6.5 (ref 5.0–8.0)

## 2017-05-07 MED ORDER — CIPROFLOXACIN HCL 500 MG PO TABS: 500.0000 mg | ORAL_TABLET | Freq: Two times a day (BID) | ORAL | 0 refills | Status: DC

## 2017-05-07 NOTE — Progress Notes (Signed)
S:  C/o uti sx for 2 days, burning, urgency, frequency, denies fever/chills, vaginal discharge, abdominal pain or flank pain:  Remainder ros neg; did use a home uti test and leuks were "really purple" yesterday, light purple this morning  O:  Vitals wnl, nad, no cva tenderness, back nontender, lungs c t a,cv rrr, abd soft nontender, bs normal, n/v intact, ua trace leuks  A: uti  P: cipro 500 bid x 3d, increase water intake, add cranberry juice, return if not improving in 2 -3 days, return earlier if worsening, discussed pyelonephritis sx, urine culture

## 2017-05-10 LAB — URINE CULTURE

## 2017-05-15 NOTE — Telephone Encounter (Signed)
Orders

## 2017-06-07 ENCOUNTER — Ambulatory Visit (INDEPENDENT_AMBULATORY_CARE_PROVIDER_SITE_OTHER): Payer: Managed Care, Other (non HMO) | Admitting: Internal Medicine

## 2017-06-07 ENCOUNTER — Encounter: Payer: Self-pay | Admitting: Internal Medicine

## 2017-06-07 ENCOUNTER — Other Ambulatory Visit (HOSPITAL_COMMUNITY)
Admission: RE | Admit: 2017-06-07 | Discharge: 2017-06-07 | Disposition: A | Discharge status: Home / Self Care | Payer: Managed Care, Other (non HMO) | Source: Ambulatory Visit | Attending: Internal Medicine | Admitting: Internal Medicine

## 2017-06-07 VITALS — BP 100/62 | HR 90 | Temp 98.7°F | Resp 15 | Ht 60.0 in | Wt 103.0 lb

## 2017-06-07 DIAGNOSIS — G250 Essential tremor: Secondary | ICD-10-CM | POA: Diagnosis not present

## 2017-06-07 DIAGNOSIS — Z124 Encounter for screening for malignant neoplasm of cervix: Secondary | ICD-10-CM | POA: Diagnosis not present

## 2017-06-07 DIAGNOSIS — H8110 Benign paroxysmal vertigo, unspecified ear: Secondary | ICD-10-CM

## 2017-06-07 DIAGNOSIS — E673 Hypervitaminosis D: Secondary | ICD-10-CM

## 2017-06-07 DIAGNOSIS — Z0000 Encounter for general adult medical examination without abnormal findings: Secondary | ICD-10-CM | POA: Insufficient documentation

## 2017-06-07 NOTE — Patient Instructions (Signed)
Health Maintenance for Postmenopausal Women Menopause is a normal process in which your reproductive ability comes to an end. This process happens gradually over a span of months to years, usually between the ages of 22 and 9. Menopause is complete when you have missed 12 consecutive menstrual periods. It is important to talk with your health care provider about some of the most common conditions that affect postmenopausal women, such as heart disease, cancer, and bone loss (osteoporosis). Adopting a healthy lifestyle and getting preventive care can help to promote your health and wellness. Those actions can also lower your chances of developing some of these common conditions. What should I know about menopause? During menopause, you may experience a number of symptoms, such as:  Moderate-to-severe hot flashes.  Night sweats.  Decrease in sex drive.  Mood swings.  Headaches.  Tiredness.  Irritability.  Memory problems.  Insomnia.  Choosing to treat or not to treat menopausal changes is an individual decision that you make with your health care provider. What should I know about hormone replacement therapy and supplements? Hormone therapy products are effective for treating symptoms that are associated with menopause, such as hot flashes and night sweats. Hormone replacement carries certain risks, especially as you become older. If you are thinking about using estrogen or estrogen with progestin treatments, discuss the benefits and risks with your health care provider. What should I know about heart disease and stroke? Heart disease, heart attack, and stroke become more likely as you age. This may be due, in part, to the hormonal changes that your body experiences during menopause. These can affect how your body processes dietary fats, triglycerides, and cholesterol. Heart attack and stroke are both medical emergencies. There are many things that you can do to help prevent heart disease  and stroke:  Have your blood pressure checked at least every 1-2 years. High blood pressure causes heart disease and increases the risk of stroke.  If you are 61-22 years old, ask your health care provider if you should take aspirin to prevent a heart attack or a stroke.  Do not use any tobacco products, including cigarettes, chewing tobacco, or electronic cigarettes. If you need help quitting, ask your health care provider.  It is important to eat a healthy diet and maintain a healthy weight. ? Be sure to include plenty of vegetables, fruits, low-fat dairy products, and lean protein. ? Avoid eating foods that are high in solid fats, added sugars, or salt (sodium).  Get regular exercise. This is one of the most important things that you can do for your health. ? Try to exercise for at least 150 minutes each week. The type of exercise that you do should increase your heart rate and make you sweat. This is known as moderate-intensity exercise. ? Try to do strengthening exercises at least twice each week. Do these in addition to the moderate-intensity exercise.  Know your numbers.Ask your health care provider to check your cholesterol and your blood glucose. Continue to have your blood tested as directed by your health care provider.  What should I know about cancer screening? There are several types of cancer. Take the following steps to reduce your risk and to catch any cancer development as early as possible. Breast Cancer  Practice breast self-awareness. ? This means understanding how your breasts normally appear and feel. ? It also means doing regular breast self-exams. Let your health care provider know about any changes, no matter how small.  If you are 61  or older, have a clinician do a breast exam (clinical breast exam or CBE) every year. Depending on your age, family history, and medical history, it may be recommended that you also have a yearly breast X-ray (mammogram).  If you  have a family history of breast cancer, talk with your health care provider about genetic screening.  If you are at high risk for breast cancer, talk with your health care provider about having an MRI and a mammogram every year.  Breast cancer (BRCA) gene test is recommended for women who have family members with BRCA-related cancers. Results of the assessment will determine the need for genetic counseling and BRCA1 and for BRCA2 testing. BRCA-related cancers include these types: ? Breast. This occurs in males or females. ? Ovarian. ? Tubal. This may also be called fallopian tube cancer. ? Cancer of the abdominal or pelvic lining (peritoneal cancer). ? Prostate. ? Pancreatic.  Cervical, Uterine, and Ovarian Cancer Your health care provider may recommend that you be screened regularly for cancer of the pelvic organs. These include your ovaries, uterus, and vagina. This screening involves a pelvic exam, which includes checking for microscopic changes to the surface of your cervix (Pap test).  For women ages 21-65, health care providers may recommend a pelvic exam and a Pap test every three years. For women ages 79-61, they may recommend the Pap test and pelvic exam, combined with testing for human papilloma virus (HPV), every five years. Some types of HPV increase your risk of cervical cancer. Testing for HPV may also be done on women of any age who have unclear Pap test results.  Other health care providers may not recommend any screening for nonpregnant women who are considered low risk for pelvic cancer and have no symptoms. Ask your health care provider if a screening pelvic exam is right for you.  If you have had past treatment for cervical cancer or a condition that could lead to cancer, you need Pap tests and screening for cancer for at least 20 years after your treatment. If Pap tests have been discontinued for you, your risk factors (such as having a new sexual partner) need to be  reassessed to determine if you should start having screenings again. Some women have medical problems that increase the chance of getting cervical cancer. In these cases, your health care provider may recommend that you have screening and Pap tests more often.  If you have a family history of uterine cancer or ovarian cancer, talk with your health care provider about genetic screening.  If you have vaginal bleeding after reaching menopause, tell your health care provider.  There are currently no reliable tests available to screen for ovarian cancer.  Lung Cancer Lung cancer screening is recommended for adults 69-62 years old who are at high risk for lung cancer because of a history of smoking. A yearly low-dose CT scan of the lungs is recommended if you:  Currently smoke.  Have a history of at least 30 pack-years of smoking and you currently smoke or have quit within the past 15 years. A pack-year is smoking an average of one pack of cigarettes per day for one year.  Yearly screening should:  Continue until it has been 15 years since you quit.  Stop if you develop a health problem that would prevent you from having lung cancer treatment.  Colorectal Cancer  This type of cancer can be detected and can often be prevented.  Routine colorectal cancer screening usually begins at  age 42 and continues through age 45.  If you have risk factors for colon cancer, your health care provider may recommend that you be screened at an earlier age.  If you have a family history of colorectal cancer, talk with your health care provider about genetic screening.  Your health care provider may also recommend using home test kits to check for hidden blood in your stool.  A small camera at the end of a tube can be used to examine your colon directly (sigmoidoscopy or colonoscopy). This is done to check for the earliest forms of colorectal cancer.  Direct examination of the colon should be repeated every  5-10 years until age 71. However, if early forms of precancerous polyps or small growths are found or if you have a family history or genetic risk for colorectal cancer, you may need to be screened more often.  Skin Cancer  Check your skin from head to toe regularly.  Monitor any moles. Be sure to tell your health care provider: ? About any new moles or changes in moles, especially if there is a change in a mole's shape or color. ? If you have a mole that is larger than the size of a pencil eraser.  If any of your family members has a history of skin cancer, especially at a young age, talk with your health care provider about genetic screening.  Always use sunscreen. Apply sunscreen liberally and repeatedly throughout the day.  Whenever you are outside, protect yourself by wearing long sleeves, pants, a wide-brimmed hat, and sunglasses.  What should I know about osteoporosis? Osteoporosis is a condition in which bone destruction happens more quickly than new bone creation. After menopause, you may be at an increased risk for osteoporosis. To help prevent osteoporosis or the bone fractures that can happen because of osteoporosis, the following is recommended:  If you are 46-71 years old, get at least 1,000 mg of calcium and at least 600 mg of vitamin D per day.  If you are older than age 55 but younger than age 65, get at least 1,200 mg of calcium and at least 600 mg of vitamin D per day.  If you are older than age 54, get at least 1,200 mg of calcium and at least 800 mg of vitamin D per day.  Smoking and excessive alcohol intake increase the risk of osteoporosis. Eat foods that are rich in calcium and vitamin D, and do weight-bearing exercises several times each week as directed by your health care provider. What should I know about how menopause affects my mental health? Depression may occur at any age, but it is more common as you become older. Common symptoms of depression  include:  Low or sad mood.  Changes in sleep patterns.  Changes in appetite or eating patterns.  Feeling an overall lack of motivation or enjoyment of activities that you previously enjoyed.  Frequent crying spells.  Talk with your health care provider if you think that you are experiencing depression. What should I know about immunizations? It is important that you get and maintain your immunizations. These include:  Tetanus, diphtheria, and pertussis (Tdap) booster vaccine.  Influenza every year before the flu season begins.  Pneumonia vaccine.  Shingles vaccine.  Your health care provider may also recommend other immunizations. This information is not intended to replace advice given to you by your health care provider. Make sure you discuss any questions you have with your health care provider. Document Released: 09/28/2005  Document Revised: 02/24/2016 Document Reviewed: 05/10/2015 [employer] Interactive Patient Education  2018 [employer]  

## 2017-06-07 NOTE — Progress Notes (Signed)
Patient ID: Debbie Sanchez, female    DOB: 1956/02/11  Age: 61 y.o. MRN: [id number]  The patient is here for annual preventive  examination and management of other chronic and acute problems.   The risk factors are reflected in the social history.  The roster of all physicians providing medical care to patient - is listed in the Snapshot section of the chart.  Activities of daily living:  The patient is 100% independent in all ADLs: dressing, toileting, feeding as well as independent mobility  Home safety : The patient has smoke detectors in the home. They wear seatbelts.  There are no firearms at home. There is no violence in the home.   There is no risks for hepatitis, STDs or HIV. There is no   history of blood transfusion. They have no travel history to infectious disease endemic areas of the world.  The patient has seen their dentist in the last six month. They have seen their eye doctor in the last year.    They do not  have excessive sun exposure. Discussed the need for sun protection: hats, long sleeves and use of sunscreen if there is significant sun exposure.   Diet: the importance of a healthy diet is discussed. They do have a healthy diet.  The benefits of regular aerobic exercise were discussed. She walks 4 times per week ,  20 minutes.   Depression screen: there are no signs or vegative symptoms of depression- irritability, change in appetite, anhedonia, sadness/tearfullness.  The following portions of the patient's history were reviewed and updated as appropriate: allergies, current medications, past family history, past medical history,  past surgical history, past social history  and problem list.  Visual acuity was not assessed per patient preference since she has regular follow up with her ophthalmologist. Hearing and body mass index were assessed and reviewed.   During the course of the visit the patient was educated and counseled about appropriate screening and  preventive services including : fall prevention , diabetes screening, nutrition counseling, colorectal cancer screening, and recommended immunizations.    CC: The primary encounter diagnosis was Cervical cancer screening. Diagnoses of Encounter for preventive health examination, Hypervitaminosis D, Benign essential tremor, and Benign paroxysmal positional vertigo, unspecified laterality were also pertinent to this visit.  1) persistent bilateral hand  tremor,  Worse in the morning,  Not impeding her work,  Not present at rest.  No other symptoms,  No hand weakness,  Balance issues.    [name] has a past medical history of Fibrocystic breast disease and Menopause.   She has a past surgical history that includes Tubal ligation; Cesarean section (1998); Mouth surgery; Breast biopsy (Right, 2010); and Breast cyst aspiration (Right, 2002).   Her family history includes Breast cancer in her maternal aunt; Cancer in her maternal aunt and mother.She reports that she has never smoked. She has never used smokeless tobacco. She reports that she drinks alcohol. She reports that she does not use drugs.  Outpatient Medications Prior to Visit  Medication Sig Dispense Refill  . Ascorbic Acid (VITAMIN C) 100 MG tablet Take 1,000 mg by mouth 2 (two) times daily.     . Calcium Carbonate (CALCIUM 500 PO) Take 1 tablet by mouth daily.    Marland Kitchen MELATONIN ER PO Take by mouth.    . Multiple Vitamin (MULTIVITAMIN) tablet Take 1 tablet by mouth daily.    . Diphenhydramine-APAP, sleep, (TYLENOL PM EXTRA STRENGTH PO) Take by mouth. OTC take as directed.    Marland Kitchen  ciprofloxacin (CIPRO) 500 MG tablet Take 1 tablet (500 mg total) by mouth 2 (two) times daily. (Patient not taking: Reported on 06/07/2017) 6 tablet 0  . IBUPROFEN PO Take by mouth as directed.    . promethazine (PHENERGAN) 12.5 MG tablet Take 1 tablet (12.5 mg total) by mouth every 8 (eight) hours as needed for nausea or vomiting. (Patient not taking: Reported on  05/07/2017) 20 tablet 0   No facility-administered medications prior to visit.     Review of Systems   Patient denies headache, fevers, malaise, unintentional weight loss, skin rash, eye pain, sinus congestion and sinus pain, sore throat, dysphagia,  hemoptysis , cough, dyspnea, wheezing, chest pain, palpitations, orthopnea, edema, abdominal pain, nausea, melena, diarrhea, constipation, flank pain, dysuria, hematuria, urinary  Frequency, nocturia, numbness, tingling, seizures,  Focal weakness, Loss of consciousness,  Tremor, insomnia, depression, anxiety, and suicidal ideation.      Objective:  BP 100/62 (BP Location: Left Arm, Patient Position: Sitting, Cuff Size: Normal)   Pulse 90   Temp 98.7 F (37.1 C) (Oral)   Resp 15   Ht 5' (1.524 m)   Wt 103 lb (46.7 kg)   SpO2 98%   BMI 20.12 kg/m   Physical Exam  General Appearance:    Alert, cooperative, no distress, appears stated age  Head:    Normocephalic, without obvious abnormality, atraumatic  Eyes:    PERRL, conjunctiva/corneas clear, EOM's intact, fundi    benign, both eyes  Ears:    Normal TM's and external ear canals, both ears  Nose:   Nares normal, septum midline, mucosa normal, no drainage    or sinus tenderness  Throat:   Lips, mucosa, and tongue normal; teeth and gums normal  Neck:   Supple, symmetrical, trachea midline, no adenopathy;    thyroid:  no enlargement/tenderness/nodules; no carotid   bruit or JVD  Back:     Symmetric, no curvature, ROM normal, no CVA tenderness  Lungs:     Clear to auscultation bilaterally, respirations unlabored  Chest Wall:    No tenderness or deformity   Heart:    Regular rate and rhythm, S1 and S2 normal, no murmur, rub   or gallop  Breast Exam:    No tenderness, masses, or nipple abnormality  Abdomen:     Soft, non-tender, bowel sounds active all four quadrants,    no masses, no organomegaly  Genitalia:    Pelvic: cervix normal in appearance, external genitalia normal, no adnexal  masses or tenderness, no cervical motion tenderness, rectovaginal septum normal, uterus normal size, shape, and consistency and vagina normal without discharge  Extremities:   Extremities normal, atraumatic, no cyanosis or edema  Pulses:   2+ and symmetric all extremities  Skin:   Skin color, texture, turgor normal, no rashes or lesions  Lymph nodes:   Cervical, supraclavicular, and axillary nodes normal  Neurologic:   CNII-XII intact, normal strength, sensation and reflexes    throughout     Assessment & Plan:   Problem List Items Addressed This Visit    Benign essential tremor    suggested by history,  No tremor on exam today.  Neuro exam normal. May be  Brought on by muscle fatigue      Benign paroxysmal positional vertigo    No episodes in over a year.      Cervical cancer screening - Primary   Relevant Orders   Cytology - PAP   Encounter for preventive health examination    Annual comprehensive  preventive exam was done as well as an evaluation and management of chronic conditions .  During the course of the visit the patient was educated and counseled about appropriate screening and preventive services including :  diabetes screening, last year's  lipid analysis with projected  10 year  risk for CAD , nutrition counseling, breast, cervical and colorectal cancer screening, and recommended immunizations.  Printed recommendations for health maintenance screenings was given  .lastlipid      Hypervitaminosis D    She is currently taking only amounts contained in her multii vitamin.  Repeat level is normal.         I have discontinued Ms. Greif's IBUPROFEN PO, promethazine, and ciprofloxacin. I am also having her maintain her (Diphenhydramine-APAP, sleep, (TYLENOL PM EXTRA STRENGTH PO)), multivitamin, Calcium Carbonate (CALCIUM 500 PO), vitamin C, MELATONIN ER PO, and aspirin.  Meds ordered this encounter  Medications  . aspirin 81 MG EC tablet    Sig: Take 81 mg by mouth  daily. Swallow whole.    Medications Discontinued During This Encounter  Medication Reason  . ciprofloxacin (CIPRO) 500 MG tablet Patient has not taken in last 30 days  . IBUPROFEN PO Patient has not taken in last 30 days  . promethazine (PHENERGAN) 12.5 MG tablet Patient has not taken in last 30 days    Follow-up: No Follow-up on file.   Crecencio Mc, MD

## 2017-06-09 ENCOUNTER — Other Ambulatory Visit: Payer: Self-pay | Admitting: Internal Medicine

## 2017-06-09 DIAGNOSIS — Z1211 Encounter for screening for malignant neoplasm of colon: Secondary | ICD-10-CM

## 2017-06-09 DIAGNOSIS — Z124 Encounter for screening for malignant neoplasm of cervix: Secondary | ICD-10-CM | POA: Insufficient documentation

## 2017-06-09 NOTE — Assessment & Plan Note (Signed)
She is currently taking only amounts contained in her multii vitamin.  Repeat level is normal.

## 2017-06-09 NOTE — Assessment & Plan Note (Signed)
Annual comprehensive preventive exam was done as well as an evaluation and management of chronic conditions .  During the course of the visit the patient was educated and counseled about appropriate screening and preventive services including :  diabetes screening, last year's  lipid analysis with projected  10 year  risk for CAD , nutrition counseling, breast, cervical and colorectal cancer screening, and recommended immunizations.  Printed recommendations for health maintenance screenings was given  .lastlipid

## 2017-06-09 NOTE — Assessment & Plan Note (Signed)
suggested by history,  No tremor on exam today.  Neuro exam normal. May be  Brought on by muscle fatigue

## 2017-06-09 NOTE — Assessment & Plan Note (Signed)
No episodes in over a year.

## 2017-06-12 ENCOUNTER — Other Ambulatory Visit: Payer: Self-pay

## 2017-06-12 ENCOUNTER — Encounter: Payer: Self-pay | Admitting: Internal Medicine

## 2017-06-12 DIAGNOSIS — Z299 Encounter for prophylactic measures, unspecified: Secondary | ICD-10-CM

## 2017-06-12 LAB — CYTOLOGY - PAP
Diagnosis: NEGATIVE
HPV: NOT DETECTED

## 2017-06-12 NOTE — Progress Notes (Signed)
Patient came in to have blood drawn for testing per Dr. Lupita Dawn orders.

## 2017-06-13 ENCOUNTER — Encounter: Payer: Self-pay | Admitting: Internal Medicine

## 2017-06-13 LAB — COMPREHENSIVE METABOLIC PANEL
A/G RATIO: 1.5 (ref 1.2–2.2)
ALK PHOS: 98 IU/L (ref 39–117)
ALT: 20 IU/L (ref 0–32)
AST: 22 IU/L (ref 0–40)
Albumin: 4.3 g/dL (ref 3.6–4.8)
BILIRUBIN TOTAL: 0.3 mg/dL (ref 0.0–1.2)
BUN/Creatinine Ratio: 23 (ref 12–28)
BUN: 17 mg/dL (ref 8–27)
CALCIUM: 9.5 mg/dL (ref 8.7–10.3)
CO2: 23 mmol/L (ref 20–29)
Chloride: 104 mmol/L (ref 96–106)
Creatinine, Ser: 0.73 mg/dL (ref 0.57–1.00)
GFR calc Af Amer: 103 mL/min/{1.73_m2} (ref >59)
GFR calc non Af Amer: 89 mL/min/{1.73_m2} (ref >59)
GLOBULIN, TOTAL: 2.8 g/dL (ref 1.5–4.5)
Glucose: 91 mg/dL (ref 65–99)
Potassium: 4.2 mmol/L (ref 3.5–5.2)
SODIUM: 146 mmol/L — AB (ref 134–144)
Total Protein: 7.1 g/dL (ref 6.0–8.5)

## 2017-06-13 LAB — LIPID PANEL
Chol/HDL Ratio: 2.6 ratio (ref 0.0–4.4)
Cholesterol, Total: 198 mg/dL (ref 100–199)
HDL: 75 mg/dL (ref >39)
LDL Calculated: 115 mg/dL — ABNORMAL HIGH (ref 0–99)
Triglycerides: 38 mg/dL (ref 0–149)
VLDL CHOLESTEROL CAL: 8 mg/dL (ref 5–40)

## 2017-06-13 LAB — VITAMIN D 25 HYDROXY (VIT D DEFICIENCY, FRACTURES): VIT D 25 HYDROXY: 67.8 ng/mL (ref 30.0–100.0)

## 2017-06-16 ENCOUNTER — Telehealth: Payer: Self-pay | Admitting: Internal Medicine

## 2017-06-21 ENCOUNTER — Other Ambulatory Visit: Payer: Self-pay | Admitting: Internal Medicine

## 2017-06-21 DIAGNOSIS — Z1231 Encounter for screening mammogram for malignant neoplasm of breast: Secondary | ICD-10-CM

## 2017-08-02 ENCOUNTER — Ambulatory Visit
Admission: RE | Admit: 2017-08-02 | Discharge: 2017-08-02 | Disposition: A | Discharge status: Home / Self Care | Payer: Managed Care, Other (non HMO) | Source: Ambulatory Visit | Attending: Internal Medicine | Admitting: Internal Medicine

## 2017-08-02 DIAGNOSIS — Z1231 Encounter for screening mammogram for malignant neoplasm of breast: Secondary | ICD-10-CM | POA: Insufficient documentation

## 2017-08-14 NOTE — Telephone Encounter (Signed)
Signed.

## 2017-09-09 ENCOUNTER — Ambulatory Visit: Payer: Self-pay | Admitting: Emergency Medicine

## 2017-09-09 VITALS — BP 120/70 | HR 86 | Temp 98.3°F | Resp 16

## 2017-09-09 DIAGNOSIS — R319 Hematuria, unspecified: Secondary | ICD-10-CM

## 2017-09-09 DIAGNOSIS — R300 Dysuria: Secondary | ICD-10-CM

## 2017-09-09 MED ORDER — CIPROFLOXACIN HCL 250 MG PO TABS: 250.0000 mg | ORAL_TABLET | Freq: Two times a day (BID) | ORAL | 0 refills | Status: DC

## 2017-09-09 NOTE — Progress Notes (Signed)
Subjective. Patient awoke this morning with urgency and burning on urination. She had urinary frequency all through the morning. She noticed a significant amount of blood in her urine. Of note her father did have bladder cancer. Review of systems. Patient status post menopause. Objective. No CVA tenderness. Abdomen soft without tenderness. UA specific gravity 1.020  3+ occult blood, trace protein, negative nitrite, 1+ leukocytes. Assessment. Onset this morning of dysuria with significant hematuria and history of 1-2 urinary tract infections per year. Plan. Check urine culture. Cipro 250 twice a day for 3 days. Follow-up with primary care physician due to hematuria and family history of bladder cancer.

## 2017-09-09 NOTE — Addendum Note (Signed)
Addended by: Rudene Anda T on: 09/09/2017 02:39 PM   Modules accepted: Orders

## 2017-09-11 LAB — URINE CULTURE

## 2017-09-19 ENCOUNTER — Encounter: Payer: Self-pay | Admitting: Internal Medicine

## 2017-09-19 ENCOUNTER — Ambulatory Visit: Payer: Managed Care, Other (non HMO) | Admitting: Internal Medicine

## 2017-09-19 VITALS — BP 110/62 | HR 78 | Temp 98.0°F | Resp 16 | Ht 60.0 in | Wt 106.2 lb

## 2017-09-19 DIAGNOSIS — R300 Dysuria: Secondary | ICD-10-CM | POA: Diagnosis not present

## 2017-09-19 DIAGNOSIS — R319 Hematuria, unspecified: Secondary | ICD-10-CM | POA: Diagnosis not present

## 2017-09-19 DIAGNOSIS — M62838 Other muscle spasm: Secondary | ICD-10-CM

## 2017-09-19 DIAGNOSIS — R310 Gross hematuria: Secondary | ICD-10-CM

## 2017-09-19 LAB — POCT URINALYSIS DIPSTICK
Bilirubin, UA: NEGATIVE
Blood, UA: NEGATIVE
Glucose, UA: NEGATIVE
Ketones, UA: NEGATIVE
LEUKOCYTES UA: NEGATIVE
NITRITE UA: NEGATIVE
PH UA: 7 (ref 5.0–8.0)
PROTEIN UA: NEGATIVE
Spec Grav, UA: 1.015 (ref 1.010–1.025)
UROBILINOGEN UA: 0.2 U/dL

## 2017-09-19 MED ORDER — MELOXICAM 15 MG PO TABS: 15.0000 mg | ORAL_TABLET | Freq: Every day | ORAL | 0 refills | Status: DC

## 2017-09-19 MED ORDER — CYCLOBENZAPRINE HCL 5 MG PO TABS: 5.0000 mg | ORAL_TABLET | Freq: Three times a day (TID) | ORAL | 1 refills | Status: DC | PRN

## 2017-09-19 NOTE — Progress Notes (Signed)
Subjective:  Patient ID: Debbie Sanchez, female    DOB: Oct 05, 1955  Age: 62 y.o. MRN: [id number]  CC: The primary encounter diagnosis was Dysuria. Diagnoses of Gross hematuria, Hematuria, undiagnosed cause, and Trapezius muscle spasm were also pertinent to this visit.  HPI Debbie Sanchez presents for follow up  Frequent episodes of dysuria and hematuria. Had an episode last week;  symptoms were frequency and stabbing pain in suprapubic area along with  grossly bloody urine with clots.   Went to employee health and was treated empirically with cipro.  ,  But culture was negative and symptoms had resolved after 6 hours even before starting the abx.   Takes aspirin on a as needed basis for headaches and muscle aches, less than one or twice monthly  repeat ua today is normal.  Family history of bladder cancer .  Recurrent right shoulder trapezius  muscle pain.  Related ot occupational overuse.  Flares up every couple of days .     Outpatient Medications Prior to Visit  Medication Sig Dispense Refill  . Ascorbic Acid (VITAMIN C) 100 MG tablet Take 1,000 mg by mouth 2 (two) times daily.     Marland Kitchen aspirin 81 MG EC tablet Take 81 mg by mouth daily. Swallow whole.    . Calcium Carbonate (CALCIUM 500 PO) Take 1 tablet by mouth daily.    Marland Kitchen MELATONIN ER PO Take by mouth.    . Multiple Vitamin (MULTIVITAMIN) tablet Take 1 tablet by mouth daily.    . ciprofloxacin (CIPRO) 250 MG tablet Take 1 tablet (250 mg total) by mouth 2 (two) times daily. (Patient not taking: Reported on 09/19/2017) 6 tablet 0  . Diphenhydramine-APAP, sleep, (TYLENOL PM EXTRA STRENGTH PO) Take by mouth. OTC take as directed.     No facility-administered medications prior to visit.     Review of Systems;  Patient denies headache, fevers, malaise, unintentional weight loss, skin rash, eye pain, sinus congestion and sinus pain, sore throat, dysphagia,  hemoptysis , cough, dyspnea, wheezing, chest pain, palpitations, orthopnea, edema,  abdominal pain, nausea, melena, diarrhea, constipation, flank pain, dysuria, hematuria, urinary  Frequency, nocturia, numbness, tingling, seizures,  Focal weakness, Loss of consciousness,  Tremor, insomnia, depression, anxiety, and suicidal ideation.      Objective:  BP 110/62 (BP Location: Left Arm, Patient Position: Sitting, Cuff Size: Normal)   Pulse 78   Temp 98 F (36.7 C) (Oral)   Resp 16   Ht 5' (1.524 m)   Wt 106 lb 3.2 oz (48.2 kg)   SpO2 100%   BMI 20.74 kg/m   BP Readings from Last 3 Encounters:  09/19/17 110/62  09/09/17 120/70  06/07/17 100/62    Wt Readings from Last 3 Encounters:  09/19/17 106 lb 3.2 oz (48.2 kg)  06/07/17 103 lb (46.7 kg)  06/05/16 99 lb 12 oz (45.2 kg)    General appearance: alert, cooperative and appears stated age Ears: normal TM's and external ear canals both ears Throat: lips, mucosa, and tongue normal; teeth and gums normal Neck: no adenopathy, no carotid bruit, supple, symmetrical, trachea midline and thyroid not enlarged, symmetric, no tenderness/mass/nodules Back: symmetric, no curvature. ROM normal. No CVA tenderness. Lungs: clear to auscultation bilaterally Heart: regular rate and rhythm, S1, S2 normal, no murmur, click, rub or gallop Abdomen: soft, non-tender; bowel sounds normal; no masses,  no organomegaly Pulses: 2+ and symmetric Skin: Skin color, texture, turgor normal. No rashes or lesions Lymph nodes: Cervical, supraclavicular, and axillary nodes normal.  No results found for: HGBA1C  Lab Results  Component Value Date   CREATININE 0.73 06/12/2017   CREATININE 0.76 06/15/2016   CREATININE 0.67 06/06/2015    Lab Results  Component Value Date   WBC 6.2 06/15/2016   HGB 13.9 06/15/2016   HCT 42.4 06/15/2016   PLT 241 06/15/2016   GLUCOSE 91 06/12/2017   CHOL 198 06/12/2017   TRIG 38 06/12/2017   HDL 75 06/12/2017   LDLCALC 115 (H) 06/12/2017   ALT 20 06/12/2017   AST 22 06/12/2017   NA 146 (H) 06/12/2017   K  4.2 06/12/2017   CL 104 06/12/2017   CREATININE 0.73 06/12/2017   BUN 17 06/12/2017   CO2 23 06/12/2017   TSH 1.710 06/15/2016    Mm Screening Breast Tomo Bilateral  Result Date: 08/02/2017 CLINICAL DATA:  Screening. EXAM: 2D DIGITAL SCREENING BILATERAL MAMMOGRAM WITH CAD AND ADJUNCT TOMO COMPARISON:  Previous exam(s). ACR Breast Density Category b: There are scattered areas of fibroglandular density. FINDINGS: There are no findings suspicious for malignancy. Images were processed with CAD. IMPRESSION: No mammographic evidence of malignancy. A result letter of this screening mammogram will be mailed directly to the patient. RECOMMENDATION: Screening mammogram in one year. (Code:SM-B-01Y) BI-RADS CATEGORY  1: Negative. Electronically Signed   By: Marin Olp M.D.   On: 08/02/2017 09:42    Assessment & Plan:   Problem List Items Addressed This Visit    Hematuria, undiagnosed cause    Occurred in the absence of documented infection..  Family history of bladder cancer,  Patient is post menopausal and has a uterus.  UA normal today.  Pelvic ultrasound,  Urology referral in process       Trapezius muscle spasm    Recurrent,  Related to strain and activities.  meloxicam , flexeril and tylenol        Other Visit Diagnoses    Dysuria    -  Primary   Relevant Orders   POCT Urinalysis Dipstick (Completed)   Gross hematuria       Relevant Orders   Ambulatory referral to Urology   US PELVIC COMPLETE WITH TRANSVAGINAL    A total of 25 minutes of face to face time was spent with patient more than half of which was spent in counselling about the above mentioned conditions  and coordination of care   I have discontinued Jana Half E. Reposa's (Diphenhydramine-APAP, sleep, (TYLENOL PM EXTRA STRENGTH PO)) and ciprofloxacin. I am also having her start on meloxicam and cyclobenzaprine. Additionally, I am having her maintain her multivitamin, Calcium Carbonate (CALCIUM 500 PO), vitamin C, MELATONIN ER PO,  and aspirin.  Meds ordered this encounter  Medications  . meloxicam (MOBIC) 15 MG tablet    Sig: Take 1 tablet (15 mg total) by mouth daily.    Dispense:  30 tablet    Refill:  0  . cyclobenzaprine (FLEXERIL) 5 MG tablet    Sig: Take 1 tablet (5 mg total) by mouth 3 (three) times daily as needed for muscle spasms.    Dispense:  30 tablet    Refill:  1    Medications Discontinued During This Encounter  Medication Reason  . ciprofloxacin (CIPRO) 250 MG tablet Completed Course  . Diphenhydramine-APAP, sleep, (TYLENOL PM EXTRA STRENGTH PO) Patient has not taken in last 30 days    Follow-up: Return in about 6 months (around 03/19/2018) for cpe.   Crecencio Mc, MD

## 2017-09-19 NOTE — Patient Instructions (Addendum)
Urology referral and pelvic ultrasound  For evaluation of your  bladder issues (and to make sure the blood wasn;t from your uterus)   abstain from aspirin until evalution is complete  For your trapezius muscle strain:   Use meloxicam  15 mg daily for joint and muscle pain .  You can add tylenol or the muscle relaxer as needed  flexeril for muscle spasm    recommend getting the majority of your calcium and Vitamin D  through diet rather than supplements given the recent association of calcium supplements with increased coronary artery calcium scores  Try the almond , soyand cashew milks that most grocery stores  now carry  in the dairy  Section>   They are lactose free:  [employer] brand Almond Light,  Original formula.  Delicious,  Low carb,  Low cal,  Cholesterol free   DEXA scan if insurance will pay for it,

## 2017-09-22 DIAGNOSIS — R319 Hematuria, unspecified: Secondary | ICD-10-CM | POA: Insufficient documentation

## 2017-09-22 DIAGNOSIS — M62838 Other muscle spasm: Secondary | ICD-10-CM | POA: Insufficient documentation

## 2017-09-22 NOTE — Assessment & Plan Note (Signed)
Occurred in the absence of documented infection..  Family history of bladder cancer,  Patient is post menopausal and has a uterus.  UA normal today.  Pelvic ultrasound,  Urology referral in process

## 2017-09-22 NOTE — Assessment & Plan Note (Signed)
Recurrent,  Related to strain and activities.  meloxicam , flexeril and tylenol

## 2017-09-23 ENCOUNTER — Encounter: Payer: Self-pay | Admitting: Urology

## 2017-09-23 ENCOUNTER — Ambulatory Visit: Payer: Managed Care, Other (non HMO) | Admitting: Urology

## 2017-09-23 VITALS — BP 107/67 | HR 96 | Ht 60.0 in | Wt 105.0 lb

## 2017-09-23 DIAGNOSIS — R310 Gross hematuria: Secondary | ICD-10-CM | POA: Diagnosis not present

## 2017-09-23 LAB — URINALYSIS, COMPLETE
Bilirubin, UA: NEGATIVE
Glucose, UA: NEGATIVE
Ketones, UA: NEGATIVE
Leukocytes, UA: NEGATIVE
NITRITE UA: NEGATIVE
PH UA: 5.5 (ref 5.0–7.5)
PROTEIN UA: NEGATIVE
RBC, UA: NEGATIVE
Specific Gravity, UA: 1.02 (ref 1.005–1.030)
UUROB: 0.2 mg/dL (ref 0.2–1.0)

## 2017-09-23 LAB — MICROSCOPIC EXAMINATION: RBC MICROSCOPIC, UA: NONE SEEN /HPF (ref 0–?)

## 2017-09-23 NOTE — Progress Notes (Signed)
09/23/2017 1:41 PM   Debbie Sanchez 1956/02/05 [id number]  Referring provider: Crecencio Mc, MD [place], [place] [address]  Chief Complaint  Patient presents with  . Hematuria    New patient    HPI: 62 yo F referred for further evaluation of gross hematuria.    About 2 weeks ago, she woke up in the middle night with urinary frequency, dysuria, and gross hematuria.  Her hematuria was severe with clots.  She was seen later that day at employee health where a urine dip was performed showing 3+ blood, negative nitrite, 1+ leukocyte esterase, trace protein.  Urine culture was sent which ultimately grew 10-25,000 colonies of mixed urogenital flora.  She was prescribed Cipro 250 mg twice daily for 3 days.  After taking just 1 dose of antibiotics onset of symptoms, her symptoms completely resolved.  She reports that this is very atypical for her UTI type symptoms.  Generally, her symptoms persist for at least 24 hours after starting antibiotics.  She also reports that her hematuria was far more severe than typical.  She was uncertain on this occasion if this was even a urinary tract infection at all given the very quick resolution of her symptoms.  She denies any flank pain or history of kidney stones.  She has no personal history of GU malignancies, although her father has a history of bladder cancer associated with presumably Actos and smoking.  Her mother had a history of a bladder cyst which was surgically removed.  She also has a daughter with a duplicated system, reflux and renal atrophy.  She does have occasional rare UTIs, 1-2 per year.    Never smoker.    UA today is completely negative.  PMH: Past Medical History:  Diagnosis Date  . Fibrocystic breast disease    rt breast aspiration 2002  . Menopause    last menses 2008    Surgical History: Past Surgical History:  Procedure Laterality Date  . BREAST BIOPSY Right 2010   cyst aspiration/US  guided biopsy, clip placement  . BREAST CYST ASPIRATION Right 2002   cyst aspiration  . CESAREAN SECTION  1998  . MOUTH SURGERY    . TUBAL LIGATION      Home Medications:  Allergies as of 09/23/2017   No Known Allergies     Medication List        Accurate as of 09/23/17  1:41 PM. Always use your most recent med list.          aspirin 81 MG EC tablet Take 81 mg by mouth daily. Swallow whole.   CALCIUM 500 PO Take 1 tablet by mouth daily.   cyclobenzaprine 5 MG tablet Commonly known as:  FLEXERIL Take 1 tablet (5 mg total) by mouth 3 (three) times daily as needed for muscle spasms.   MELATONIN ER PO Take by mouth.   meloxicam 15 MG tablet Commonly known as:  MOBIC Take 1 tablet (15 mg total) by mouth daily.   multivitamin tablet Take 1 tablet by mouth daily.   vitamin C 100 MG tablet Take 1,000 mg by mouth 2 (two) times daily.       Allergies: No Known Allergies  Family History: Family History  Problem Relation Age of Onset  . Cancer Mother        colon CA / resected completely 1999  . Cancer Maternal Aunt        breast CA  . Breast cancer Maternal Aunt  2 mat great aunts    Social History:  reports that  has never smoked. she has never used smokeless tobacco. She reports that she drinks alcohol. She reports that she does not use drugs.  ROS: UROLOGY Frequent Urination?: Yes Hard to postpone urination?: No Burning/pain with urination?: No Get up at night to urinate?: Yes Leakage of urine?: Yes Urine stream starts and stops?: No Trouble starting stream?: No Do you have to strain to urinate?: No Blood in urine?: Yes Urinary tract infection?: Yes Sexually transmitted disease?: No Injury to kidneys or bladder?: No Painful intercourse?: No Weak stream?: No Currently pregnant?: No Vaginal bleeding?: No Last menstrual period?: n  Gastrointestinal Nausea?: No Vomiting?: No Indigestion/heartburn?: No Diarrhea?: No Constipation?:  No  Constitutional Fever: No Night sweats?: No Weight loss?: No Fatigue?: No  Skin Skin rash/lesions?: No Itching?: No  Eyes Blurred vision?: No Double vision?: No  Ears/Nose/Throat Sore throat?: No Sinus problems?: No  Hematologic/Lymphatic Swollen glands?: No Easy bruising?: No  Cardiovascular Leg swelling?: No Chest pain?: No  Respiratory Cough?: No Shortness of breath?: No  Endocrine Excessive thirst?: No  Musculoskeletal Back pain?: No Joint pain?: No  Neurological Headaches?: No Dizziness?: No  Psychologic Depression?: No Anxiety?: No  Physical Exam: BP 107/67   Pulse 96   Ht 5' (1.524 m)   Wt 105 lb (47.6 kg)   BMI 20.51 kg/m   Constitutional:  Alert and oriented, No acute distress. HEENT: [place] AT, moist mucus membranes.  Trachea midline, no masses. Cardiovascular: No clubbing, cyanosis, or edema. Respiratory: Normal respiratory effort, no increased work of breathing. GI: Abdomen is soft, nontender, nondistended, no abdominal masses GU: No CVA tenderness.  Skin: No rashes, bruises or suspicious lesions. Neurologic: Grossly intact, no focal deficits, moving all 4 extremities. Psychiatric: Normal mood and affect.  Laboratory Data: Lab Results  Component Value Date   WBC 6.2 06/15/2016   HGB 13.9 06/15/2016   HCT 42.4 06/15/2016   MCV 90 06/15/2016   PLT 241 06/15/2016    Lab Results  Component Value Date   CREATININE 0.73 06/12/2017    Urinalysis Lab Results  Component Value Date   SPECGRAV 1.015 09/19/2017   PHUR 7.0 09/19/2017   COLORU yellow 09/19/2017   LEUKOCYTESUR Negative 09/19/2017   PROTEINUR negative 09/19/2017   KETONESU negative 09/19/2017   BILIRUBINUR negative 09/19/2017   NITRITE negative 09/19/2017    UA was repeated today, negative for any blood on microscopic evaluation.  Dip is also completely negative.  Pertinent Imaging: No renal cross-sectional imaging  Assessment & Plan:    1. Gross  hematuria Atypical presentation with gross hematuria, dysuria for very short duration. Urine culture negative which is concerning for possible additional underlying process. I have recommended further evaluation with standard gross hematuria evaluation including CT urogram, cystoscopy, and possible bladder cytology.  She is agreeable to this plan. - Urinalysis, Complete - CT HEMATURIA WORKUP; Future  No Follow-up on file.  Hollice Espy, MD  [facility] [address], [place], [place] [address] [phone]

## 2017-09-25 ENCOUNTER — Ambulatory Visit: Payer: Self-pay

## 2017-09-26 ENCOUNTER — Ambulatory Visit: Payer: Managed Care, Other (non HMO)

## 2017-10-25 ENCOUNTER — Ambulatory Visit
Admission: RE | Admit: 2017-10-25 | Discharge: 2017-10-25 | Disposition: A | Discharge status: Home / Self Care | Payer: Managed Care, Other (non HMO) | Source: Ambulatory Visit | Attending: Urology | Admitting: Urology

## 2017-10-25 DIAGNOSIS — R310 Gross hematuria: Secondary | ICD-10-CM | POA: Insufficient documentation

## 2017-10-25 DIAGNOSIS — N200 Calculus of kidney: Secondary | ICD-10-CM | POA: Insufficient documentation

## 2017-10-25 DIAGNOSIS — D1803 Hemangioma of intra-abdominal structures: Secondary | ICD-10-CM | POA: Diagnosis not present

## 2017-10-25 LAB — POCT I-STAT CREATININE: Creatinine, Ser: 0.6 mg/dL (ref 0.44–1.00)

## 2017-10-25 MED ORDER — IOPAMIDOL (ISOVUE-300) INJECTION 61%
100.0000 mL | Freq: Once | INTRAVENOUS | Status: AC | PRN
  Administered 2017-10-25: 100 mL via INTRAVENOUS

## 2017-10-29 ENCOUNTER — Encounter: Payer: Self-pay | Admitting: Urology

## 2017-10-29 ENCOUNTER — Ambulatory Visit: Payer: Managed Care, Other (non HMO) | Admitting: Urology

## 2017-10-29 VITALS — BP 96/60 | HR 84 | Ht 60.0 in | Wt 103.0 lb

## 2017-10-29 DIAGNOSIS — R310 Gross hematuria: Secondary | ICD-10-CM

## 2017-10-29 DIAGNOSIS — N200 Calculus of kidney: Secondary | ICD-10-CM

## 2017-10-29 DIAGNOSIS — R350 Frequency of micturition: Secondary | ICD-10-CM

## 2017-10-29 LAB — URINALYSIS, COMPLETE
Bilirubin, UA: NEGATIVE
Glucose, UA: NEGATIVE
Ketones, UA: NEGATIVE
Leukocytes, UA: NEGATIVE
Nitrite, UA: NEGATIVE
Protein, UA: NEGATIVE
RBC, UA: NEGATIVE
Specific Gravity, UA: 1.015 (ref 1.005–1.030)
Urobilinogen, Ur: 0.2 mg/dL (ref 0.2–1.0)
pH, UA: 6 (ref 5.0–7.5)

## 2017-10-29 MED ORDER — LIDOCAINE HCL 2 % EX GEL
1.0000 | Freq: Once | CUTANEOUS | Status: AC
Start: 2017-10-29 — End: 2017-10-29
  Administered 2017-10-29: 1 via URETHRAL

## 2017-10-29 MED ORDER — CIPROFLOXACIN HCL 500 MG PO TABS
500.0000 mg | ORAL_TABLET | Freq: Once | ORAL | Status: AC
  Administered 2017-10-29: 500 mg via ORAL

## 2017-10-29 NOTE — Progress Notes (Signed)
   10/29/17  CC:  Chief Complaint  Patient presents with  . Cysto    HPI: 62 year old female with an episode of gross hematuria who presents today for cystoscopy to complete her workup.  She underwent CT urogram on 10/25/2017 which showed an incidental 3 mm right renal calculus, otherwise no GU pathology.  She does mention today that she is very bothered by urinary frequency/urgency.  This is been going on for at least 10 years.  She does drink 24 ounces of coffee in the morning but this is a fairly new habit.  Even prior to drinking coffee, she had these severe urinary symptoms.  She denies any dysuria associated with this.  No incontinence.  She previously tried Detrol without much effect.  She is interested in further intervention for this.  Blood pressure 96/60, pulse 84, height 5' (1.524 m), weight 103 lb (46.7 kg). NED. A&Ox3.   No respiratory distress   Abd soft, NT, ND Normal external genitalia with small urethral caruncle.  Cystoscopy Procedure Note  Patient identification was confirmed, informed consent was obtained, and patient was prepped using Betadine solution.  Lidocaine jelly was administered per urethral meatus.    Preoperative abx where received prior to procedure.     Pre-Procedure: - Inspection reveals a normal caliber ureteral meatus.  Procedure: The flexible cystoscope was introduced without difficulty - No urethral strictures/lesions are present. - Normal bladder neck - Bilateral ureteral orifices identified with mild denies extending from the bladder neck to the mid trigone - Bladder mucosa  reveals no ulcers, tumors, or lesions - No bladder stones - No trabeculation  Retroflexion unremarkable   Post-Procedure: - Patient tolerated the procedure well  Assessment/ Plan:  1. Gross hematuria Etiology unclear Cysto today without significant pathology other than caruncle (asymptomatic)  - Urinalysis, Complete  2. Urinary frequency  Longstanding  urgency/ frequency Behavioral modification discussed Mybetriq 25 mg x 4 week samples given Return in 4 weeks for BP/ PVR, symptoms recheck  3. Kidney stones Incidental asymptomatic 3 mm right kidney stone on CT No intervention recommended  Return in about 4 weeks (around 11/26/2017) for f/u OAB symptoms, PVR.   Hollice Espy, MD

## 2017-11-26 ENCOUNTER — Encounter: Payer: Self-pay | Admitting: Urology

## 2017-11-26 ENCOUNTER — Ambulatory Visit: Payer: Managed Care, Other (non HMO) | Admitting: Urology

## 2017-11-26 VITALS — BP 130/64 | HR 102 | Ht 60.0 in | Wt 105.0 lb

## 2017-11-26 DIAGNOSIS — R350 Frequency of micturition: Secondary | ICD-10-CM | POA: Diagnosis not present

## 2017-11-26 NOTE — Progress Notes (Signed)
11/26/2017 3:12 PM   Debbie Sanchez November 27, 1955 [id number]  Referring provider: Crecencio Mc, MD [place], [place] [address]  Chief Complaint  Patient presents with  . Over Active Bladder    5month    HPI: 62 year old female to recently underwent workup including CT urogram as well as cystoscopy for further evaluation of gross hematuria.  CT urogram on 10/2017 was negative other than an incidental 3 mm right renal calculus which is asymptomatic.  Cystoscopy was unremarkable.  At the time of visit, she reports that she was fairly bothered by urgency and frequency.  This was long-standing for at least 10 years.  She is previously tried Detrol in the remote past without much effect.  She was given Myrbetriq last visit 25 mg however, after reading the package insert, stop this medication after only 2 days.  She is unsure whether or not this helped her.  She has been doing a lot of reading about behavioral modification.  He has been working to balance her oral intake with her urinary symptoms and is found a fairly good balance which his acceptable.   PMH: Past Medical History:  Diagnosis Date  . Fibrocystic breast disease    rt breast aspiration 2002  . Menopause    last menses 2008    Surgical History: Past Surgical History:  Procedure Laterality Date  . BREAST BIOPSY Right 2010   cyst aspiration/US guided biopsy, clip placement  . BREAST CYST ASPIRATION Right 2002   cyst aspiration  . CESAREAN SECTION  1998  . MOUTH SURGERY    . TUBAL LIGATION      Home Medications:  Allergies as of 11/26/2017   No Known Allergies     Medication List        Accurate as of 11/26/17  3:12 PM. Always use your most recent med list.          aspirin 81 MG EC tablet Take 81 mg by mouth daily. Swallow whole.   CALCIUM 500 PO Take 1 tablet by mouth daily.   cyclobenzaprine 5 MG tablet Commonly known as:  FLEXERIL Take 1 tablet (5 mg total) by mouth 3  (three) times daily as needed for muscle spasms.   MELATONIN ER PO Take by mouth.   meloxicam 15 MG tablet Commonly known as:  MOBIC Take 1 tablet (15 mg total) by mouth daily.   multivitamin tablet Take 1 tablet by mouth daily.   vitamin C 100 MG tablet Take 1,000 mg by mouth 2 (two) times daily.       Allergies: No Known Allergies  Family History: Family History  Problem Relation Age of Onset  . Cancer Mother        colon CA / resected completely 1999  . Cancer Maternal Aunt        breast CA  . Breast cancer Maternal Aunt        2 mat great aunts    Social History:  reports that she has never smoked. She has never used smokeless tobacco. She reports that she drinks alcohol. She reports that she does not use drugs.  ROS: UROLOGY Frequent Urination?: Yes Hard to postpone urination?: No Burning/pain with urination?: No Get up at night to urinate?: Yes Leakage of urine?: No Urine stream starts and stops?: No Trouble starting stream?: No Do you have to strain to urinate?: No Blood in urine?: No Urinary tract infection?: No Sexually transmitted disease?: No Injury to kidneys or bladder?:  No Painful intercourse?: No Weak stream?: No Currently pregnant?: No Vaginal bleeding?: No Last menstrual period?: n  Gastrointestinal Nausea?: No Vomiting?: No Indigestion/heartburn?: No Diarrhea?: No Constipation?: No  Constitutional Fever: No Night sweats?: No Weight loss?: No Fatigue?: No  Skin Skin rash/lesions?: No Itching?: No  Eyes Blurred vision?: No Double vision?: No  Ears/Nose/Throat Sore throat?: No Sinus problems?: No  Hematologic/Lymphatic Swollen glands?: No Easy bruising?: No  Cardiovascular Leg swelling?: No Chest pain?: No  Respiratory Cough?: No Shortness of breath?: No  Endocrine Excessive thirst?: No  Musculoskeletal Back pain?: No Joint pain?: No  Neurological Headaches?: No Dizziness?:  No  Psychologic Depression?: No Anxiety?: No  Physical Exam: BP 130/64   Pulse (!) 102   Ht 5' (1.524 m)   Wt 105 lb (47.6 kg)   BMI 20.51 kg/m   Constitutional:  Alert and oriented, No acute distress. HEENT: [place] AT, moist mucus membranes.  Trachea midline, no masses. Cardiovascular: No clubbing, cyanosis, or edema. Respiratory: Normal respiratory effort, no increased work of breathing. Skin: No rashes, bruises or suspicious lesions. Neurologic: Grossly intact, no focal deficits, moving all 4 extremities. Psychiatric: Normal mood and affect.  Laboratory Data: Lab Results  Component Value Date   WBC 6.2 06/15/2016   HGB 13.9 06/15/2016   HCT 42.4 06/15/2016   MCV 90 06/15/2016   PLT 241 06/15/2016    Lab Results  Component Value Date   CREATININE 0.60 10/25/2017     Urinalysis N/a  Pertinent Imaging: N/a  Assessment & Plan:    1. Urinary frequency Elected not to pursue oral medical therapy, desires behavioral modification with reasonable improvement in her symptoms We had a fairly extensive conversation today about the algorithm for treatment of OAB and urinary frequency including medications, as well as treatment for refractory symptoms including Botox/PT NS In the future, she may be interested in therapies such as a posterior nerve stimulation of her symptoms worsen  At this point time, she will follow-up as needed and continue behavioral modification  She will call for nurse visit if she has any urinary symptoms concerning for infection for nurse visit  Hollice Espy, MD  [place] [address] [place], [place] [address] [phone]

## 2018-03-19 ENCOUNTER — Encounter: Payer: Self-pay | Admitting: Internal Medicine

## 2018-06-09 ENCOUNTER — Ambulatory Visit (INDEPENDENT_AMBULATORY_CARE_PROVIDER_SITE_OTHER): Payer: Managed Care, Other (non HMO) | Admitting: Internal Medicine

## 2018-06-09 ENCOUNTER — Encounter: Payer: Self-pay | Admitting: Internal Medicine

## 2018-06-09 VITALS — BP 104/58 | HR 83 | Temp 98.7°F | Resp 14 | Ht 59.5 in | Wt 103.6 lb

## 2018-06-09 DIAGNOSIS — Z1239 Encounter for other screening for malignant neoplasm of breast: Secondary | ICD-10-CM | POA: Diagnosis not present

## 2018-06-09 DIAGNOSIS — Z791 Long term (current) use of non-steroidal anti-inflammatories (NSAID): Secondary | ICD-10-CM | POA: Diagnosis not present

## 2018-06-09 DIAGNOSIS — Z0000 Encounter for general adult medical examination without abnormal findings: Secondary | ICD-10-CM | POA: Diagnosis not present

## 2018-06-09 DIAGNOSIS — G250 Essential tremor: Secondary | ICD-10-CM | POA: Diagnosis not present

## 2018-06-09 DIAGNOSIS — M62838 Other muscle spasm: Secondary | ICD-10-CM

## 2018-06-09 DIAGNOSIS — E673 Hypervitaminosis D: Secondary | ICD-10-CM

## 2018-06-09 MED ORDER — ZOSTER VAC RECOMB ADJUVANTED 50 MCG/0.5ML IM SUSR: 0.5000 mL | Freq: Once | INTRAMUSCULAR | 1 refills | Status: AC

## 2018-06-09 NOTE — Progress Notes (Signed)
Patient ID: Debbie Sanchez, female    DOB: 1956/04/27  Age: 62 y.o. MRN: [id number]  The patient is here for annual preventive  examination and management of other chronic and acute problems.   Colonoscopy by  Debbie Sanchez  At [facility] in March:  No results but per patient normal.  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  The roster of all physicians providing medical care to patient - is listed in the Snapshot section of the chart.  Activities of daily living:  The patient is 100% independent in all ADLs: dressing, toileting, feeding as well as independent mobility  Home safety : The patient has smoke detectors in the home. They wear seatbelts.  There are no firearms at home. There is no violence in the home.   There is no risks for hepatitis, STDs or HIV. There is no   history of blood transfusion. They have no travel history to infectious disease endemic areas of the world.  The patient has seen their dentist in the last six month. They have seen their eye doctor in the last year. They admit to slight hearing difficulty with regard to whispered voices and some television programs.  They have deferred audiologic testing in the last year.  They do not  have excessive sun exposure. Discussed the need for sun protection: hats, long sleeves and use of sunscreen if there is significant sun exposure.   Diet: the importance of a healthy diet is discussed. They do have a healthy diet.  The benefits of regular aerobic exercise were discussed. She walks 4 times per week ,  20 minutes.   Depression screen: there are no signs or vegative symptoms of depression- irritability, change in appetite, anhedonia, sadness/tearfullness.  Cognitive assessment: the patient  manages all their financial and personal affairs and is actively engaged. They could relate day,date,year and events; recalled 2/3 objects at 3 minutes; performed clock-face test normally.  The following portions of the patient's history were reviewed and updated as appropriate: allergies, current medications, past family history, past medical history,  past surgical history, past social history  and problem list.  Visual acuity was not assessed per patient preference since she has regular follow up with her ophthalmologist. Hearing and body mass index were assessed and reviewed.   During the course of the visit the patient was educated and counseled about appropriate screening and preventive services including : fall prevention , diabetes screening, nutrition counseling, colorectal cancer screening, and recommended immunizations.    CC: The primary encounter diagnosis was Long term (current) use of non-steroidal anti-inflammatories (nsaid). Diagnoses of Breast cancer screening, Benign essential tremor, Encounter for preventive health examination, Hypervitaminosis D, and Trapezius muscle spasm were also pertinent to this visit.   Vocation: Brewing technologist.  Lots of heavy lifting, which occasionally  Bothers her right shoulder.  Vocational exposure to clay dust , wears a mask when needed.   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   [name] has a past medical history of Fibrocystic breast disease and Menopause.   She has a past surgical history that includes Tubal ligation; Cesarean section (1998); Mouth surgery; Breast biopsy (Right, 2010);  and Breast cyst aspiration (Right, 2002).   Her family history includes Breast cancer in her maternal aunt; Cancer in her maternal aunt; Cancer (age of onset: 72) in her mother.She reports that she has never smoked. She has never used smokeless tobacco. She reports that she drinks alcohol. She reports that she does not  use drugs.  Outpatient Medications Prior to Visit  Medication Sig Dispense Refill  . Ascorbic Acid (VITAMIN C) 100 MG tablet Take 1,000 mg by mouth 2 (two) times daily.     Marland Kitchen aspirin 81 MG EC tablet Take 81 mg by mouth daily. Swallow whole.    Marland Kitchen MELATONIN ER PO Take by mouth.    . Multiple Vitamin (MULTIVITAMIN) tablet Take 1 tablet by mouth daily.    . cyclobenzaprine (FLEXERIL) 5 MG tablet Take 1 tablet (5 mg total) by mouth 3 (three) times daily as needed for muscle spasms. (Patient not taking: Reported on 06/09/2018) 30 tablet 1  . meloxicam (MOBIC) 15 MG tablet Take 1 tablet (15 mg total) by mouth daily. (Patient not taking: Reported on 06/09/2018) 30 tablet 0  . Calcium Carbonate (CALCIUM 500 PO) Take 1 tablet by mouth daily.     No facility-administered medications prior to visit.     Review of Systems   Patient denies headache, fevers, malaise, unintentional weight loss, skin rash, eye pain, sinus congestion and sinus pain, sore throat, dysphagia,  hemoptysis , cough, dyspnea, wheezing, chest pain, palpitations, orthopnea, edema, abdominal pain, nausea, melena, diarrhea, constipation, flank pain, dysuria, hematuria, urinary  Frequency, nocturia, numbness, tingling, seizures,  Focal weakness, Loss of consciousness,  Tremor, insomnia, depression, anxiety, and suicidal ideation.      Objective:  BP (!) 104/58 (BP Location: Left Arm, Patient Position: Sitting, Cuff Size: Normal)   Pulse 83   Temp 98.7 F (37.1 C) (Oral)   Resp 14   Ht 4' 11.5" (1.511 m)   Wt 103 lb 9.6 oz (47 kg)   SpO2 97%   BMI 20.57 kg/m   Physical Exam   General appearance: alert, cooperative and appears stated age Head: Normocephalic, without obvious abnormality, atraumatic Eyes: conjunctivae/corneas clear. PERRL, EOM's intact. Fundi benign. Ears: normal TM's and external ear canals both ears Nose: Nares normal. Septum midline. Mucosa normal. No drainage or sinus tenderness. Throat: lips, mucosa, and  tongue normal; teeth and gums normal Neck: no adenopathy, no carotid bruit, no JVD, supple, symmetrical, trachea midline and thyroid not enlarged, symmetric, no tenderness/mass/nodules Lungs: clear to auscultation bilaterally Breasts: normal appearance, no masses or tenderness Heart: regular rate and rhythm, S1, S2 normal, no murmur, click, rub or gallop Abdomen: soft, non-tender; bowel sounds normal; no masses,  no organomegaly Extremities: extremities normal, atraumatic, no cyanosis or edema Pulses: 2+ and symmetric Skin: Skin color, texture, turgor normal. No rashes or lesions.  Well healed vertical midline incisional scar mid thoracic spine  Neuro: CNs 2-12 intact. DTRs 2+/4 in biceps, brachioradialis, patellars and achilles. Muscle strength 5/5 in upper and lower exremities. Fine intention bilaterally both hands cerebellar function normal. Romberg negative.  No pronator drift.   Gait normal.    Assessment & Plan:   Problem List Items Addressed This Visit    Benign essential tremor    Has not progressed by today's exam.      Encounter for preventive health examination    age appropriate education and counseling updated, referrals for preventative services and immunizations addressed, dietary and smoking counseling addressed, most recent labs reviewed.  I have personally reviewed  and have noted:  1) the patient's medical and social history 2) The pt's use of alcohol, tobacco, and illicit drugs 3) The patient's current medications and supplements 4) Functional ability including ADL's, fall risk, home safety risk, hearing and visual impairment 5) Diet and physical activities 6) Evidence for depression or mood disorder 7) The patient's height, weight, and BMI have been recorded in the chart  I have made referrals, and provided counseling and education based on review of the above      Hypervitaminosis D    She is taking only minimal amounts of Vitamin D found in her multivitamin       Trapezius muscle spasm    Recurrent brought on by heavy lifting and pottery making.        Other Visit Diagnoses    Long term (current) use of non-steroidal anti-inflammatories (nsaid)    -  Primary   Relevant Orders   Comprehensive metabolic panel   Breast cancer screening       Relevant Orders   MM 3D SCREEN BREAST BILATERAL      I have discontinued Sula Rumple. Silvestri's Calcium Carbonate (CALCIUM 500 PO). I am also having her start on Zoster Vaccine Adjuvanted. Additionally, I am having her maintain her multivitamin, vitamin C, MELATONIN ER PO, aspirin, meloxicam, and cyclobenzaprine.  Meds ordered this encounter  Medications  . Zoster Vaccine Adjuvanted [facility]) injection    Sig: Inject 0.5 mLs into the muscle once for 1 dose.    Dispense:  1 each    Refill:  1    Medications Discontinued During This Encounter  Medication Reason  . Calcium Carbonate (CALCIUM 500 PO) Patient has not taken in last 30 days    Follow-up: Return in about 1 year (around 06/10/2019) for CPE  no pap.   Crecencio Mc, MD

## 2018-06-09 NOTE — Patient Instructions (Addendum)
You can use up to 2400 mg of motrin daily. DO NOT COMBINE WITH MELOXICAM   You can add up to 2000 mg of acetominophen (tylenol) every day safely  In divided doses (500 mg every 6 hours  Or 1000 mg every 12 hours.)  Limit aspirin to 324 mg daily  To avoid  GI bleeds   The ShingRx vaccine is now available in local pharmacies and is much more protective thant Zostavaxs,  It is therefore ADVISED for all interested adults over 50 to prevent shingles    Your annual mammogram has been ordered.  You are encouraged (required) to call to make your appointment at [facility]  [phone]   Health Maintenance for Postmenopausal Women Menopause is a normal process in which your reproductive ability comes to an end. This process happens gradually over a span of months to years, usually between the ages of 48 and 55. Menopause is complete when you have missed 12 consecutive menstrual periods. It is important to talk with your health care provider about some of the most common conditions that affect postmenopausal women, such as heart disease, cancer, and bone loss (osteoporosis). Adopting a healthy lifestyle and getting preventive care can help to promote your health and wellness. Those actions can also lower your chances of developing some of these common conditions. What should I know about menopause? During menopause, you may experience a number of symptoms, such as:  Moderate-to-severe hot flashes.  Night sweats.  Decrease in sex drive.  Mood swings.  Headaches.  Tiredness.  Irritability.  Memory problems.  Insomnia.  Choosing to treat or not to treat menopausal changes is an individual decision that you make with your health care provider. What should I know about hormone replacement therapy and supplements? Hormone therapy products are effective for treating symptoms that are associated with menopause, such as hot flashes and night sweats. Hormone replacement carries certain risks,  especially as you become older. If you are thinking about using estrogen or estrogen with progestin treatments, discuss the benefits and risks with your health care provider. What should I know about heart disease and stroke? Heart disease, heart attack, and stroke become more likely as you age. This may be due, in part, to the hormonal changes that your body experiences during menopause. These can affect how your body processes dietary fats, triglycerides, and cholesterol. Heart attack and stroke are both medical emergencies. There are many things that you can do to help prevent heart disease and stroke:  Have your blood pressure checked at least every 1-2 years. High blood pressure causes heart disease and increases the risk of stroke.  If you are 55-79 years old, ask your health care provider if you should take aspirin to prevent a heart attack or a stroke.  Do not use any tobacco products, including cigarettes, chewing tobacco, or electronic cigarettes. If you need help quitting, ask your health care provider.  It is important to eat a healthy diet and maintain a healthy weight. ? Be sure to include plenty of vegetables, fruits, low-fat dairy products, and lean protein. ? Avoid eating foods that are high in solid fats, added sugars, or salt (sodium).  Get regular exercise. This is one of the most important things that you can do for your health. ? Try to exercise for at least 150 minutes each week. The type of exercise that you do should increase your heart rate and make you sweat. This is known as moderate-intensity exercise. ? Try to do strengthening   exercises at least twice each week. Do these in addition to the moderate-intensity exercise.  Know your numbers.Ask your health care provider to check your cholesterol and your blood glucose. Continue to have your blood tested as directed by your health care provider.  What should I know about cancer screening? There are several types of  cancer. Take the following steps to reduce your risk and to catch any cancer development as early as possible. Breast Cancer  Practice breast self-awareness. ? This means understanding how your breasts normally appear and feel. ? It also means doing regular breast self-exams. Let your health care provider know about any changes, no matter how small.  If you are 40 or older, have a clinician do a breast exam (clinical breast exam or CBE) every year. Depending on your age, family history, and medical history, it may be recommended that you also have a yearly breast X-ray (mammogram).  If you have a family history of breast cancer, talk with your health care provider about genetic screening.  If you are at high risk for breast cancer, talk with your health care provider about having an MRI and a mammogram every year.  Breast cancer (BRCA) gene test is recommended for women who have family members with BRCA-related cancers. Results of the assessment will determine the need for genetic counseling and BRCA1 and for BRCA2 testing. BRCA-related cancers include these types: ? Breast. This occurs in males or females. ? Ovarian. ? Tubal. This may also be called fallopian tube cancer. ? Cancer of the abdominal or pelvic lining (peritoneal cancer). ? Prostate. ? Pancreatic.  Cervical, Uterine, and Ovarian Cancer Your health care provider may recommend that you be screened regularly for cancer of the pelvic organs. These include your ovaries, uterus, and vagina. This screening involves a pelvic exam, which includes checking for microscopic changes to the surface of your cervix (Pap test).  For women ages 21-65, health care providers may recommend a pelvic exam and a Pap test every three years. For women ages 30-65, they may recommend the Pap test and pelvic exam, combined with testing for human papilloma virus (HPV), every five years. Some types of HPV increase your risk of cervical cancer. Testing for HPV  may also be done on women of any age who have unclear Pap test results.  Other health care providers may not recommend any screening for nonpregnant women who are considered low risk for pelvic cancer and have no symptoms. Ask your health care provider if a screening pelvic exam is right for you.  If you have had past treatment for cervical cancer or a condition that could lead to cancer, you need Pap tests and screening for cancer for at least 20 years after your treatment. If Pap tests have been discontinued for you, your risk factors (such as having a new sexual partner) need to be reassessed to determine if you should start having screenings again. Some women have medical problems that increase the chance of getting cervical cancer. In these cases, your health care provider may recommend that you have screening and Pap tests more often.  If you have a family history of uterine cancer or ovarian cancer, talk with your health care provider about genetic screening.  If you have vaginal bleeding after reaching menopause, tell your health care provider.  There are currently no reliable tests available to screen for ovarian cancer.  Lung Cancer Lung cancer screening is recommended for adults 55-80 years old who are at high risk for   lung cancer because of a history of smoking. A yearly low-dose CT scan of the lungs is recommended if you:  Currently smoke.  Have a history of at least 30 pack-years of smoking and you currently smoke or have quit within the past 15 years. A pack-year is smoking an average of one pack of cigarettes per day for one year.  Yearly screening should:  Continue until it has been 15 years since you quit.  Stop if you develop a health problem that would prevent you from having lung cancer treatment.  Colorectal Cancer  This type of cancer can be detected and can often be prevented.  Routine colorectal cancer screening usually begins at age 77 and continues through age  12.  If you have risk factors for colon cancer, your health care provider may recommend that you be screened at an earlier age.  If you have a family history of colorectal cancer, talk with your health care provider about genetic screening.  Your health care provider may also recommend using home test kits to check for hidden blood in your stool.  A small camera at the end of a tube can be used to examine your colon directly (sigmoidoscopy or colonoscopy). This is done to check for the earliest forms of colorectal cancer.  Direct examination of the colon should be repeated every 5-10 years until age 23. However, if early forms of precancerous polyps or small growths are found or if you have a family history or genetic risk for colorectal cancer, you may need to be screened more often.  Skin Cancer  Check your skin from head to toe regularly.  Monitor any moles. Be sure to tell your health care provider: ? About any new moles or changes in moles, especially if there is a change in a mole's shape or color. ? If you have a mole that is larger than the size of a pencil eraser.  If any of your family members has a history of skin cancer, especially at a young age, talk with your health care provider about genetic screening.  Always use sunscreen. Apply sunscreen liberally and repeatedly throughout the day.  Whenever you are outside, protect yourself by wearing long sleeves, pants, a wide-brimmed hat, and sunglasses.  What should I know about osteoporosis? Osteoporosis is a condition in which bone destruction happens more quickly than new bone creation. After menopause, you may be at an increased risk for osteoporosis. To help prevent osteoporosis or the bone fractures that can happen because of osteoporosis, the following is recommended:  If you are 15-32 years old, get at least 1,000 mg of calcium and at least 600 mg of vitamin D per day.  If you are older than age 1 but younger than age  76, get at least 1,200 mg of calcium and at least 600 mg of vitamin D per day.  If you are older than age 46, get at least 1,200 mg of calcium and at least 800 mg of vitamin D per day.  Smoking and excessive alcohol intake increase the risk of osteoporosis. Eat foods that are rich in calcium and vitamin D, and do weight-bearing exercises several times each week as directed by your health care provider. What should I know about how menopause affects my mental health? Depression may occur at any age, but it is more common as you become older. Common symptoms of depression include:  Low or sad mood.  Changes in sleep patterns.  Changes in appetite or eating  patterns.  Feeling an overall lack of motivation or enjoyment of activities that you previously enjoyed.  Frequent crying spells.  Talk with your health care provider if you think that you are experiencing depression. What should I know about immunizations? It is important that you get and maintain your immunizations. These include:  Tetanus, diphtheria, and pertussis (Tdap) booster vaccine.  Influenza every year before the flu season begins.  Pneumonia vaccine.  Shingles vaccine.  Your health care provider may also recommend other immunizations. This information is not intended to replace advice given to you by your health care provider. Make sure you discuss any questions you have with your health care provider. Document Released: 09/28/2005 Document Revised: 02/24/2016 Document Reviewed: 05/10/2015 Elsevier Interactive Patient Education  2018 Elsevier Inc.      

## 2018-06-10 LAB — COMPREHENSIVE METABOLIC PANEL
ALK PHOS: 79 U/L (ref 39–117)
ALT: 20 U/L (ref 0–35)
AST: 18 U/L (ref 0–37)
Albumin: 4.2 g/dL (ref 3.5–5.2)
BUN: 12 mg/dL (ref 6–23)
CO2: 32 meq/L (ref 19–32)
Calcium: 9.4 mg/dL (ref 8.4–10.5)
Chloride: 102 mEq/L (ref 96–112)
Creatinine, Ser: 0.65 mg/dL (ref 0.40–1.20)
GFR: 98.14 mL/min (ref >60.00)
GLUCOSE: 98 mg/dL (ref 70–99)
POTASSIUM: 3.6 meq/L (ref 3.5–5.1)
Sodium: 140 mEq/L (ref 135–145)
Total Bilirubin: 0.5 mg/dL (ref 0.2–1.2)
Total Protein: 6.8 g/dL (ref 6.0–8.3)

## 2018-06-10 NOTE — Assessment & Plan Note (Signed)

## 2018-06-10 NOTE — Assessment & Plan Note (Signed)
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

## 2018-06-10 NOTE — Assessment & Plan Note (Signed)
She is taking only minimal amounts of Vitamin D found in her multivitamin

## 2018-06-10 NOTE — Assessment & Plan Note (Signed)
Has not progressed by today's exam.

## 2018-08-04 ENCOUNTER — Other Ambulatory Visit: Payer: Self-pay | Admitting: Internal Medicine

## 2018-08-04 ENCOUNTER — Ambulatory Visit
Admission: RE | Admit: 2018-08-04 | Discharge: 2018-08-04 | Disposition: A | Discharge status: Home / Self Care | Payer: Managed Care, Other (non HMO) | Source: Ambulatory Visit | Attending: Internal Medicine | Admitting: Internal Medicine

## 2018-08-04 DIAGNOSIS — N6489 Other specified disorders of breast: Secondary | ICD-10-CM

## 2018-08-04 DIAGNOSIS — Z1239 Encounter for other screening for malignant neoplasm of breast: Secondary | ICD-10-CM | POA: Insufficient documentation

## 2018-08-04 DIAGNOSIS — R928 Other abnormal and inconclusive findings on diagnostic imaging of breast: Secondary | ICD-10-CM

## 2018-08-15 ENCOUNTER — Ambulatory Visit
Admission: RE | Admit: 2018-08-15 | Discharge: 2018-08-15 | Disposition: A | Discharge status: Home / Self Care | Payer: Managed Care, Other (non HMO) | Source: Ambulatory Visit | Attending: Internal Medicine | Admitting: Internal Medicine

## 2018-08-15 DIAGNOSIS — N6489 Other specified disorders of breast: Secondary | ICD-10-CM

## 2018-08-15 DIAGNOSIS — R928 Other abnormal and inconclusive findings on diagnostic imaging of breast: Secondary | ICD-10-CM | POA: Insufficient documentation

## 2018-08-22 DIAGNOSIS — Z0000 Encounter for general adult medical examination without abnormal findings: Secondary | ICD-10-CM

## 2018-08-22 NOTE — Telephone Encounter (Signed)
I have completed the form except for lipid and placed in quick sign she will need lipid and waist circumference.

## 2018-08-28 ENCOUNTER — Other Ambulatory Visit (INDEPENDENT_AMBULATORY_CARE_PROVIDER_SITE_OTHER): Payer: Managed Care, Other (non HMO)

## 2018-08-28 DIAGNOSIS — Z0000 Encounter for general adult medical examination without abnormal findings: Secondary | ICD-10-CM | POA: Diagnosis not present

## 2018-08-28 LAB — LIPID PANEL
CHOL/HDL RATIO: 3
CHOLESTEROL: 221 mg/dL — AB (ref 0–200)
HDL: 68.2 mg/dL (ref >39.00)
LDL CALC: 143 mg/dL — AB (ref 0–99)
NonHDL: 153.25
Triglycerides: 52 mg/dL (ref 0.0–149.0)
VLDL: 10.4 mg/dL (ref 0.0–40.0)

## 2018-09-05 ENCOUNTER — Telehealth: Payer: Self-pay

## 2018-09-05 NOTE — Telephone Encounter (Signed)
LMTCB. Need to let pt know that her wellness screening form is complete but we can not fax it until she signs it. The form has been placed up front for pt to come by and sign. P[facility] may speak with pt.

## 2018-09-05 NOTE — Telephone Encounter (Signed)
Form was signed and faxed

## 2018-09-05 NOTE — Telephone Encounter (Signed)
Pt coming to the office now to pick up form.

## 2019-02-24 ENCOUNTER — Telehealth: Payer: Self-pay | Admitting: Urology

## 2019-02-24 NOTE — Progress Notes (Signed)
02/25/2019 10:43 AM   Debbie Sanchez 16-Jul-1956 [id number]  Referring provider: Crecencio Mc, MD [address] [place],  [place] [address]  Chief Complaint  Patient presents with  . Hematuria    HPI: Debbie Sanchez is a 63 year old female with a history of hematuria, frequency and kidney stones who presents today for an urgent appointment for gross hematuria.    History of hematuria (high risk) Non-smoker.  CTU completed in 10/2017 noted the adrenal glands normal. Nonobstructing calculus in the mid RIGHT kidney measures 3 mm. No ureterolithiasis or obstructive uropathy. No enhancing renal lesion. Delayed imaging demonstrates no filling defects within collecting systems or ureters.  No bladder stones or filling defects in the bladder which does not excluded a bladder lesion.  Cystoscopy by Dr. 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  Her UA today is 11-30 WBC's and 3-10 RBC's.   She is having frequency, dysuria, nocturia and gross hematuria.      Frequency Failed anticholinergics.  Did not take Myrbetriq due to potential side effects.  Discussed Botox, InterStim and PTNS in the past.   Her PVR is 108 mL .    Right renal stone 3 mm right renal stone found incidentally on CTU in 10/2017.  No complaint of flank pain at this time.    PMH: Past Medical History:  Diagnosis Date  . Fibrocystic breast disease    rt breast aspiration 2002  . Menopause    last menses 2008    Surgical History: Past Surgical History:  Procedure Laterality Date  . BREAST BIOPSY Right 2010   cyst aspiration/US guided biopsy, clip placement  . BREAST CYST ASPIRATION Right 2002   cyst aspiration  . CESAREAN SECTION  1998  . MOUTH SURGERY    . TUBAL LIGATION      Home Medications:  Allergies as of 02/25/2019   No Known Allergies     Medication List       Accurate as of February 25, 2019 10:43 AM. If you have any questions, ask your nurse or doctor.        STOP taking these  medications   cyclobenzaprine 5 MG tablet Commonly known as: FLEXERIL Stopped by: Sincere Berlanga, PA-C   meloxicam 15 MG tablet Commonly known as: MOBIC Stopped by: Zara Council, PA-C     TAKE these medications   aspirin 81 MG EC tablet Take 81 mg by mouth daily. Swallow whole.   cholecalciferol 25 MCG (1000 UT) tablet Commonly known as: VITAMIN D3 Take 1,000 Units by mouth daily.   MELATONIN ER PO Take by mouth.   multivitamin tablet Take 1 tablet by mouth daily.   sulfamethoxazole-trimethoprim 800-160 MG tablet Commonly known as: BACTRIM DS Take 1 tablet by mouth every 12 (twelve) hours. Started by: Zara Council, PA-C   Vitamin B 12 500 MCG Tabs Take by mouth.   vitamin C 100 MG tablet Take 1,000 mg by mouth 2 (two) times daily.   vitamin E 600 UNIT capsule Take 600 Units by mouth daily.   zinc gluconate 50 MG tablet Take 50 mg by mouth daily.       Allergies: No Known Allergies  Family History: Family History  Problem Relation Age of Onset  . Cancer Mother 63       colon CA / resected completely 1999  . Cancer Maternal Aunt        breast CA  . Breast cancer Maternal Aunt  2 mat great aunts    Social History:  reports that she has never smoked. She has never used smokeless tobacco. She reports current alcohol use. She reports that she does not use drugs.  ROS: UROLOGY Frequent Urination?: Yes Hard to postpone urination?: No Burning/pain with urination?: Yes Get up at night to urinate?: Yes Leakage of urine?: No Urine stream starts and stops?: No Trouble starting stream?: No Do you have to strain to urinate?: No Blood in urine?: Yes Urinary tract infection?: No Sexually transmitted disease?: No Injury to kidneys or bladder?: No Painful intercourse?: No Weak stream?: No Currently pregnant?: No Vaginal bleeding?: No Last menstrual period?: n  Gastrointestinal Nausea?: No Vomiting?: No Indigestion/heartburn?: No Diarrhea?:  No Constipation?: No  Constitutional Fever: No Night sweats?: No Weight loss?: No Fatigue?: Yes  Skin Skin rash/lesions?: No Itching?: No  Eyes Blurred vision?: No Double vision?: No  Ears/Nose/Throat Sore throat?: No Sinus problems?: No  Hematologic/Lymphatic Swollen glands?: No Easy bruising?: No  Cardiovascular Leg swelling?: No Chest pain?: No  Respiratory Cough?: No Shortness of breath?: No  Endocrine Excessive thirst?: No  Musculoskeletal Back pain?: No Joint pain?: No  Neurological Headaches?: No Dizziness?: No  Psychologic Depression?: No Anxiety?: No  Physical Exam: BP 112/76 (BP Location: Left Arm, Patient Position: Sitting, Cuff Size: Normal)   Pulse 87   Ht 4' 11.5" (1.511 m)   Wt 102 lb (46.3 kg)   BMI 20.26 kg/m   Constitutional:  Well nourished. Alert and oriented, No acute distress. HEENT: [place] AT, moist mucus membranes.  Trachea midline, no masses. Cardiovascular: No clubbing, cyanosis, or edema. Respiratory: Normal respiratory effort, no increased work of breathing. Neurologic: Grossly intact, no focal deficits, moving all 4 extremities. Psychiatric: Normal mood and affect.   Laboratory Data: Lab Results  Component Value Date   WBC 6.2 06/15/2016   HGB 13.9 06/15/2016   HCT 42.4 06/15/2016   MCV 90 06/15/2016   PLT 241 06/15/2016    Lab Results  Component Value Date   CREATININE 0.65 06/09/2018    No results found for: PSA  No results found for: TESTOSTERONE  No results found for: HGBA1C  Lab Results  Component Value Date   TSH 1.710 06/15/2016       Component Value Date/Time   CHOL 221 (H) 08/28/2018 0819   CHOL 198 06/12/2017 0830   HDL 68.20 08/28/2018 0819   HDL 75 06/12/2017 0830   CHOLHDL 3 08/28/2018 0819   VLDL 10.4 08/28/2018 0819   LDLCALC 143 (H) 08/28/2018 0819   LDLCALC 115 (H) 06/12/2017 0830    Lab Results  Component Value Date   AST 18 06/09/2018   Lab Results  Component Value Date    ALT 20 06/09/2018   No components found for: ALKALINEPHOPHATASE No components found for: BILIRUBINTOTAL  No results found for: ESTRADIOL   Urinalysis 11-30 WBC's and 3-10 RBC's.  See Epic.  I have reviewed the labs.  Pertinent Imaging: Results for Debbie Sanchez (MRN [id number]) as of 02/25/2019 10:43  Ref. Range 02/25/2019 10:09  Scan Result Unknown 108    Assessment & Plan:    1. History of hematuria (high risk) Hematuria work up completed in 10/2017 - findings positive for urethral caruncle  Report of gross hematuria  UA today with 11-30 WBC's and 3-10 RBC's.   Explained to the patient that there are a number of causes that can be associated with blood in the urine, such as stones, UTI's, damage to the urinary  tract and/or cancer. Discussed that she had completed a hematuria work up in 10/2017 and no etiology was discovered for the gross hematuria at that time We will send the urine for culture and start Septra empirically  2. Frequency Likely due to UTI  3. Right renal stone No intervention at this time  Return for pending urine culture resutls .  These notes generated with voice recognition software. I apologize for typographical errors.  Zara Council, PA-C  [facility] [address] [place]  [place], [place] [address] [phone]

## 2019-02-24 NOTE — Telephone Encounter (Signed)
Pt called and states that she had blood in her urine last night for several hours. Today there is none so far, she would like to know if she needs to be seen. She denies any pain fevers or nausea. Please advise.

## 2019-02-24 NOTE — Telephone Encounter (Signed)
Patient has not been seen in over a year she needs a follow up with soonest available provider

## 2019-02-25 ENCOUNTER — Encounter: Payer: Self-pay | Admitting: Urology

## 2019-02-25 ENCOUNTER — Other Ambulatory Visit: Payer: Self-pay

## 2019-02-25 ENCOUNTER — Ambulatory Visit: Payer: Managed Care, Other (non HMO) | Admitting: Urology

## 2019-02-25 VITALS — BP 112/76 | HR 87 | Ht 59.5 in | Wt 102.0 lb

## 2019-02-25 DIAGNOSIS — R310 Gross hematuria: Secondary | ICD-10-CM | POA: Diagnosis not present

## 2019-02-25 DIAGNOSIS — N200 Calculus of kidney: Secondary | ICD-10-CM

## 2019-02-25 DIAGNOSIS — R350 Frequency of micturition: Secondary | ICD-10-CM | POA: Diagnosis not present

## 2019-02-25 LAB — MICROSCOPIC EXAMINATION

## 2019-02-25 LAB — URINALYSIS, COMPLETE
Bilirubin, UA: NEGATIVE
Glucose, UA: NEGATIVE
Ketones, UA: NEGATIVE
Nitrite, UA: NEGATIVE
Protein,UA: NEGATIVE
Specific Gravity, UA: 1.01 (ref 1.005–1.030)
Urobilinogen, Ur: 0.2 mg/dL (ref 0.2–1.0)
pH, UA: 5.5 (ref 5.0–7.5)

## 2019-02-25 LAB — BLADDER SCAN AMB NON-IMAGING: Scan Result: 108

## 2019-02-25 MED ORDER — SULFAMETHOXAZOLE-TRIMETHOPRIM 800-160 MG PO TABS: 1.0000 | ORAL_TABLET | Freq: Two times a day (BID) | ORAL | 0 refills | Status: DC

## 2019-02-28 LAB — CULTURE, URINE COMPREHENSIVE

## 2019-03-02 ENCOUNTER — Telehealth: Payer: Self-pay

## 2019-03-02 NOTE — Telephone Encounter (Signed)
mychart sent.

## 2019-03-02 NOTE — Telephone Encounter (Signed)
-----   Message from Nori Riis, PA-C sent at 03/02/2019  6:14 AM EDT ----- Please let [name] know that her urine culture was positive and the Septra is the correct antibiotic.  I would like to check an UA next week to make sure no microscopic blood is seen.

## 2019-03-03 NOTE — Telephone Encounter (Signed)
Called patient no answer could not leave vmail

## 2019-03-04 IMAGING — CT CT ABD-PEL WO/W CM
3 of 12 series · 11 of 46 positions shown, 17 images · IV contrast (iopamidol)
Comparison: None.

CLINICAL DATA: UTI [REDACTED] with gross hematuria treated with one
round of antibiotics. No hx kidney stones. TKV^100mL S7SORM-CLL
IOPAMIDOL (S7SORM-CLL) INJECTION 61%

EXAM:
CT ABDOMEN AND PELVIS WITHOUT AND WITH CONTRAST
TECHNIQUE: Multidetector CT imaging of the abdomen and pelvis was performed
following the standard protocol before and following the bolus
administration of intravenous contrast.
CONTRAST:  100mL S7SORM-CLL IOPAMIDOL (S7SORM-CLL) INJECTION 61%

[Series 2: without pre · axial · non-contrast · 0.64mm/px · z∈[-1528,-1188]mm · 7 of 92 slices shown, 12 images]
[im 12/92  soft-tissue]
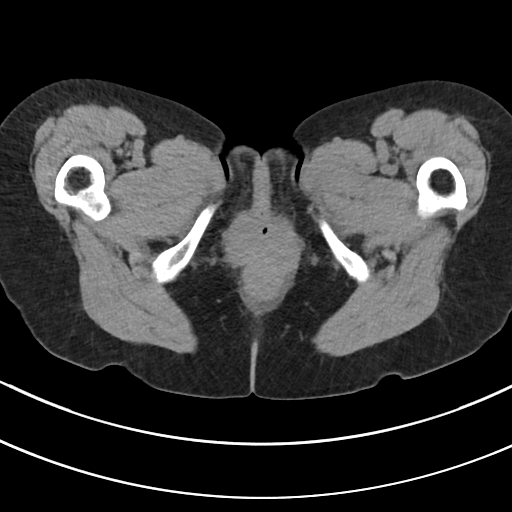
[im 12/92  bone]
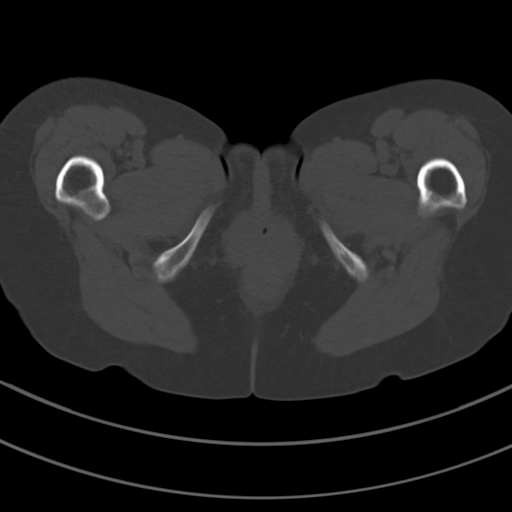
[im 23/92  soft-tissue]
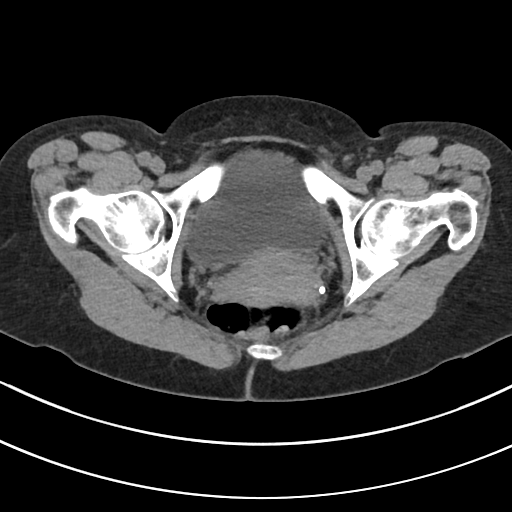
[im 35/92  soft-tissue]
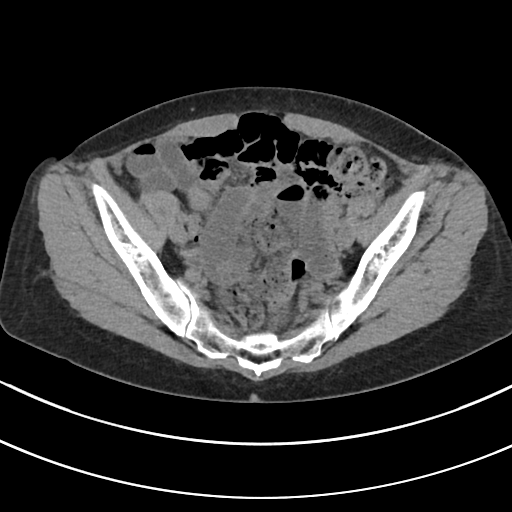
[im 46/92  soft-tissue]
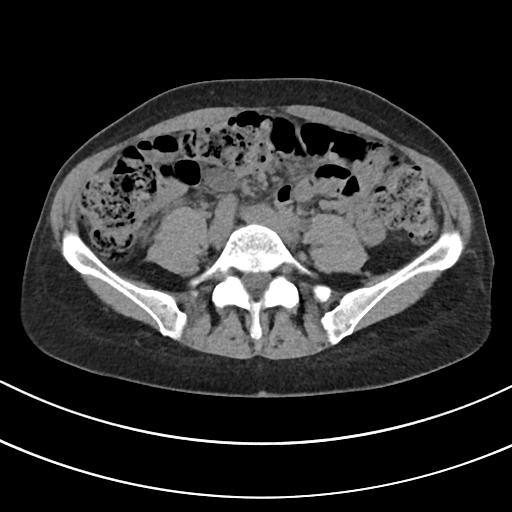
[im 46/92  lung]
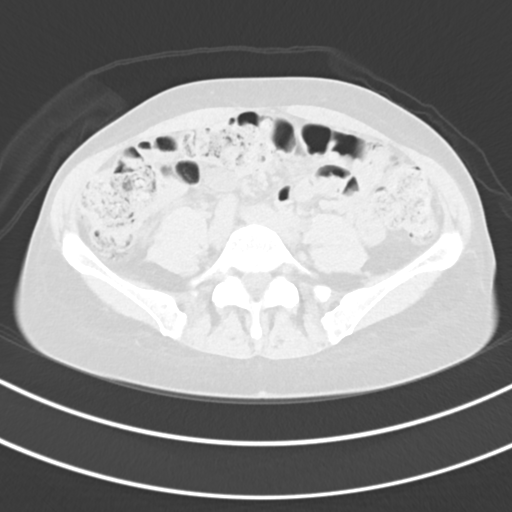
[im 57/92  soft-tissue]
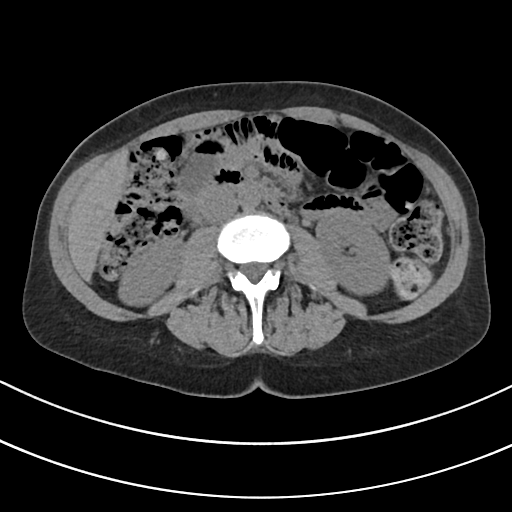
[im 57/92  lung]
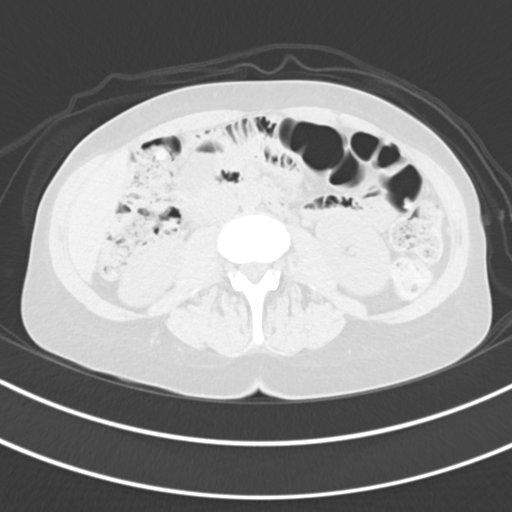
[im 69/92  soft-tissue]
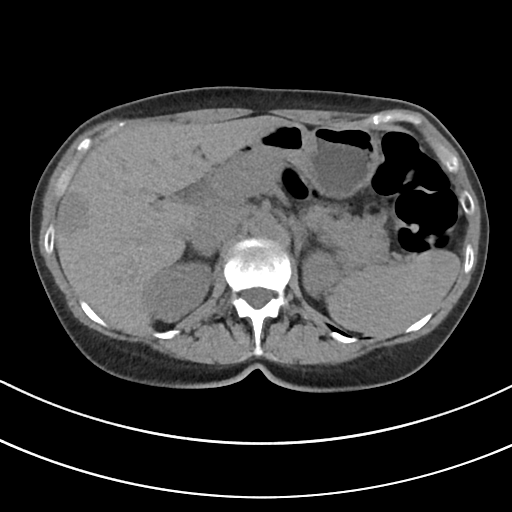
[im 69/92  lung]
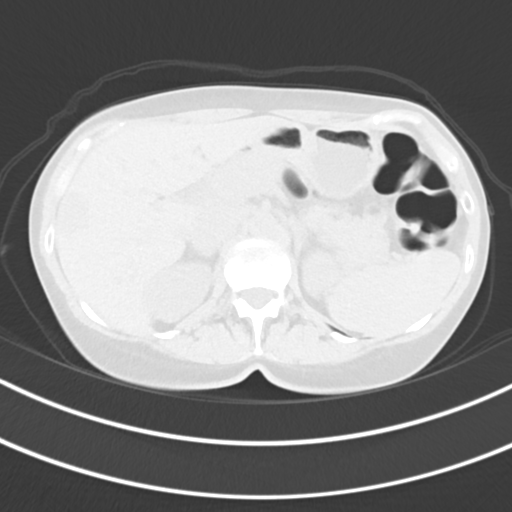
[im 80/92  soft-tissue]
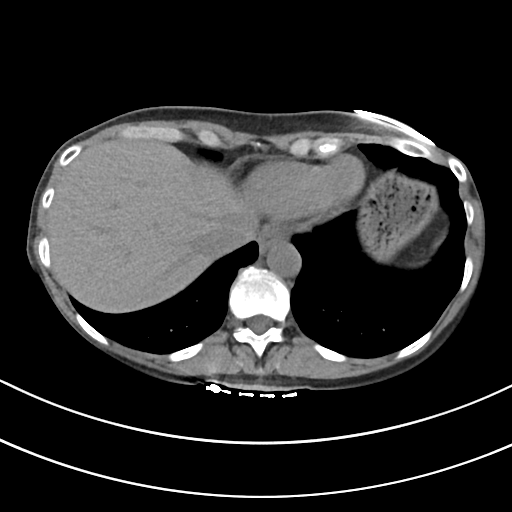
[im 80/92  lung]
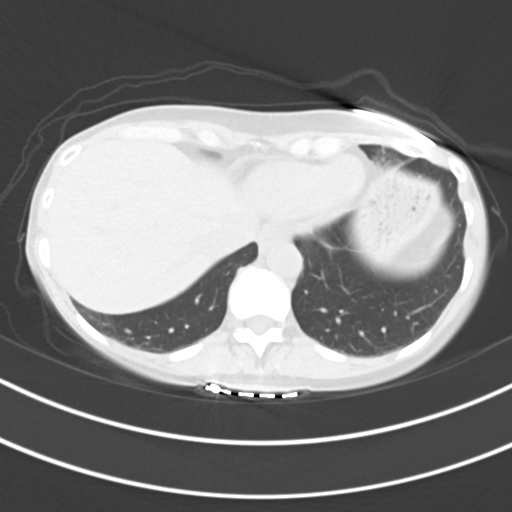

[Series 5: cor without without pre · coronal · non-contrast · 0.64mm/px · 2 of 109 slices shown, 3 images]
[im 37/109  soft-tissue]
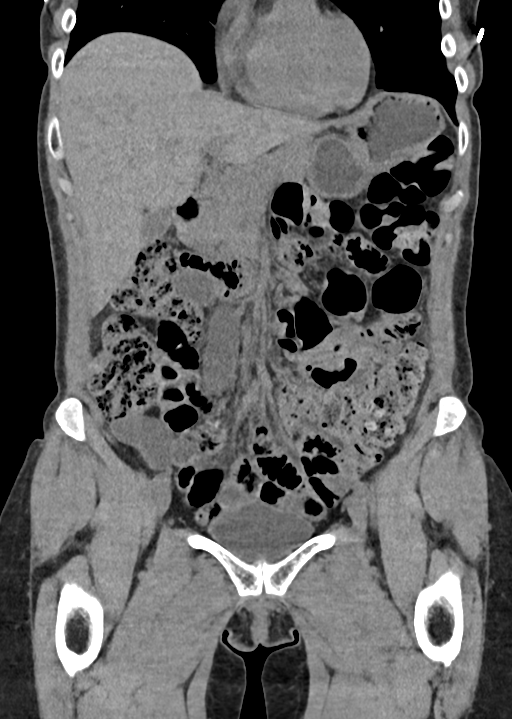
[im 37/109  bone]
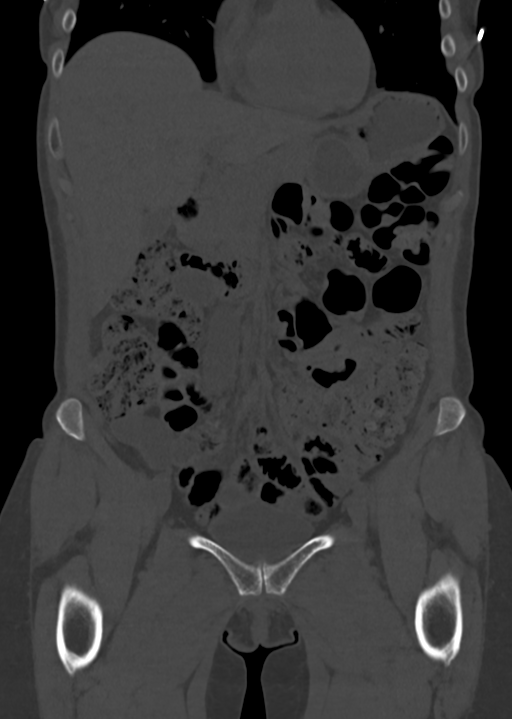
[im 73/109  soft-tissue]
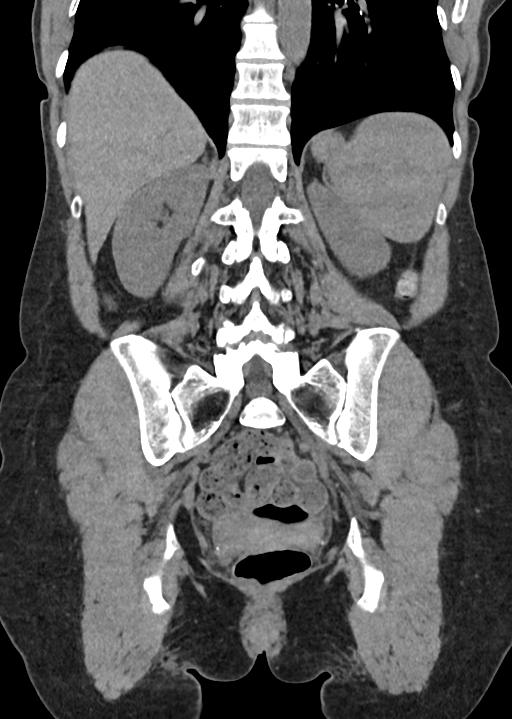

[Series 9: axial with hematuria with · axial · 0.64mm/px · z∈[-1493,-1433]mm · 2 of 86 slices shown]
[im 13/86  soft-tissue]
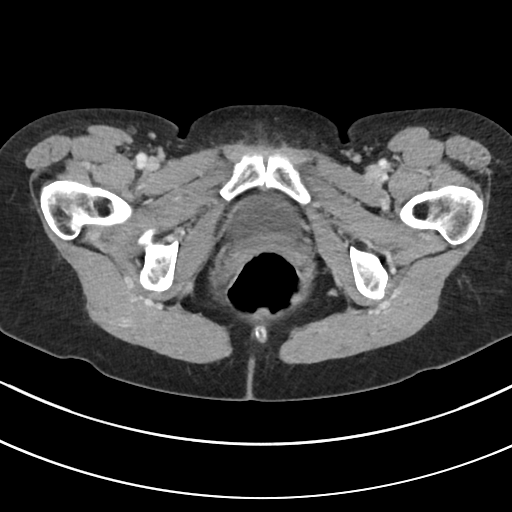
[im 25/86  soft-tissue]
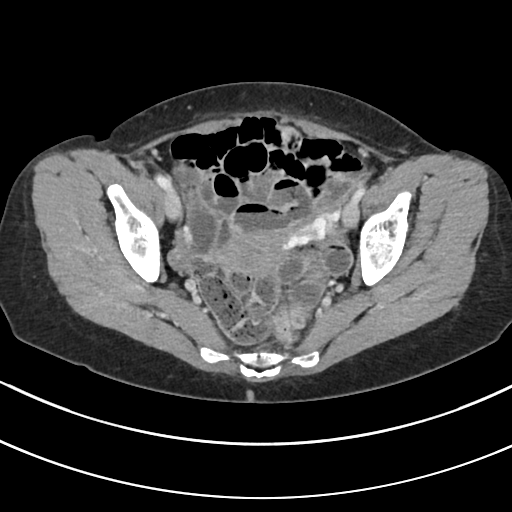

[11 of 46 positions shown; findings below may reference images not displayed]

FINDINGS: Lower chest: Lung bases are clear.

Hepatobiliary: 2 lesion RIGHT hepatic lobe have peripheral
enhancement typical of hemangioma (image [DATE] and image [DATE]). No
intrahepatic duct dilatation. Gallbladder normal.

Pancreas: Pancreas is normal. No ductal dilatation. No pancreatic
inflammation.

Spleen: Normal spleen

Adrenals/urinary tract: Adrenal glands normal. Nonobstructing
calculus in the mid RIGHT kidney measures 3 mm. No ureterolithiasis
or obstructive uropathy. No enhancing renal lesion. Delayed imaging
demonstrates no filling defects within collecting systems or
ureters.

No bladder stones or filling defects in the bladder which does not
excluded a bladder lesion.

Stomach/Bowel: Stomach, small bowel, appendix, and cecum are normal.
The colon and rectosigmoid colon are normal.

Vascular/Lymphatic: Abdominal aorta is normal caliber. No periportal
or retroperitoneal adenopathy. No pelvic adenopathy.

Reproductive: Uterus and ovaries normal

Other: No free fluid.

Musculoskeletal: No aggressive osseous lesion.
IMPRESSION: 1. Single nonobstructing RIGHT renal calculus..
2. No ureterolithiasis, enhancing renal cortical lesion, or filling
defects within the collecting systems..
3. No bladder stones or filling defects in the bladder which does
not excluded a bladder lesion..
4. Benign hemangioma within the liver.

## 2019-03-04 NOTE — Telephone Encounter (Signed)
Patient notified

## 2019-03-09 ENCOUNTER — Other Ambulatory Visit: Payer: Self-pay | Admitting: Family Medicine

## 2019-03-09 DIAGNOSIS — R310 Gross hematuria: Secondary | ICD-10-CM

## 2019-03-10 ENCOUNTER — Other Ambulatory Visit: Payer: Self-pay

## 2019-03-10 ENCOUNTER — Other Ambulatory Visit: Payer: Managed Care, Other (non HMO)

## 2019-03-10 DIAGNOSIS — R310 Gross hematuria: Secondary | ICD-10-CM

## 2019-03-10 LAB — MICROSCOPIC EXAMINATION: RBC: NONE SEEN /hpf (ref 0–2)

## 2019-03-10 LAB — URINALYSIS, COMPLETE
Bilirubin, UA: NEGATIVE
Glucose, UA: NEGATIVE
Ketones, UA: NEGATIVE
Leukocytes,UA: NEGATIVE
Nitrite, UA: NEGATIVE
Protein,UA: NEGATIVE
RBC, UA: NEGATIVE
Specific Gravity, UA: 1.01 (ref 1.005–1.030)
Urobilinogen, Ur: 0.2 mg/dL (ref 0.2–1.0)
pH, UA: 6 (ref 5.0–7.5)

## 2019-03-16 ENCOUNTER — Telehealth: Payer: Self-pay | Admitting: Urology

## 2019-03-16 NOTE — Telephone Encounter (Signed)
Please schedule Debbie Sanchez for a one year follow up for hematuria.

## 2019-06-11 ENCOUNTER — Other Ambulatory Visit: Payer: Self-pay

## 2019-06-15 ENCOUNTER — Ambulatory Visit (INDEPENDENT_AMBULATORY_CARE_PROVIDER_SITE_OTHER): Payer: Managed Care, Other (non HMO) | Admitting: Internal Medicine

## 2019-06-15 ENCOUNTER — Encounter: Payer: Self-pay | Admitting: Internal Medicine

## 2019-06-15 ENCOUNTER — Other Ambulatory Visit: Payer: Self-pay

## 2019-06-15 VITALS — BP 126/64 | HR 94 | Temp 98.4°F | Resp 14 | Ht 59.5 in | Wt 103.2 lb

## 2019-06-15 DIAGNOSIS — R5383 Other fatigue: Secondary | ICD-10-CM | POA: Diagnosis not present

## 2019-06-15 DIAGNOSIS — Z23 Encounter for immunization: Secondary | ICD-10-CM

## 2019-06-15 DIAGNOSIS — Z0000 Encounter for general adult medical examination without abnormal findings: Secondary | ICD-10-CM

## 2019-06-15 DIAGNOSIS — I471 Supraventricular tachycardia: Secondary | ICD-10-CM | POA: Diagnosis not present

## 2019-06-15 DIAGNOSIS — F4321 Adjustment disorder with depressed mood: Secondary | ICD-10-CM | POA: Insufficient documentation

## 2019-06-15 DIAGNOSIS — G250 Essential tremor: Secondary | ICD-10-CM

## 2019-06-15 DIAGNOSIS — R319 Hematuria, unspecified: Secondary | ICD-10-CM

## 2019-06-15 NOTE — Assessment & Plan Note (Signed)

## 2019-06-15 NOTE — Assessment & Plan Note (Signed)
Persistent , despite repeated workup by Urology

## 2019-06-15 NOTE — Progress Notes (Signed)
Patient ID: Debbie Sanchez, female    DOB: 1956-08-10  Age: 63 y.o. MRN: [id number]  The patient is here for annual PREVENTIVE examination and management of other chronic and acute problems.   The risk factors are reflected in the social history.  The roster of all physicians providing medical care to patient - is listed in the Snapshot section of the chart.  Activities of daily living:  The patient is 100% independent in all ADLs: dressing, toileting, feeding as well as independent mobility  Home safety : The patient has smoke detectors in the home. They wear seatbelts.  There are no firearms at home. There is no violence in the home.   There is no risks for hepatitis, STDs or HIV. There is no   history of blood transfusion. They have no travel history to infectious disease endemic areas of the world.  The patient has seen their dentist in the last six month. They have seen their eye doctor in the last year. They admit to slight hearing difficulty with regard to whispered voices and some television programs.  They have deferred audiologic testing in the last year.  They do not  have excessive sun exposure. Discussed the need for sun protection: hats, long sleeves and use of sunscreen if there is significant sun exposure.   Diet: the importance of a healthy diet is discussed. They do have a healthy diet.  The benefits of regular aerobic exercise were discussed. She walks 4 times per week ,  20 minutes.   Depression screen: there are no signs or vegative symptoms of depression- irritability, change in appetite, anhedonia, sadness/tearfullness.   The following portions of the patient's history were reviewed and updated as appropriate: allergies, current medications, past family history, past medical history,  past surgical history, past social history  and problem list.  Visual acuity was not assessed per patient preference since she has regular follow up with her ophthalmologist. Hearing and  body mass index were assessed and reviewed.   During the course of the visit the patient was educated and counseled about appropriate screening and preventive services including : fall prevention , diabetes screening, nutrition counseling, colorectal cancer screening, and recommended immunizations.    CC: The primary encounter diagnosis was Fatigue, unspecified type. Diagnoses of Need for immunization against influenza, Paroxysmal supraventricular tachycardia ([place]), Encounter for preventive health examination, Grief, Hematuria, undiagnosed cause, and Benign essential tremor were also pertinent to this visit.  [name] has a past medical history of Fibrocystic breast disease and Menopause.   She has a past surgical history that includes Tubal ligation; Cesarean section (1998); Mouth surgery; Breast cyst aspiration (Right, 2002); and Breast biopsy (Right, 2010).   Her family history includes Breast cancer in her maternal aunt; Cancer in her maternal aunt; Cancer (age of onset: 54) in her mother.She reports that she has never smoked. She has never used smokeless tobacco. She reports current alcohol use. She reports that she does not use drugs.  Outpatient Medications Prior to Visit  Medication Sig Dispense Refill  . Ascorbic Acid (VITAMIN C) 100 MG tablet Take 1,000 mg by mouth 2 (two) times daily.     Marland Kitchen aspirin 81 MG EC tablet Take 81 mg by mouth daily. Swallow whole.    . cholecalciferol (VITAMIN D3) 25 MCG (1000 UT) tablet Take 1,000 Units by mouth daily.    . Cyanocobalamin (VITAMIN B 12) 500 MCG TABS Take by mouth.    . MELATONIN ER PO Take by mouth.    Marland Kitchen  Multiple Vitamin (MULTIVITAMIN) tablet Take 1 tablet by mouth daily.    . vitamin E 600 UNIT capsule Take 600 Units by mouth daily.    Marland Kitchen zinc gluconate 50 MG tablet Take 50 mg by mouth daily.    Marland Kitchen sulfamethoxazole-trimethoprim (BACTRIM DS) 800-160 MG tablet Take 1 tablet by mouth every 12 (twelve) hours. (Patient not taking: Reported  on 06/15/2019) 14 tablet 0   No facility-administered medications prior to visit.     Review of Systems   Patient denies headache, fevers, malaise, unintentional weight loss, skin rash, eye pain, sinus congestion and sinus pain, sore throat, dysphagia,  hemoptysis , cough, dyspnea, wheezing, chest pain, palpitations, orthopnea, edema, abdominal pain, nausea, melena, diarrhea, constipation, flank pain, dysuria, hematuria, urinary  Frequency, nocturia, numbness, tingling, seizures,  Focal weakness, Loss of consciousness,  Tremor, insomnia, depression, anxiety, and suicidal ideation.     Objective:  BP 126/64 (BP Location: Left Arm, Patient Position: Sitting, Cuff Size: Normal)   Pulse 94   Temp 98.4 F (36.9 C) (Temporal)   Resp 14   Ht 4' 11.5" (1.511 m)   Wt 103 lb 3.2 oz (46.8 kg)   SpO2 99%   BMI 20.50 kg/m   Physical Exam   General appearance: alert, cooperative and appears stated age Head: Normocephalic, without obvious abnormality, atraumatic Eyes: conjunctivae/corneas clear. PERRL, EOM's intact. Fundi benign. Ears: normal TM's and external ear canals both ears Nose: Nares normal. Septum midline. Mucosa normal. No drainage or sinus tenderness. Throat: lips, mucosa, and tongue normal; teeth and gums normal Neck: no adenopathy, no carotid bruit, no JVD, supple, symmetrical, trachea midline and thyroid not enlarged, symmetric, no tenderness/mass/nodules Lungs: clear to auscultation bilaterally Breasts: normal appearance, no masses or tenderness Heart: regular rate and rhythm, S1, S2 normal, no murmur, click, rub or gallop Abdomen: soft, non-tender; bowel sounds normal; no masses,  no organomegaly Extremities: extremities normal, atraumatic, no cyanosis or edema Pulses: 2+ and symmetric Skin: Skin color, texture, turgor normal. No rashes or lesions Neurologic: Alert and oriented X 3, normal strength and tone. Normal symmetric reflexes. Normal coordination and gait.       Assessment & Plan:   Problem List Items Addressed This Visit      Unprioritized   Encounter for preventive health examination    age appropriate education and counseling updated, referrals for preventative services and immunizations addressed, dietary and smoking counseling addressed, most recent labs reviewed.  I have personally reviewed and have noted:  1) the patient's medical and social history 2) The pt's use of alcohol, tobacco, and illicit drugs 3) The patient's current medications and supplements 4) Functional ability including ADL's, fall risk, home safety risk, hearing and visual impairment 5) Diet and physical activities 6) Evidence for depression or mood disorder 7) The patient's height, weight, and BMI have been recorded in the chart  I have made referrals, and provided counseling and education based on review of the above      Benign essential tremor    Has not progressed by today's exam.      Hematuria, undiagnosed cause    Persistent , despite repeated workup by Urology      Paroxysmal supraventricular tachycardia ([place])   Grief    Over loss of nephew and sister in law in one week Patient is dealing with the unexpected loss and has  adequate coping skills and emotional support .  i have asked patinet to return in one month to examine for signs of unresolving grief.  Other Visit Diagnoses    Fatigue, unspecified type    -  Primary   Relevant Orders   Vitamin B12   CBC with Differential/Platelet   Comprehensive metabolic panel   TSH   Need for immunization against influenza       Relevant Orders   Flu Vaccine QUAD 36+ mos IM (Completed)      I have discontinued Jana Half E. Luffman's sulfamethoxazole-trimethoprim. I am also having her maintain her multivitamin, vitamin C, MELATONIN ER PO, aspirin, cholecalciferol, Vitamin B 12, zinc gluconate, and vitamin E.  No orders of the defined types were placed in this encounter.   Medications  Discontinued During This Encounter  Medication Reason  . sulfamethoxazole-trimethoprim (BACTRIM DS) 800-160 MG tablet Completed Course    Follow-up: No follow-ups on file.   Crecencio Mc, MD

## 2019-06-15 NOTE — Assessment & Plan Note (Signed)
Over loss of nephew and sister in law in one week Patient is dealing with the unexpected loss and has  adequate coping skills and emotional support .  i have asked patinet to return in one month to examine for signs of unresolving grief.

## 2019-06-15 NOTE — Patient Instructions (Addendum)

## 2019-06-15 NOTE — Assessment & Plan Note (Signed)
Has not progressed by today's exam.

## 2019-06-16 LAB — CBC WITH DIFFERENTIAL/PLATELET
Basophils Absolute: 0 10*3/uL (ref 0.0–0.1)
Basophils Relative: 0.4 % (ref 0.0–3.0)
Eosinophils Absolute: 0.1 10*3/uL (ref 0.0–0.7)
Eosinophils Relative: 0.9 % (ref 0.0–5.0)
HCT: 41.9 % (ref 36.0–46.0)
Hemoglobin: 13.9 g/dL (ref 12.0–15.0)
Lymphocytes Relative: 23.4 % (ref 12.0–46.0)
Lymphs Abs: 1.9 10*3/uL (ref 0.7–4.0)
MCHC: 33.2 g/dL (ref 30.0–36.0)
MCV: 91.5 fl (ref 78.0–100.0)
Monocytes Absolute: 0.8 10*3/uL (ref 0.1–1.0)
Monocytes Relative: 9.3 % (ref 3.0–12.0)
Neutro Abs: 5.3 10*3/uL (ref 1.4–7.7)
Neutrophils Relative %: 66 % (ref 43.0–77.0)
Platelets: 237 10*3/uL (ref 150.0–400.0)
RBC: 4.58 Mil/uL (ref 3.87–5.11)
RDW: 12.6 % (ref 11.5–15.5)
WBC: 8.1 10*3/uL (ref 4.0–10.5)

## 2019-06-16 LAB — COMPREHENSIVE METABOLIC PANEL
ALT: 15 U/L (ref 0–35)
AST: 17 U/L (ref 0–37)
Albumin: 4.2 g/dL (ref 3.5–5.2)
Alkaline Phosphatase: 87 U/L (ref 39–117)
BUN: 10 mg/dL (ref 6–23)
CO2: 31 mEq/L (ref 19–32)
Calcium: 9.5 mg/dL (ref 8.4–10.5)
Chloride: 102 mEq/L (ref 96–112)
Creatinine, Ser: 0.53 mg/dL (ref 0.40–1.20)
GFR: 116.47 mL/min (ref >60.00)
Glucose, Bld: 100 mg/dL — ABNORMAL HIGH (ref 70–99)
Potassium: 3.8 mEq/L (ref 3.5–5.1)
Sodium: 141 mEq/L (ref 135–145)
Total Bilirubin: 0.5 mg/dL (ref 0.2–1.2)
Total Protein: 6.8 g/dL (ref 6.0–8.3)

## 2019-06-17 NOTE — Addendum Note (Signed)
Addended by: Leeanne Rio on: 06/17/2019 04:43 PM   Modules accepted: Orders

## 2019-06-19 ENCOUNTER — Other Ambulatory Visit (INDEPENDENT_AMBULATORY_CARE_PROVIDER_SITE_OTHER): Payer: Managed Care, Other (non HMO)

## 2019-06-19 ENCOUNTER — Other Ambulatory Visit: Payer: Self-pay

## 2019-06-19 DIAGNOSIS — R5383 Other fatigue: Secondary | ICD-10-CM | POA: Diagnosis not present

## 2019-06-20 LAB — TSH: TSH: 1.24 mIU/L (ref 0.40–4.50)

## 2019-06-20 LAB — VITAMIN B12: Vitamin B-12: 1807 pg/mL — ABNORMAL HIGH (ref 200–1100)

## 2019-07-01 ENCOUNTER — Other Ambulatory Visit: Payer: Self-pay

## 2019-07-01 ENCOUNTER — Emergency Department
Admission: EM | Admit: 2019-07-01 | Discharge: 2019-07-01 | Disposition: A | Discharge status: Home / Self Care | Payer: Managed Care, Other (non HMO) | Attending: Emergency Medicine | Admitting: Emergency Medicine

## 2019-07-01 ENCOUNTER — Encounter: Payer: Self-pay | Admitting: Emergency Medicine

## 2019-07-01 DIAGNOSIS — Z5321 Procedure and treatment not carried out due to patient leaving prior to being seen by health care provider: Secondary | ICD-10-CM | POA: Diagnosis not present

## 2019-07-01 DIAGNOSIS — R1110 Vomiting, unspecified: Secondary | ICD-10-CM | POA: Diagnosis present

## 2019-07-01 LAB — CBC
HCT: 43.7 % (ref 36.0–46.0)
Hemoglobin: 14.7 g/dL (ref 12.0–15.0)
MCH: 30.4 pg (ref 26.0–34.0)
MCHC: 33.6 g/dL (ref 30.0–36.0)
MCV: 90.3 fL (ref 80.0–100.0)
Platelets: 329 10*3/uL (ref 150–400)
RBC: 4.84 MIL/uL (ref 3.87–5.11)
RDW: 11.9 % (ref 11.5–15.5)
WBC: 14 10*3/uL — ABNORMAL HIGH (ref 4.0–10.5)
nRBC: 0 % (ref 0.0–0.2)

## 2019-07-01 LAB — BASIC METABOLIC PANEL
Anion gap: 11 (ref 5–15)
BUN: 15 mg/dL (ref 8–23)
CO2: 27 mmol/L (ref 22–32)
Calcium: 9.5 mg/dL (ref 8.9–10.3)
Chloride: 101 mmol/L (ref 98–111)
Creatinine, Ser: 0.65 mg/dL (ref 0.44–1.00)
GFR calc Af Amer: >60 mL/min (ref >60)
GFR calc non Af Amer: >60 mL/min (ref >60)
Glucose, Bld: 190 mg/dL — ABNORMAL HIGH (ref 70–99)
Potassium: 4 mmol/L (ref 3.5–5.1)
Sodium: 139 mmol/L (ref 135–145)

## 2019-07-01 NOTE — ED Triage Notes (Signed)
PT c/o nausea and syncopal episode today. Pt appears fatigue

## 2019-07-01 NOTE — ED Notes (Signed)
Pt had zofran odt prior to arrival

## 2019-07-02 NOTE — Telephone Encounter (Signed)
Patient says she does have a HX of syncope when she does not eat regularly, but yesterday it happened when she mashed her finger patient gives great HX in message. I have scheduled her a visit for 07/03/19 virtual if you would like her to come in for labs I could have her come this afternoon. She had CBC and BMET at the ER yesterday WBC was 14 and CBG 190 and she had not eaten since breakfast and this occurred after lunch.

## 2019-07-03 ENCOUNTER — Encounter: Payer: Self-pay | Admitting: Internal Medicine

## 2019-07-03 ENCOUNTER — Ambulatory Visit (INDEPENDENT_AMBULATORY_CARE_PROVIDER_SITE_OTHER): Payer: Managed Care, Other (non HMO) | Admitting: Internal Medicine

## 2019-07-03 ENCOUNTER — Other Ambulatory Visit: Payer: Self-pay

## 2019-07-03 VITALS — Ht 59.49 in | Wt 101.6 lb

## 2019-07-03 DIAGNOSIS — D72829 Elevated white blood cell count, unspecified: Secondary | ICD-10-CM | POA: Diagnosis not present

## 2019-07-03 DIAGNOSIS — R7309 Other abnormal glucose: Secondary | ICD-10-CM

## 2019-07-03 DIAGNOSIS — R739 Hyperglycemia, unspecified: Secondary | ICD-10-CM

## 2019-07-03 DIAGNOSIS — R55 Syncope and collapse: Secondary | ICD-10-CM | POA: Diagnosis not present

## 2019-07-03 NOTE — Progress Notes (Signed)
Patient said that she vomited and fainted on Wednesday.  No other episodes since Wednesday.

## 2019-07-03 NOTE — Progress Notes (Signed)
Virtual Visit converted to telephone  Note  This visit type was conducted due to national recommendations for restrictions regarding the COVID-19 pandemic (e.g. social distancing).  This format is felt to be most appropriate for this patient at this time.  All issues noted in this document were discussed and addressed.  No physical exam was performed (except for noted visual exam findings with Video Visits).   I attempted to connect  with@ on 07/03/19 at  9:30 AM EST by a video enabled telemedicine application. Interactive audio and video telecommunications however ultimately failed, due to patient having technical difficulties. We continued and completed visit with audio only  and verified that I am speaking with the correct person using two identifiers or telephone and verified that I am speaking with the correct person using two identifiers. Location patient: home Location provider: work or home office Persons participating in the virtual visit: patient, provider  I discussed the limitations, risks, security and privacy concerns of performing an evaluation and management service by telephone and the availability of in person appointments. I also discussed with the patient that there may be a patient responsible charge related to this service. The patient expressed understanding and agreed to proceed.  Reason for visit: ER follow up , syncopal episode HPI:  63 yr old female with no significant PMH presents after an ER evaluation on Nov 11 for syncopal episode.  Patient reports that on Nov 11 she was working outside and smashed her finger with a hammer. The episode occurred while she was in a fasting state.  She became immediately nauseated and then lost consciousness for < 1 minute.  She was evaluated in the ER with an EKG and labs and sent home  ER records reviewed.  EKG is normal .  Labs reviewed:  Hyperglycemia and leukocytosis noted . ROS: Patient denies headache, fevers, malaise,  unintentional weight loss, skin rash, eye pain, sinus congestion and sinus pain, sore throat, dysphagia,  hemoptysis , cough, dyspnea, wheezing, chest pain, palpitations, orthopnea, edema, abdominal pain, recurrence of nausea, melena, diarrhea, constipation, flank pain, dysuria, hematuria, urinary  Frequency, nocturia, numbness, tingling, seizures,  Focal weakness,  Tremor, insomnia, depression, anxiety, and suicidal ideation.      Past Medical History:  Diagnosis Date  . Fibrocystic breast disease    rt breast aspiration 2002  . Menopause    last menses 2008    Past Surgical History:  Procedure Laterality Date  . BREAST BIOPSY Right 2010   cyst aspiration/US guided biopsy, clip placement  . BREAST CYST ASPIRATION Right 2002   cyst aspiration  . CESAREAN SECTION  1998  . MOUTH SURGERY    . TUBAL LIGATION      Family History  Problem Relation Age of Onset  . Cancer Mother 69       colon CA / resected completely 1999  . Cancer Maternal Aunt        breast CA  . Breast cancer Maternal Aunt        2 mat great aunts    SOCIAL HX:  reports that she has never smoked. She has never used smokeless tobacco. She reports current alcohol use. She reports that she does not use drugs.   Current Outpatient Medications:  .  Ascorbic Acid (VITAMIN C) 100 MG tablet, Take 1,000 mg by mouth 2 (two) times daily. , Disp: , Rfl:  .  aspirin 81 MG EC tablet, Take 81 mg by mouth as needed. Swallow whole. , Disp: , Rfl:  .  cholecalciferol (VITAMIN D3) 25 MCG (1000 UT) tablet, Take 1,000 Units by mouth daily., Disp: , Rfl:  .  Cyanocobalamin (VITAMIN B 12) 500 MCG TABS, Take by mouth., Disp: , Rfl:  .  MELATONIN ER PO, Take by mouth., Disp: , Rfl:  .  Multiple Vitamin (MULTIVITAMIN) tablet, Take 1 tablet by mouth daily., Disp: , Rfl:  .  TURMERIC PO, Take by mouth., Disp: , Rfl:  .  vitamin E 600 UNIT capsule, Take 600 Units by mouth daily., Disp: , Rfl:  .  zinc gluconate 50 MG tablet, Take 50 mg  by mouth daily., Disp: , Rfl:   EXAM:  VITALS per patient if applicable:  GENERAL: alert, oriented, appears well and in no acute distress  HEENT: atraumatic, conjunttiva clear, no obvious abnormalities on inspection of external nose and ears  NECK: normal movements of the head and neck  LUNGS: on inspection no signs of respiratory distress, breathing rate appears normal, no obvious gross SOB, gasping or wheezing  CV: no obvious cyanosis  MS: moves all visible extremities without noticeable abnormality  PSYCH/NEURO: pleasant and cooperative, no obvious depression or anxiety, speech and thought processing grossly intact  ASSESSMENT AND PLAN:  Discussed the following assessment and plan:  Elevated random blood glucose level - Plan: Hemoglobin A1c, Comprehensive metabolic panel  Leukocytosis, unspecified type - Plan: CBC with Differential/Platelet  Syncope and collapse  Syncope and collapse Events surrounding the episode reviewed as well as ER visit and prior historical episodes .  No workup at this time since episode was provoked by trauma.  Will repeat labs .  Will send to cardiology if she has another occurrence in a short period of time,  Or an episode not provoked by physical pain /trauma    I discussed the assessment and treatment plan with the patient. The patient was provided an opportunity to ask questions and all were answered. The patient agreed with the plan and demonstrated an understanding of the instructions.   The patient was advised to call back or seek an in-person evaluation if the symptoms worsen or if the condition fails to improve as anticipated.    I provided  25 minutes of non-face-to-face time during this encounter reviewing patient's current problems and post surgeries.  Providing counseling on the above mentioned problems , and coordination  of care . Crecencio Mc, MD

## 2019-07-03 NOTE — Assessment & Plan Note (Signed)
Events surrounding the episode reviewed as well as ER visit and prior historical episodes .  No workup at this time since episode was provoked by trauma.  Will repeat labs .  Will send to cardiology if she has another occurrence in a short period of time,  Or an episode not provoked by physical pain Debbie Sanchez

## 2019-07-03 NOTE — Addendum Note (Signed)
Addended by: Leeanne Rio on: 07/03/2019 04:18 PM   Modules accepted: Orders

## 2019-07-03 NOTE — Addendum Note (Signed)
Addended by: Leeanne Rio on: 07/03/2019 04:20 PM   Modules accepted: Orders

## 2019-07-04 LAB — CBC WITH DIFFERENTIAL/PLATELET
Basophils Absolute: 0 10*3/uL (ref 0.0–0.2)
Basos: 0 %
EOS (ABSOLUTE): 0.1 10*3/uL (ref 0.0–0.4)
Eos: 1 %
Hematocrit: 42 % (ref 34.0–46.6)
Hemoglobin: 14.1 g/dL (ref 11.1–15.9)
Immature Grans (Abs): 0 10*3/uL (ref 0.0–0.1)
Immature Granulocytes: 0 %
Lymphocytes Absolute: 2.3 10*3/uL (ref 0.7–3.1)
Lymphs: 35 %
MCH: 30.6 pg (ref 26.6–33.0)
MCHC: 33.6 g/dL (ref 31.5–35.7)
MCV: 91 fL (ref 79–97)
Monocytes Absolute: 0.6 10*3/uL (ref 0.1–0.9)
Monocytes: 9 %
Neutrophils Absolute: 3.6 10*3/uL (ref 1.4–7.0)
Neutrophils: 55 %
Platelets: 279 10*3/uL (ref 150–450)
RBC: 4.61 x10E6/uL (ref 3.77–5.28)
RDW: 11.6 % — ABNORMAL LOW (ref 11.7–15.4)
WBC: 6.5 10*3/uL (ref 3.4–10.8)

## 2019-07-04 LAB — COMPREHENSIVE METABOLIC PANEL
ALT: 29 IU/L (ref 0–32)
AST: 24 IU/L (ref 0–40)
Albumin/Globulin Ratio: 2 (ref 1.2–2.2)
Albumin: 4.5 g/dL (ref 3.8–4.8)
Alkaline Phosphatase: 96 IU/L (ref 39–117)
BUN/Creatinine Ratio: 16 (ref 12–28)
BUN: 10 mg/dL (ref 8–27)
Bilirubin Total: 0.3 mg/dL (ref 0.0–1.2)
CO2: 26 mmol/L (ref 20–29)
Calcium: 9.7 mg/dL (ref 8.7–10.3)
Chloride: 101 mmol/L (ref 96–106)
Creatinine, Ser: 0.61 mg/dL (ref 0.57–1.00)
GFR calc Af Amer: 112 mL/min/{1.73_m2} (ref >59)
GFR calc non Af Amer: 97 mL/min/{1.73_m2} (ref >59)
Globulin, Total: 2.3 g/dL (ref 1.5–4.5)
Glucose: 89 mg/dL (ref 65–99)
Potassium: 4.1 mmol/L (ref 3.5–5.2)
Sodium: 141 mmol/L (ref 134–144)
Total Protein: 6.8 g/dL (ref 6.0–8.5)

## 2019-07-04 LAB — HEMOGLOBIN A1C
Est. average glucose Bld gHb Est-mCnc: 117 mg/dL
Hgb A1c MFr Bld: 5.7 % — ABNORMAL HIGH (ref 4.8–5.6)

## 2019-08-03 ENCOUNTER — Other Ambulatory Visit: Payer: Self-pay | Admitting: Internal Medicine

## 2019-08-03 DIAGNOSIS — Z1231 Encounter for screening mammogram for malignant neoplasm of breast: Secondary | ICD-10-CM

## 2019-08-07 ENCOUNTER — Ambulatory Visit
Admission: RE | Admit: 2019-08-07 | Discharge: 2019-08-07 | Disposition: A | Discharge status: Home / Self Care | Payer: Managed Care, Other (non HMO) | Source: Ambulatory Visit | Attending: Internal Medicine | Admitting: Internal Medicine

## 2019-08-07 DIAGNOSIS — Z1231 Encounter for screening mammogram for malignant neoplasm of breast: Secondary | ICD-10-CM | POA: Insufficient documentation

## 2019-12-30 ENCOUNTER — Ambulatory Visit: Payer: Managed Care, Other (non HMO) | Admitting: Dermatology

## 2019-12-30 ENCOUNTER — Other Ambulatory Visit: Payer: Self-pay

## 2019-12-30 ENCOUNTER — Encounter: Payer: Self-pay | Admitting: Dermatology

## 2019-12-30 DIAGNOSIS — Z1283 Encounter for screening for malignant neoplasm of skin: Secondary | ICD-10-CM

## 2019-12-30 DIAGNOSIS — L814 Other melanin hyperpigmentation: Secondary | ICD-10-CM

## 2019-12-30 DIAGNOSIS — Z86018 Personal history of other benign neoplasm: Secondary | ICD-10-CM

## 2019-12-30 DIAGNOSIS — D229 Melanocytic nevi, unspecified: Secondary | ICD-10-CM | POA: Diagnosis not present

## 2019-12-30 DIAGNOSIS — L719 Rosacea, unspecified: Secondary | ICD-10-CM | POA: Diagnosis not present

## 2019-12-30 DIAGNOSIS — L820 Inflamed seborrheic keratosis: Secondary | ICD-10-CM | POA: Diagnosis not present

## 2019-12-30 DIAGNOSIS — B351 Tinea unguium: Secondary | ICD-10-CM

## 2019-12-30 DIAGNOSIS — L578 Other skin changes due to chronic exposure to nonionizing radiation: Secondary | ICD-10-CM

## 2019-12-30 DIAGNOSIS — D1801 Hemangioma of skin and subcutaneous tissue: Secondary | ICD-10-CM

## 2019-12-30 DIAGNOSIS — L821 Other seborrheic keratosis: Secondary | ICD-10-CM

## 2019-12-30 MED ORDER — TAVABOROLE 5 % EX SOLN: 1.0000 "application " | Freq: Every day | CUTANEOUS | 11 refills | Status: DC

## 2019-12-30 NOTE — Progress Notes (Signed)
   Follow-Up Visit   Subjective  Debbie Sanchez is a 64 y.o. female who presents for the following: Annual Exam (Total body skin exam, hx dysplastic).   The following portions of the chart were reviewed this encounter and updated as appropriate:  Tobacco  Allergies  Meds  Problems  Med Hx  Surg Hx  Fam Hx      Review of Systems:  No other skin or systemic complaints except as noted in HPI or Assessment and Plan.  Objective  Well appearing patient in no apparent distress; mood and affect are within normal limits.  A full examination was performed including scalp, head, eyes, ears, nose, lips, neck, chest, axillae, abdomen, back, buttocks, bilateral upper extremities, bilateral lower extremities, hands, feet, fingers, toes, fingernails, and toenails. All findings within normal limits unless otherwise noted below.  Objective  Upper back right paraspinal, R inframammary: Scars with no evidence of recurrence.   Objective  Left Flank x 1: Erythematous keratotic or waxy stuck-on papule or plaque.   Objective  face: Mid face erythema with telangiectasias   Objective  Left Hallux Toe Nail Plate: Toenail dystrophy   Assessment & Plan    Lentigines - Scattered tan macules - Discussed due to sun exposure - Benign, observe - Call for any changes  Seborrheic Keratoses - Stuck-on, waxy, tan-brown papules and plaques  - Discussed benign etiology and prognosis. - Observe - Call for any changes  Melanocytic Nevi - Tan-brown and/or pink-flesh-colored symmetric macules and papules - Benign appearing on exam today - Observation - Call clinic for new or changing moles - Recommend daily use of broad spectrum spf 30+ sunscreen to sun-exposed areas.   Hemangiomas - Red papules - Discussed benign nature - Observe - Call for any changes  Actinic Damage - diffuse scaly erythematous macules with underlying dyspigmentation - Recommend daily broad spectrum sunscreen SPF 30+ to  sun-exposed areas, reapply every 2 hours as needed.  - Call for new or changing lesions.  Skin cancer screening performed today.  History of dysplastic nevus Upper back right paraspinal, R inframammary  Clear. Observe for recurrence. Call clinic for new or changing lesions.  Recommend regular skin exams, daily broad-spectrum spf 30+ sunscreen use, and photoprotection.     Inflamed seborrheic keratosis Left Flank x 1  Destruction of lesion - Left Flank x 1 Complexity: simple   Destruction method: cryotherapy   Informed consent: discussed and consent obtained   Timeout:  patient name, date of birth, surgical site, and procedure verified Lesion destroyed using liquid nitrogen: Yes   Region frozen until ice ball extended beyond lesion: Yes   Outcome: patient tolerated procedure well with no complications   Post-procedure details: wound care instructions given    Rosacea face  Discussed BBL if best txt option  Skin cancer screening  Tinea unguium Left Hallux Toe Nail Plate  Start Kerydin solution qhs affected toenails  Tavaborole (KERYDIN) 5 % SOLN - Left Hallux Toe Nail Plate  Other Related Medications Tavaborole (KERYDIN) 5 % SOLN  Return in about 1 year (around 12/29/2020) for TBSE.   I, Othelia Pulling, RMA, am acting as scribe for Sarina Ser, MD .  Documentation: I have reviewed the above documentation for accuracy and completeness, and I agree with the above.  Sarina Ser, MD

## 2019-12-30 NOTE — Patient Instructions (Signed)
Cryotherapy Aftercare  . Wash gently with soap and water everyday.   . Apply Vaseline and Band-Aid daily until healed.  

## 2020-01-01 ENCOUNTER — Telehealth: Payer: Self-pay

## 2020-01-01 DIAGNOSIS — B351 Tinea unguium: Secondary | ICD-10-CM

## 2020-01-01 MED ORDER — TAVABOROLE 5 % EX SOLN: CUTANEOUS | 11 refills | Status: DC

## 2020-01-01 NOTE — Telephone Encounter (Signed)
Discussed with patient Debbie Sanchez is not covered by her insurance. Plan to send prescription to [facility] instead for a cheaper cash price. Patient agrees and will contact us if she has not heard from them by this time next week.

## 2020-01-06 ENCOUNTER — Encounter: Payer: Self-pay | Admitting: Dermatology

## 2020-03-16 NOTE — Progress Notes (Signed)
03/17/2020 10:03 PM   Debbie Sanchez Jun 07, 1956 [id number]  Referring provider: Crecencio Mc, MD [address] [place],  [place] [address]  Chief Complaint  Patient presents with  . Hematuria    1year follow up    HPI: Debbie Sanchez is 64 y.o. female with a history of hematuria, frequency and kidney stones who presents today for follow up.     History of hematuria (high risk) Non-smoker.  CTU completed in 10/2017 noted the adrenal glands normal. Nonobstructing calculus in the mid RIGHT kidney measures 3 mm. No ureterolithiasis or obstructive uropathy. No enhancing renal lesion. Delayed imaging demonstrates no filling defects within collecting systems or ureters.  No bladder stones or filling defects in the bladder which does not excluded a bladder lesion.  Cystoscopy by Dr. Erlene Quan in 10/2017 noted urethral caruncle.  No reports of gross hematuria.  No micro heme on today's UA.    Frequency Failed anticholinergics.  Did not take Myrbetriq due to potential side effects.  Discussed Botox, InterStim and PTNS in the past.   Her PVR is 6 mL.  Today, she continues to have intense urinary frequency and urgency which is bothersome to her.  Patient denies any modifying or aggravating factors.  Patient denies any gross hematuria, dysuria or suprapubic/flank pain.  Patient denies any fevers, chills, nausea or vomiting.   Right renal stone 3 mm right renal stone found incidentally on CTU in 10/2017.  No complaint of flank pain at this time.    PMH: Past Medical History:  Diagnosis Date  . Fibrocystic breast disease    rt breast aspiration 2002  . Hx of dysplastic nevus 03/11/2018   Upper back right paraspinal  . Hx of dysplastic nevus 12/19/2016   R inframammary, mild to moderate  . Menopause    last menses 2008    Surgical History: Past Surgical History:  Procedure Laterality Date  . BREAST BIOPSY Right 2010   cyst aspiration/US guided biopsy, clip placement  .  BREAST CYST ASPIRATION Right 2002   cyst aspiration  . CESAREAN SECTION  1998  . MOUTH SURGERY    . TUBAL LIGATION      Home Medications:  Allergies as of 03/17/2020   No Known Allergies     Medication List       Accurate as of March 17, 2020 11:59 PM. If you have any questions, ask your nurse or doctor.        STOP taking these medications   Tavaborole 5 % Soln Commonly known as: Kerydin Stopped by: Zara Council, PA-C     TAKE these medications   aspirin 81 MG EC tablet Take 81 mg by mouth as needed. Swallow whole.   cholecalciferol 25 MCG (1000 UNIT) tablet Commonly known as: VITAMIN D3 Take 1,000 Units by mouth daily.   MELATONIN ER PO Take by mouth.   multivitamin tablet Take 1 tablet by mouth daily.   TURMERIC PO Take by mouth.   Vitamin B 12 500 MCG Tabs Take by mouth.   vitamin C 100 MG tablet Take 1,000 mg by mouth 2 (two) times daily.   vitamin E 600 UNIT capsule Take 600 Units by mouth daily.   zinc gluconate 50 MG tablet Take 50 mg by mouth daily.       Allergies: No Known Allergies  Family History: Family History  Problem Relation Age of Onset  . Cancer Mother 8       colon CA /  resected completely 1999  . Cancer Maternal Aunt        breast CA  . Breast cancer Maternal Aunt        2 mat great aunts    Social History:  reports that she has never smoked. She has never used smokeless tobacco. She reports current alcohol use. She reports that she does not use drugs.  ROS: For pertinent review of systems please refer to history of present illness  Physical Exam: BP (!) 104/64   Pulse 93   Ht 5' (1.524 m)   Wt 100 lb (45.4 kg)   BMI 19.53 kg/m   Constitutional:  Well nourished. Alert and oriented, No acute distress. HEENT: [place] AT, mask in place.  Trachea midline Cardiovascular: No clubbing, cyanosis, or edema. Respiratory: Normal respiratory effort, no increased work of breathing. Neurologic: Grossly intact, no focal  deficits, moving all 4 extremities. Psychiatric: Normal mood and affect.   Laboratory Data: Lab Results  Component Value Date   WBC 6.5 07/03/2019   HGB 14.1 07/03/2019   HCT 42.0 07/03/2019   MCV 91 07/03/2019   PLT 279 07/03/2019    Lab Results  Component Value Date   CREATININE 0.61 07/03/2019    Lab Results  Component Value Date   HGBA1C 5.7 (H) 07/03/2019    Lab Results  Component Value Date   TSH 1.24 06/19/2019       Component Value Date/Time   CHOL 221 (H) 08/28/2018 0819   CHOL 198 06/12/2017 0830   HDL 68.20 08/28/2018 0819   HDL 75 06/12/2017 0830   CHOLHDL 3 08/28/2018 0819   VLDL 10.4 08/28/2018 0819   LDLCALC 143 (H) 08/28/2018 0819   LDLCALC 115 (H) 06/12/2017 0830    Lab Results  Component Value Date   AST 24 07/03/2019   Lab Results  Component Value Date   ALT 29 07/03/2019    Urinalysis Component     Latest Ref Rng & Units 03/17/2020  Specific Gravity, UA     1.005 - 1.030 1.025  pH, UA     5.0 - 7.5 5.5  Color, UA     Yellow Yellow  Appearance Ur     Clear Clear  Leukocytes,UA     Negative Trace (A)  Protein,UA     Negative/Trace Negative  Glucose, UA     Negative Negative  Ketones, UA     Negative Negative  RBC, UA     Negative Negative  Bilirubin, UA     Negative Negative  Urobilinogen, Ur     0.2 - 1.0 mg/dL 0.2  Nitrite, UA     Negative Negative  Microscopic Examination      See below:   Component     Latest Ref Rng & Units 03/17/2020  WBC, UA     0 - 5 /hpf 11-30 (A)  RBC     0 - 2 /hpf None seen  Epithelial Cells (non renal)     0 - 10 /hpf 0-10  Renal Epithel, UA     None seen /hpf 0-10 (A)  Bacteria, UA     None seen/Few None seen   I have reviewed the labs.  Pertinent Imaging: Results for Debbie, Sanchez (MRN [id number]) as of 03/17/2020 14:27  Ref. Range 03/17/2020 14:11  Scan Result Unknown 34ml    Assessment & Plan:    1. History of hematuria (high risk) Hematuria work up completed in  10/2017 - findings positive for urethral caruncle  No reports of gross hematuria Today's UA is negative for micro heme Recheck UA in one year  2. OAB Failed Detrol and Myrbetriq. She would like to be scheduled for PTNS at this time as she has failed medical treatment   3. Right renal stone No intervention at this time  Return for schedule PTNS.  These notes generated with voice recognition software. I apologize for typographical errors.  Zara Council, PA-C  [facility] [address] [place]  [place], [place] [address] [phone]

## 2020-03-17 ENCOUNTER — Ambulatory Visit (INDEPENDENT_AMBULATORY_CARE_PROVIDER_SITE_OTHER): Payer: Managed Care, Other (non HMO) | Admitting: Urology

## 2020-03-17 ENCOUNTER — Encounter: Payer: Self-pay | Admitting: Urology

## 2020-03-17 ENCOUNTER — Other Ambulatory Visit: Payer: Self-pay

## 2020-03-17 VITALS — BP 104/64 | HR 93 | Ht 60.0 in | Wt 100.0 lb

## 2020-03-17 DIAGNOSIS — R310 Gross hematuria: Secondary | ICD-10-CM

## 2020-03-17 DIAGNOSIS — N200 Calculus of kidney: Secondary | ICD-10-CM

## 2020-03-17 DIAGNOSIS — R350 Frequency of micturition: Secondary | ICD-10-CM

## 2020-03-17 LAB — BLADDER SCAN AMB NON-IMAGING

## 2020-03-18 LAB — URINALYSIS, COMPLETE
Bilirubin, UA: NEGATIVE
Glucose, UA: NEGATIVE
Ketones, UA: NEGATIVE
Nitrite, UA: NEGATIVE
Protein,UA: NEGATIVE
RBC, UA: NEGATIVE
Specific Gravity, UA: 1.025 (ref 1.005–1.030)
Urobilinogen, Ur: 0.2 mg/dL (ref 0.2–1.0)
pH, UA: 5.5 (ref 5.0–7.5)

## 2020-03-18 LAB — MICROSCOPIC EXAMINATION
Bacteria, UA: NONE SEEN
RBC, Urine: NONE SEEN /hpf (ref 0–2)

## 2020-03-20 ENCOUNTER — Telehealth: Payer: Self-pay | Admitting: Urology

## 2020-03-20 NOTE — Telephone Encounter (Signed)
I have completed her note.

## 2020-03-27 ENCOUNTER — Telehealth: Payer: Self-pay | Admitting: Urology

## 2020-03-27 NOTE — Telephone Encounter (Signed)
Has patient been approved for her PTNS?  If so, can we get her scheduled?

## 2020-03-28 NOTE — Telephone Encounter (Signed)
PA has been approved for PTNS Authorization # [id number] 03/17/2020-09/17/2020

## 2020-04-05 ENCOUNTER — Other Ambulatory Visit: Payer: Self-pay

## 2020-04-05 ENCOUNTER — Ambulatory Visit (INDEPENDENT_AMBULATORY_CARE_PROVIDER_SITE_OTHER): Payer: Managed Care, Other (non HMO)

## 2020-04-05 DIAGNOSIS — R350 Frequency of micturition: Secondary | ICD-10-CM

## 2020-04-05 NOTE — Patient Instructions (Signed)
Tracking Your Bladder Symptoms    Patient Name:___________________________________________________   Sample: Day   Daytime Voids  Nighttime Voids Urgency for the Day(0-4) Number of Accidents Beverage Comments  Monday IIII II 2 I Water IIII Coffee  I      Week Starting:____________________________________   Day Daytime  Voids Nighttime  Voids Urgency for  The Day(0-4) Number of Accidents Beverages Comments                                                           This week my symptoms were:  O much better  O better O the same O worse   

## 2020-04-05 NOTE — Progress Notes (Signed)
PTNS  Session # 1  Health & Social Factors: N/A Caffeine: 1 Alcohol:0 Daytime voids #per day: 12-15 Night-time voids #per night: 2 Urgency: Mild Incontinence Episodes #per day: 0 Ankle used: Right Treatment Setting: 3 Feeling/ Response: Sensory & Toe Flex  Comments: Pt watched introduction video, consent form reviewed and signed. See Media.  Performed By: Gordy Clement, CMA  Follow Up: RTC in 1 week as scheduled.

## 2020-04-12 ENCOUNTER — Ambulatory Visit (INDEPENDENT_AMBULATORY_CARE_PROVIDER_SITE_OTHER): Payer: Managed Care, Other (non HMO)

## 2020-04-12 ENCOUNTER — Other Ambulatory Visit: Payer: Self-pay

## 2020-04-12 ENCOUNTER — Ambulatory Visit: Payer: Managed Care, Other (non HMO) | Admitting: Dermatology

## 2020-04-12 DIAGNOSIS — R350 Frequency of micturition: Secondary | ICD-10-CM | POA: Diagnosis not present

## 2020-04-12 NOTE — Patient Instructions (Signed)
Tracking Your Bladder Symptoms    Patient Name:___________________________________________________   Sample: Day   Daytime Voids  Nighttime Voids Urgency for the Day(0-4) Number of Accidents Beverage Comments  Monday IIII II 2 I Water IIII Coffee  I      Week Starting:____________________________________   Day Daytime  Voids Nighttime  Voids Urgency for  The Day(0-4) Number of Accidents Beverages Comments                                                           This week my symptoms were:  O much better  O better O the same O worse   

## 2020-04-12 NOTE — Progress Notes (Signed)
PTNS  Session # 2  Health & Social Factors: N/A Caffeine: 1 Alcohol: 0 Daytime voids #per day: 12-15 Night-time voids #per night: 1-2 Urgency: Mild Incontinence Episodes #per day: 0 Ankle used: Left Treatment Setting: 8 Feeling/ Response: Sensory & Toe Flex  Comments: N/A  Performed By: Gordy Clement, CMA   Follow Up: RTC in 1 week as scheduled

## 2020-04-19 ENCOUNTER — Other Ambulatory Visit: Payer: Self-pay

## 2020-04-19 ENCOUNTER — Ambulatory Visit (INDEPENDENT_AMBULATORY_CARE_PROVIDER_SITE_OTHER): Payer: Managed Care, Other (non HMO)

## 2020-04-19 DIAGNOSIS — R350 Frequency of micturition: Secondary | ICD-10-CM | POA: Diagnosis not present

## 2020-04-19 NOTE — Progress Notes (Signed)
PTNS  Session # 3  Health & Social Factors: No change Caffeine: 1 Alcohol: 0 Daytime voids #per day: 9-11 Night-time voids #per night: 1-3 Urgency: Strong Incontinence Episodes #per day: 1 per week Ankle used: Right Treatment Setting: 4 Feeling/ Response: Toe flex and sensory Comments: Patient tolerated well, no complications were noted.    Performed By: Bradly Bienenstock, CMA   Follow Up: RTC in 1 week for session #4.

## 2020-04-19 NOTE — Patient Instructions (Signed)
Tracking Your Bladder Symptoms    Patient Name:___________________________________________________   Sample: Day   Daytime Voids  Nighttime Voids Urgency for the Day(0-4) Number of Accidents Beverage Comments  Monday IIII II 2 I Water IIII Coffee  I      Week Starting:____________________________________   Day Daytime  Voids Nighttime  Voids Urgency for  The Day(0-4) Number of Accidents Beverages Comments                                                           This week my symptoms were:  O much better  O better O the same O worse   

## 2020-04-26 ENCOUNTER — Ambulatory Visit (INDEPENDENT_AMBULATORY_CARE_PROVIDER_SITE_OTHER): Payer: Managed Care, Other (non HMO) | Admitting: Family Medicine

## 2020-04-26 ENCOUNTER — Other Ambulatory Visit: Payer: Self-pay

## 2020-04-26 DIAGNOSIS — R350 Frequency of micturition: Secondary | ICD-10-CM | POA: Diagnosis not present

## 2020-04-26 NOTE — Progress Notes (Signed)
PTNS  Session # 4  Health & Social Factors: no change Caffeine: 1 Alcohol: 0 Daytime voids #per day: 8 Night-time voids #per night: 1-2 Urgency: mild Incontinence Episodes #per day: 0 Ankle used: left Treatment Setting: 3 Feeling/ Response: both Comments: patient tolerated well  Performed By: Elberta Leatherwood, CMA  Follow Up: 1 week #5

## 2020-05-03 ENCOUNTER — Ambulatory Visit (INDEPENDENT_AMBULATORY_CARE_PROVIDER_SITE_OTHER): Payer: Managed Care, Other (non HMO) | Admitting: *Deleted

## 2020-05-03 ENCOUNTER — Other Ambulatory Visit: Payer: Self-pay

## 2020-05-03 DIAGNOSIS — R310 Gross hematuria: Secondary | ICD-10-CM | POA: Diagnosis not present

## 2020-05-03 DIAGNOSIS — R350 Frequency of micturition: Secondary | ICD-10-CM

## 2020-05-03 NOTE — Progress Notes (Signed)
  PTNS  Session # 5  Health & Social Factors: no change Caffeine: 1 Alcohol: 0 Daytime voids #per day: 8 Night-time voids #per night: 1-2 Urgency: mild Incontinence Episodes #per day: 0 Ankle used: right Treatment Setting: 5 Feeling/ Response: both Comments: patient tolerated well  Performed By: Gaspar Cola CMA  Follow Up: 1 week #6

## 2020-05-10 ENCOUNTER — Other Ambulatory Visit: Payer: Self-pay

## 2020-05-10 ENCOUNTER — Ambulatory Visit (INDEPENDENT_AMBULATORY_CARE_PROVIDER_SITE_OTHER): Payer: Managed Care, Other (non HMO)

## 2020-05-10 DIAGNOSIS — R350 Frequency of micturition: Secondary | ICD-10-CM | POA: Diagnosis not present

## 2020-05-10 DIAGNOSIS — R310 Gross hematuria: Secondary | ICD-10-CM

## 2020-05-10 NOTE — Patient Instructions (Signed)
Tracking Your Bladder Symptoms    Patient Name:___________________________________________________   Sample: Day   Daytime Voids  Nighttime Voids Urgency for the Day(0-4) Number of Accidents Beverage Comments  Monday IIII II 2 I Water IIII Coffee  I      Week Starting:____________________________________   Day Daytime  Voids Nighttime  Voids Urgency for  The Day(0-4) Number of Accidents Beverages Comments                                                           This week my symptoms were:  O much better  O better O the same O worse   

## 2020-05-10 NOTE — Progress Notes (Signed)
PTNS  Session #6  Health & Social Factors: Patient's mother has been in the hospital she has been unable to keep voiding diary this week. Caffeine: 1 Alcohol: 0 Daytime voids #per day: 8 Night-time voids #per night: 1-2  Urgency: Mild Incontinence Episodes #per day: 0 Ankle used: Left Treatment Setting: 4 Feeling/ Response: Toe Flex & Sensory  Comments: Pt tolerated well   Performed By: Gordy Clement, CMA   Follow Up:RTC in 1 week

## 2020-05-17 ENCOUNTER — Other Ambulatory Visit: Payer: Self-pay

## 2020-05-17 ENCOUNTER — Ambulatory Visit (INDEPENDENT_AMBULATORY_CARE_PROVIDER_SITE_OTHER): Payer: Managed Care, Other (non HMO) | Admitting: *Deleted

## 2020-05-17 DIAGNOSIS — R350 Frequency of micturition: Secondary | ICD-10-CM | POA: Diagnosis not present

## 2020-05-17 NOTE — Progress Notes (Signed)
  PTNS  Session #7  Health & Social Factors: Patient's mother has been in the hospital she has been unable to keep voiding diary this week. Caffeine: 1 Alcohol: 0 Daytime voids #per day: 8 Night-time voids #per night: 1-2          Urgency: Mild Incontinence Episodes #per day: 0 Ankle used: right Treatment Setting: 3 Feeling/ Response: Toe Flex & Sensory  Comments: Pt tolerated well   Performed By: France Ravens   Follow Up:RTC in 1 week

## 2020-05-24 ENCOUNTER — Other Ambulatory Visit: Payer: Self-pay

## 2020-05-24 ENCOUNTER — Ambulatory Visit (INDEPENDENT_AMBULATORY_CARE_PROVIDER_SITE_OTHER): Payer: Managed Care, Other (non HMO) | Admitting: Family Medicine

## 2020-05-24 DIAGNOSIS — R350 Frequency of micturition: Secondary | ICD-10-CM | POA: Diagnosis not present

## 2020-05-24 NOTE — Progress Notes (Signed)
PTNS  Session # 8  Health & Social Factors: no charge Caffeine: 3 Alcohol: 0 Daytime voids #per day: 7 Night-time voids #per night: 1 Urgency: mild Incontinence Episodes #per day: 0 Ankle used: left Treatment Setting: 4 Feeling/ Response: sensory Comments: Patient tolerated well  Performed By: Elberta Leatherwood, CMA  Follow Up: 1 week #9

## 2020-05-31 ENCOUNTER — Other Ambulatory Visit: Payer: Self-pay

## 2020-05-31 ENCOUNTER — Ambulatory Visit (INDEPENDENT_AMBULATORY_CARE_PROVIDER_SITE_OTHER): Payer: Managed Care, Other (non HMO) | Admitting: Urology

## 2020-05-31 DIAGNOSIS — N390 Urinary tract infection, site not specified: Secondary | ICD-10-CM

## 2020-05-31 DIAGNOSIS — Z13820 Encounter for screening for osteoporosis: Secondary | ICD-10-CM

## 2020-05-31 DIAGNOSIS — R350 Frequency of micturition: Secondary | ICD-10-CM

## 2020-05-31 MED ORDER — SULFAMETHOXAZOLE-TRIMETHOPRIM 800-160 MG PO TABS: 1.0000 | ORAL_TABLET | Freq: Two times a day (BID) | ORAL | 0 refills | Status: DC

## 2020-05-31 NOTE — Progress Notes (Signed)
PTNS  Session # 9  Health & Social Factors: No changes  Caffeine: 1  Alcohol: 0 Daytime voids #per day: 7 Night-time voids #per night: 1 Urgency: Mild Incontinence Episodes #per day: 0-1 avg Ankle used: Right Treatment Setting: 4 Feeling/ Response: Toe flex Comments: Patient tolerated well, no complications were noted.   Performed By: Bradly Bienenstock, CMA   Follow Up:RTC in 1 week for Session #10.

## 2020-05-31 NOTE — Addendum Note (Signed)
Addended by: Zara Council A on: 05/31/2020 05:21 PM   Modules accepted: Orders

## 2020-05-31 NOTE — Addendum Note (Signed)
Addended by: Evelina Bucy on: 05/31/2020 11:34 AM   Modules accepted: Orders

## 2020-06-01 LAB — URINALYSIS, COMPLETE
Bilirubin, UA: NEGATIVE
Glucose, UA: NEGATIVE
Ketones, UA: NEGATIVE
Nitrite, UA: NEGATIVE
Specific Gravity, UA: 1.015 (ref 1.005–1.030)
Urobilinogen, Ur: 0.2 mg/dL (ref 0.2–1.0)
pH, UA: 6 (ref 5.0–7.5)

## 2020-06-01 LAB — MICROSCOPIC EXAMINATION: RBC, Urine: >30 /hpf — AB (ref 0–2)

## 2020-06-03 LAB — CULTURE, URINE COMPREHENSIVE

## 2020-06-06 ENCOUNTER — Ambulatory Visit: Payer: Managed Care, Other (non HMO) | Admitting: Nurse Practitioner

## 2020-06-06 ENCOUNTER — Other Ambulatory Visit: Payer: Self-pay

## 2020-06-06 ENCOUNTER — Telehealth: Payer: Self-pay

## 2020-06-06 ENCOUNTER — Encounter: Payer: Self-pay | Admitting: Nurse Practitioner

## 2020-06-06 VITALS — BP 112/74 | HR 87 | Temp 97.8°F | Ht 60.0 in | Wt 101.0 lb

## 2020-06-06 DIAGNOSIS — R7303 Prediabetes: Secondary | ICD-10-CM | POA: Diagnosis not present

## 2020-06-06 DIAGNOSIS — M79642 Pain in left hand: Secondary | ICD-10-CM | POA: Insufficient documentation

## 2020-06-06 DIAGNOSIS — M79641 Pain in right hand: Secondary | ICD-10-CM | POA: Insufficient documentation

## 2020-06-06 DIAGNOSIS — G609 Hereditary and idiopathic neuropathy, unspecified: Secondary | ICD-10-CM

## 2020-06-06 DIAGNOSIS — B958 Unspecified staphylococcus as the cause of diseases classified elsewhere: Secondary | ICD-10-CM

## 2020-06-06 MED ORDER — MELOXICAM 7.5 MG PO TABS: 7.5000 mg | ORAL_TABLET | Freq: Every day | ORAL | 0 refills | Status: AC

## 2020-06-06 MED ORDER — AMOXICILLIN-POT CLAVULANATE 875-125 MG PO TABS: 1.0000 | ORAL_TABLET | Freq: Two times a day (BID) | ORAL | 0 refills | Status: DC

## 2020-06-06 NOTE — Telephone Encounter (Signed)
-----   Message from Nori Riis, PA-C sent at 06/05/2020  8:35 PM EDT ----- Please let [name] know that her urine culture was positive for infection and we need to start Augmentin 875/125, BID x 7 days.  We will need to recheck her urine in one month to make sure her micro heme has resolved.

## 2020-06-06 NOTE — Patient Instructions (Addendum)
Please go to the lab today.  Trial of meloxicam 7.5 mg 1 daily.  Discontinue if it gives you stomach upset.  Take with food.  Follow-up with Dr. Derrel Nip next week as you have planned.  Apply warm moist heat 10 minutes 4 times a day may alternate in between ice pack over o'clock up to 10 minutes couple times a day if it seems to help.  Consider x-ray, splint, orthopedic referral if no improvement.  Activity  Take breaks from repetitive activity often.  Avoid activities that make your pain worse.  Minimize stress on your hands and wrists as much as possible.  Do stretches or exercises as told by your health care provider.  Do not do activities that make your pain worse. Contact a health care provider if:  Your pain does not get better after a few days of self-care.  Your pain gets worse.  Your pain affects your ability to do your daily activities. Get help right away if:  Your hand becomes warm, red, or swollen.  Your hand is numb or tingling.  Your hand is extremely swollen or deformed.  Your hand or fingers turn white or blue.  You cannot move your hand, wrist, or fingers.  Consider x-ray, splint, orthopedic referral if no improvement.  Hand Pain Many things can cause hand pain. Some common causes are:  An injury.  Repeating the same movement with your hand over and over (overuse).  Osteoporosis.  Arthritis.  Lumps in the tendons or joints of the hand and wrist (ganglion cysts).  Nerve compression syndromes (carpal tunnel syndrome).  Inflammation of the tendons (tendinitis).  Infection. Follow these instructions at home: Pay attention to any changes in your symptoms. Take these actions to help with your discomfort: Managing pain, stiffness, and swelling   Take over-the-counter and prescription medicines only as told by your health care provider.  Wear a hand splint or support as told by your health care provider.  If directed, put ice on the affected  area: ? Put ice in a plastic bag. ? Place a towel between your skin and the bag. ? Leave the ice on for 20 minutes, 2-3 times a day. Activity  Take breaks from repetitive activity often.  Avoid activities that make your pain worse.  Minimize stress on your hands and wrists as much as possible.  Do stretches or exercises as told by your health care provider.  Do not do activities that make your pain worse. Contact a health care provider if:  Your pain does not get better after a few days of self-care.  Your pain gets worse.  Your pain affects your ability to do your daily activities. Get help right away if:  Your hand becomes warm, red, or swollen.  Your hand is numb or tingling.  Your hand is extremely swollen or deformed.  Your hand or fingers turn white or blue.  You cannot move your hand, wrist, or fingers. Summary  Many things can cause hand pain.  Contact your health care provider if your pain does not get better after a few days of self care.  Minimize stress on your hands and wrists as much as possible.  Do not do activities that make your pain worse. This information is not intended to replace advice given to you by your health care provider. Make sure you discuss any questions you have with your health care provider. Document Revised: 05/02/2018 Document Reviewed: 05/02/2018 Elsevier Patient Education  [place].

## 2020-06-06 NOTE — Telephone Encounter (Signed)
Called pt no answer. Left detailed message for pt per DPR. RX sent in.

## 2020-06-07 ENCOUNTER — Ambulatory Visit (INDEPENDENT_AMBULATORY_CARE_PROVIDER_SITE_OTHER): Payer: Managed Care, Other (non HMO) | Admitting: *Deleted

## 2020-06-07 DIAGNOSIS — R350 Frequency of micturition: Secondary | ICD-10-CM | POA: Diagnosis not present

## 2020-06-07 LAB — VITAMIN D 25 HYDROXY (VIT D DEFICIENCY, FRACTURES): VITD: 41.11 ng/mL (ref 30.00–100.00)

## 2020-06-07 LAB — COMPREHENSIVE METABOLIC PANEL
ALT: 17 U/L (ref 0–35)
AST: 20 U/L (ref 0–37)
Albumin: 3.9 g/dL (ref 3.5–5.2)
Alkaline Phosphatase: 74 U/L (ref 39–117)
BUN: 16 mg/dL (ref 6–23)
CO2: 29 mEq/L (ref 19–32)
Calcium: 9.1 mg/dL (ref 8.4–10.5)
Chloride: 102 mEq/L (ref 96–112)
Creatinine, Ser: 0.76 mg/dL (ref 0.40–1.20)
GFR: 82.85 mL/min (ref >60.00)
Glucose, Bld: 88 mg/dL (ref 70–99)
Potassium: 4.4 mEq/L (ref 3.5–5.1)
Sodium: 137 mEq/L (ref 135–145)
Total Bilirubin: 0.4 mg/dL (ref 0.2–1.2)
Total Protein: 6.6 g/dL (ref 6.0–8.3)

## 2020-06-07 LAB — CBC WITH DIFFERENTIAL/PLATELET
Basophils Absolute: 0.1 10*3/uL (ref 0.0–0.1)
Basophils Relative: 0.9 % (ref 0.0–3.0)
Eosinophils Absolute: 0.1 10*3/uL (ref 0.0–0.7)
Eosinophils Relative: 1.7 % (ref 0.0–5.0)
HCT: 41 % (ref 36.0–46.0)
Hemoglobin: 13.8 g/dL (ref 12.0–15.0)
Lymphocytes Relative: 28.6 % (ref 12.0–46.0)
Lymphs Abs: 1.9 10*3/uL (ref 0.7–4.0)
MCHC: 33.6 g/dL (ref 30.0–36.0)
MCV: 91.7 fl (ref 78.0–100.0)
Monocytes Absolute: 0.7 10*3/uL (ref 0.1–1.0)
Monocytes Relative: 10.5 % (ref 3.0–12.0)
Neutro Abs: 3.9 10*3/uL (ref 1.4–7.7)
Neutrophils Relative %: 58.3 % (ref 43.0–77.0)
Platelets: 236 10*3/uL (ref 150.0–400.0)
RBC: 4.48 Mil/uL (ref 3.87–5.11)
RDW: 12.4 % (ref 11.5–15.5)
WBC: 6.6 10*3/uL (ref 4.0–10.5)

## 2020-06-07 LAB — B12 AND FOLATE PANEL
Folate: 16.8 ng/mL (ref >5.9)
Vitamin B-12: 1016 pg/mL — ABNORMAL HIGH (ref 211–911)

## 2020-06-07 LAB — LIPID PANEL
Cholesterol: 197 mg/dL (ref 0–200)
HDL: 70 mg/dL (ref >39.00)
LDL Cholesterol: 115 mg/dL — ABNORMAL HIGH (ref 0–99)
NonHDL: 126.53
Total CHOL/HDL Ratio: 3
Triglycerides: 59 mg/dL (ref 0.0–149.0)
VLDL: 11.8 mg/dL (ref 0.0–40.0)

## 2020-06-07 LAB — TSH: TSH: 1.29 u[IU]/mL (ref 0.35–4.50)

## 2020-06-07 LAB — HEMOGLOBIN A1C: Hgb A1c MFr Bld: 6 % (ref 4.6–6.5)

## 2020-06-07 NOTE — Progress Notes (Signed)
PTNS  Session # 10  Health & Social Factors: No changes  Caffeine: 1  Alcohol: 0 Daytime voids #per day: 7 Night-time voids #per night: 1 Urgency: Mild Incontinence Episodes #per day: 0-1 avg Ankle used: Left  Treatment Setting: 6 Feeling/ Response: Toe flex Comments: Patient tolerated well, no complications were noted.   Performed By: Gaspar Cola CMA   Follow Up:RTC in 1 week for Session #11

## 2020-06-09 NOTE — Progress Notes (Signed)
Established Patient Office Visit  Subjective:  Patient ID: Debbie Sanchez, female    DOB: Sep 06, 1955  Age: 64 y.o. MRN: [id number]  CC:  Chief Complaint  Patient presents with  . Acute Visit    Right hand pain    HPI Debbie Sanchez is a 64 year old right-hand-dominant female who presents for right hand pain that has been present intermittently for 5 years. She works with [employer]. She has been working a lot in [employer] with excessive use of her hands.  Over the years, she will mainly have a discomfort in her palm along the base of the MCP joints.  It especially hurts when she closes her hands.  She will also have random pain described as a bruise sensation that jumps from finger to finger and always on the ventral surface.  She will get intermittent stiffness and pain.  No swelling or erythema. Patient retains full strength and use of her hand. The left hand does not bother her.  No other joints bother her.    Last Wednesday, she fell asleep on the couch holding her iPhone and that seemed to exacerbate the problem and she has not been able to resolve the discomfort with ibuprofen, application of ice or heat.  She has been researching and thinks she may have palmar fasciitis. No specific injury or trauma to this hand.     Past Medical History:  Diagnosis Date  . Fibrocystic breast disease    rt breast aspiration 2002  . Hx of dysplastic nevus 03/11/2018   Upper back right paraspinal  . Hx of dysplastic nevus 12/19/2016   R inframammary, mild to moderate  . Menopause    last menses 2008    Past Surgical History:  Procedure Laterality Date  . BREAST BIOPSY Right 2010   cyst aspiration/US guided biopsy, clip placement  . BREAST CYST ASPIRATION Right 2002   cyst aspiration  . CESAREAN SECTION  1998  . MOUTH SURGERY    . TUBAL LIGATION      Family History  Problem Relation Age of Onset  . Cancer Mother 60       colon CA / resected completely 1999  . Cancer Maternal Aunt         breast CA  . Breast cancer Maternal Aunt        2 mat great aunts    Social History   Socioeconomic History  . Marital status: Widowed    Spouse name: Not on file  . Number of children: Not on file  . Years of education: Not on file  . Highest education level: Not on file  Occupational History  . Not on file  Tobacco Use  . Smoking status: Never Smoker  . Smokeless tobacco: Never Used  Substance and Sexual Activity  . Alcohol use: Yes    Comment: rare  . Drug use: No  . Sexual activity: Not on file  Other Topics Concern  . Not on file  Social History Narrative  . Not on file   Social Determinants of Health   Financial Resource Strain:   . Difficulty of Paying Living Expenses: Not on file  Food Insecurity:   . Worried About Charity fundraiser in the Last Year: Not on file  . Ran Out of Food in the Last Year: Not on file  Transportation Needs:   . Lack of Transportation (Medical): Not on file  . Lack of Transportation (Non-Medical): Not on file  Physical Activity:   .  Days of Exercise per Week: Not on file  . Minutes of Exercise per Session: Not on file  Stress:   . Feeling of Stress : Not on file  Social Connections:   . Frequency of Communication with Friends and Family: Not on file  . Frequency of Social Gatherings with Friends and Family: Not on file  . Attends Religious Services: Not on file  . Active Member of Clubs or Organizations: Not on file  . Attends Archivist Meetings: Not on file  . Marital Status: Not on file  Intimate Partner Violence:   . Fear of Current or Ex-Partner: Not on file  . Emotionally Abused: Not on file  . Physically Abused: Not on file  . Sexually Abused: Not on file    Outpatient Medications Prior to Visit  Medication Sig Dispense Refill  . Ascorbic Acid (VITAMIN C) 100 MG tablet Take 1,000 mg by mouth 2 (two) times daily.     . cholecalciferol (VITAMIN D3) 25 MCG (1000 UT) tablet Take 1,000 Units by mouth daily.      . Cyanocobalamin (VITAMIN B 12) 500 MCG TABS Take by mouth.    . MELATONIN ER PO Take by mouth.    . Multiple Vitamin (MULTIVITAMIN) tablet Take 1 tablet by mouth daily.    . TURMERIC PO Take by mouth.    . vitamin E 600 UNIT capsule Take 600 Units by mouth daily.    Marland Kitchen zinc gluconate 50 MG tablet Take 50 mg by mouth daily.    Marland Kitchen amoxicillin-clavulanate (AUGMENTIN) 875-125 MG tablet Take 1 tablet by mouth 2 (two) times daily. (Patient not taking: Reported on 06/06/2020) 14 tablet 0  . aspirin 81 MG EC tablet Take 81 mg by mouth as needed. Swallow whole.  (Patient not taking: Reported on 06/06/2020)    . sulfamethoxazole-trimethoprim (BACTRIM DS) 800-160 MG tablet Take 1 tablet by mouth every 12 (twelve) hours. (Patient not taking: Reported on 06/06/2020) 14 tablet 0   No facility-administered medications prior to visit.    No Known Allergies  Review of Systems  Constitutional: Negative.   HENT: Negative.   Respiratory: Negative.   Cardiovascular: Negative.   Musculoskeletal: Negative for arthralgias, back pain, gait problem, joint swelling and myalgias.       See HPI      Objective:    Physical Exam Vitals reviewed.  Constitutional:      Appearance: She is normal weight.  HENT:     Head: Normocephalic and atraumatic.  Cardiovascular:     Rate and Rhythm: Normal rate and regular rhythm.     Pulses: Normal pulses.     Heart sounds: Normal heart sounds.  Musculoskeletal:        General: Normal range of motion.     Cervical back: Normal range of motion and neck supple.     Comments: Right appears normal and has normal ROM. Very strong grasp bilat. Normal sensation. Mild tenderness along ventral base of MCP joints. Joints palpate normal without deformity.  No synovitis.  No erythema.  Skin:    General: Skin is warm and dry.  Neurological:     General: No focal deficit present.     Mental Status: She is alert.  Psychiatric:        Mood and Affect: Mood normal.         Behavior: Behavior normal.     BP 112/74 (BP Location: Left Arm, Patient Position: Sitting, Cuff Size: Normal)   Pulse 87  Temp 97.8 F (36.6 C) (Oral)   Ht 5' (1.524 m)   Wt 101 lb (45.8 kg)   SpO2 98%   BMI 19.73 kg/m  Wt Readings from Last 3 Encounters:  06/06/20 101 lb (45.8 kg)  03/17/20 100 lb (45.4 kg)  07/03/19 101 lb 9.6 oz (46.1 kg)   Pulse Readings from Last 3 Encounters:  06/06/20 87  03/17/20 93  07/01/19 61    BP Readings from Last 3 Encounters:  06/06/20 112/74  03/17/20 (!) 104/64  07/01/19 (!) 118/58    Lab Results  Component Value Date   CHOL 197 06/06/2020   HDL 70.00 06/06/2020   LDLCALC 115 (H) 06/06/2020   TRIG 59.0 06/06/2020   CHOLHDL 3 06/06/2020      Health Maintenance Due  Topic Date Due  . TETANUS/TDAP  03/03/2020  . INFLUENZA VACCINE  03/20/2020  . PAP SMEAR-Modifier  06/07/2020    There are no preventive care reminders to display for this patient.  Lab Results  Component Value Date   TSH 1.29 06/06/2020   Lab Results  Component Value Date   WBC 6.6 06/06/2020   HGB 13.8 06/06/2020   HCT 41.0 06/06/2020   MCV 91.7 06/06/2020   PLT 236.0 06/06/2020   Lab Results  Component Value Date   NA 137 06/06/2020   K 4.4 06/06/2020   CO2 29 06/06/2020   GLUCOSE 88 06/06/2020   BUN 16 06/06/2020   CREATININE 0.76 06/06/2020   BILITOT 0.4 06/06/2020   ALKPHOS 74 06/06/2020   AST 20 06/06/2020   ALT 17 06/06/2020   PROT 6.6 06/06/2020   ALBUMIN 3.9 06/06/2020   CALCIUM 9.1 06/06/2020   ANIONGAP 11 07/01/2019   GFR 82.85 06/06/2020   Lab Results  Component Value Date   CHOL 197 06/06/2020   Lab Results  Component Value Date   HDL 70.00 06/06/2020   Lab Results  Component Value Date   LDLCALC 115 (H) 06/06/2020   Lab Results  Component Value Date   TRIG 59.0 06/06/2020   Lab Results  Component Value Date   CHOLHDL 3 06/06/2020   Lab Results  Component Value Date   HGBA1C 6.0 06/06/2020      Assessment  & Plan:   Problem List Items Addressed This Visit      Nervous and Auditory   Neuropathy, idiopathic   Relevant Orders   TSH (Completed)   VITAMIN D 25 Hydroxy (Vit-D Deficiency, Fractures) (Completed)   B12 and Folate Panel (Completed)     Other   Hand pain, right - Primary   Relevant Orders   CBC with Differential/Platelet (Completed)   TSH (Completed)   Comprehensive metabolic panel (Completed)   B12 and Folate Panel (Completed)    Other Visit Diagnoses    Pre-diabetes       Relevant Orders   Hemoglobin A1c (Completed)   Lipid panel (Completed)      Meds ordered this encounter  Medications  . meloxicam (MOBIC) 7.5 MG tablet    Sig: Take 1 tablet (7.5 mg total) by mouth daily for 7 days.    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  She also fell asleep for some unknown hours with her right hand clutching her iPhone.  This  seemed to exacerbate her discomfort and make her stiffness worse.  She had a flare of hand discomfort but continue to work.  Patient is going to take a break from excessive use of her hands. Suspect that she may have inflammation along the ventral MCP joints to the palmar surface.  She has plantar fasciitis.  She was reading up on it and thinks she may have palmar fasciitis as well.  Please go to the lab today.  I would like to check kidney function for use with a trial of meloxicam.  Patient is scheduled for her CPE with Dr. Derrel Nip on 06/16/2020.  Routine CPE labs also captured today.    Trial of meloxicam 7.5 mg 1 daily.  Discontinue if it gives you stomach upset.  Take with food.  Follow-up with Dr. Derrel Nip next week as you have planned.  Apply warm moist heat 10 minutes 4 times a day may alternate in between ice pack over o'clock up to 10 minutes couple times a day if it  seems to help.  Consider x-ray, splint, orthopedic referral if no improvement.  Activity  Take breaks from repetitive activity often.  Avoid activities that make your pain worse.  Minimize stress on your hands and wrists as much as possible.  Do stretches or exercises as told by your health care provider.  Do not do activities that make your pain worse. Contact a health care provider if:  Your pain does not get better after a few days of self-care.  Your pain gets worse.  Your pain affects your ability to do your daily activities. Get help right away if:  Your hand becomes warm, red, or swollen.  Your hand is numb or tingling.  Your hand is extremely swollen or deformed.  Your hand or fingers turn white or blue.  You cannot move your hand, wrist, or fingers.  Consider x-ray, splint, orthopedic referral if no improvement.  Follow-up: Return in about 10 days (around 06/16/2020).  This visit occurred during the SARS-CoV-2 public health emergency.  Safety protocols were in place, including screening questions prior to the visit, additional usage of staff PPE, and extensive cleaning of exam room while observing appropriate contact time as indicated for disinfecting solutions.    Denice Paradise, NP

## 2020-06-14 ENCOUNTER — Ambulatory Visit (INDEPENDENT_AMBULATORY_CARE_PROVIDER_SITE_OTHER): Payer: Managed Care, Other (non HMO)

## 2020-06-14 ENCOUNTER — Other Ambulatory Visit: Payer: Self-pay

## 2020-06-14 DIAGNOSIS — R350 Frequency of micturition: Secondary | ICD-10-CM | POA: Diagnosis not present

## 2020-06-14 NOTE — Progress Notes (Signed)
PTNS  Session # 11  Health & Social Factors: no change Caffeine: 2-3 Alcohol: 0 Daytime voids #per day: 7 Night-time voids #per night: 1 Urgency: mild Incontinence Episodes #per day: 0 Ankle used: right Treatment Setting: 1 Feeling/ Response: both Comments: patient tolerated well  Performed By: Fonnie Jarvis, CMA   Follow Up: 1 week

## 2020-06-14 NOTE — Patient Instructions (Signed)
Tracking Your Bladder Symptoms    Patient Name:___________________________________________________   Sample: Day   Daytime Voids  Nighttime Voids Urgency for the Day(0-4) Number of Accidents Beverage Comments  Monday IIII II 2 I Water IIII Coffee  I      Week Starting:____________________________________   Day Daytime  Voids Nighttime  Voids Urgency for  The Day(0-4) Number of Accidents Beverages Comments                                                           This week my symptoms were:  O much better  O better O the same O worse   

## 2020-06-16 ENCOUNTER — Other Ambulatory Visit (HOSPITAL_COMMUNITY)
Admission: RE | Admit: 2020-06-16 | Discharge: 2020-06-16 | Disposition: A | Discharge status: Home / Self Care | Payer: Managed Care, Other (non HMO) | Source: Ambulatory Visit | Attending: Internal Medicine | Admitting: Internal Medicine

## 2020-06-16 ENCOUNTER — Encounter: Payer: Self-pay | Admitting: Internal Medicine

## 2020-06-16 ENCOUNTER — Other Ambulatory Visit: Payer: Self-pay

## 2020-06-16 ENCOUNTER — Ambulatory Visit (INDEPENDENT_AMBULATORY_CARE_PROVIDER_SITE_OTHER): Payer: Managed Care, Other (non HMO) | Admitting: Internal Medicine

## 2020-06-16 VITALS — BP 96/64 | HR 72 | Temp 98.6°F | Resp 14 | Ht 60.0 in | Wt 100.4 lb

## 2020-06-16 DIAGNOSIS — Z1231 Encounter for screening mammogram for malignant neoplasm of breast: Secondary | ICD-10-CM | POA: Diagnosis not present

## 2020-06-16 DIAGNOSIS — Z124 Encounter for screening for malignant neoplasm of cervix: Secondary | ICD-10-CM

## 2020-06-16 DIAGNOSIS — Z0000 Encounter for general adult medical examination without abnormal findings: Secondary | ICD-10-CM | POA: Diagnosis not present

## 2020-06-16 DIAGNOSIS — Z01419 Encounter for gynecological examination (general) (routine) without abnormal findings: Secondary | ICD-10-CM

## 2020-06-16 DIAGNOSIS — Z1211 Encounter for screening for malignant neoplasm of colon: Secondary | ICD-10-CM | POA: Diagnosis not present

## 2020-06-16 DIAGNOSIS — R55 Syncope and collapse: Secondary | ICD-10-CM

## 2020-06-16 DIAGNOSIS — Z23 Encounter for immunization: Secondary | ICD-10-CM | POA: Diagnosis not present

## 2020-06-16 DIAGNOSIS — G250 Essential tremor: Secondary | ICD-10-CM

## 2020-06-16 MED ORDER — ALPRAZOLAM 0.25 MG PO TABS: 0.1250 mg | ORAL_TABLET | Freq: Every evening | ORAL | 0 refills | Status: DC | PRN

## 2020-06-16 NOTE — Patient Instructions (Addendum)
You can use 1/2 to 1 alprazolam for after midnight insomnia    Your recent episode of prolonged hand pain was likely due to tendonitis of metacarpal tendons  Use the meloxicam and ice as needed   Return for your tetanus vaccine in a few weeks   Flu vaccine today   You lowered your LDL by 30 pts with your [place] nut experiment!   Your annual mammogram has been ordered.  You are encouraged (required) to call to make your appointment at [place] [phone]

## 2020-06-16 NOTE — Progress Notes (Signed)
Patient ID: Debbie Sanchez, female    DOB: 1955-11-28  Age: 64 y.o. MRN: [id number]  The patient is here for annual well woman  examination and management of other chronic and acute problems.   The risk factors are reflected in the social history.  The roster of all physicians providing medical care to patient - is listed in the Snapshot section of the chart.  Activities of daily living:  The patient is 100% independent in all ADLs: dressing, toileting, feeding as well as independent mobility  Home safety : The patient has smoke detectors in the home. They wear seatbelts.  There are no firearms at home. There is no violence in the home.   There is no risks for hepatitis, STDs or HIV. There is no   history of blood transfusion. They have no travel history to infectious disease endemic areas of the world.  The patient has seen their dentist in the last six month. They have seen their eye doctor in the last year. She denies  hearing difficulty with regard to whispered voices and some television programs.  They have deferred audiologic testing in the last year.  They do not  have excessive sun exposure. Discussed the need for sun protection: hats, long sleeves and use of sunscreen if there is significant sun exposure.   Diet: the importance of a healthy diet is discussed. They do have a healthy diet.  The benefits of regular aerobic exercise were discussed. She walks 5 times per week ,  30 minutes.   Depression screen: there are no signs or vegative symptoms of depression- irritability, change in appetite, anhedonia, sadness/tearfullness.   The following portions of the patient's history were reviewed and updated as appropriate: allergies, current medications, past family history, past medical history,  past surgical history, past social history  and problem list.  Visual acuity was not assessed per patient preference since she has regular follow up with her ophthalmologist. Hearing and body mass  index were assessed and reviewed.   During the course of the visit the patient was educated and counseled about appropriate screening and preventive services including : fall prevention , diabetes screening, nutrition counseling, colorectal cancer screening, and recommended immunizations.    CC: The primary encounter diagnosis was Cervical cancer screening. Diagnoses of Encounter for screening mammogram for malignant neoplasm of breast, Visit for screening mammogram, Colon cancer screening, Well woman exam with routine gynecological exam, Need for immunization against influenza, Benign essential tremor, Encounter for preventive health examination, and Syncope and collapse were also pertinent to this visit.   Retired recently, mother started to decline, has moved her to nursing home at [facility].  Feeling stressed taking care of mom's financial  issues. Still making pottery .  Feels suddenly very busy  With several pottery shows coming up.    [name] has a past medical history of Fibrocystic breast disease, dysplastic nevus (03/11/2018), dysplastic nevus (12/19/2016), and Menopause.   She has a past surgical history that includes Tubal ligation; Cesarean section (1998); Mouth surgery; Breast cyst aspiration (Right, 2002); and Breast biopsy (Right, 2010).   Her family history includes Breast cancer in her maternal aunt; Cancer in her maternal aunt; Cancer (age of onset: 15) in her mother.She reports that she has never smoked. She has never used smokeless tobacco. She reports current alcohol use. She reports that she does not use drugs.  Outpatient Medications Prior to Visit  Medication Sig Dispense Refill  . Ascorbic Acid (VITAMIN C) 100 MG  tablet Take 1,000 mg by mouth 2 (two) times daily.     . cholecalciferol (VITAMIN D3) 25 MCG (1000 UT) tablet Take 1,000 Units by mouth daily.    . Cyanocobalamin (VITAMIN B 12) 500 MCG TABS Take by mouth.    . MELATONIN ER PO Take by mouth.    .  Multiple Vitamin (MULTIVITAMIN) tablet Take 1 tablet by mouth daily.    . TURMERIC PO Take by mouth.    . vitamin E 600 UNIT capsule Take 600 Units by mouth daily.    Marland Kitchen zinc gluconate 50 MG tablet Take 50 mg by mouth daily.     No facility-administered medications prior to visit.    Review of Systems  Patient denies headache, fevers, malaise, unintentional weight loss, skin rash, eye pain, sinus congestion and sinus pain, sore throat, dysphagia,  hemoptysis , cough, dyspnea, wheezing, chest pain, palpitations, orthopnea, edema, abdominal pain, nausea, melena, diarrhea, constipation, flank pain, dysuria, hematuria, urinary  Frequency, nocturia, numbness, tingling, seizures,  Focal weakness, Loss of consciousness,  Tremor, insomnia, depression, anxiety, and suicidal ideation.     Objective:  BP 96/64 (BP Location: Left Arm, Patient Position: Sitting, Cuff Size: Normal)   Pulse 72   Temp 98.6 F (37 C) (Oral)   Resp 14   Ht 5' (1.524 m)   Wt 100 lb 6.4 oz (45.5 kg)   SpO2 99%   BMI 19.61 kg/m   Physical Exam  General Appearance:    Alert, cooperative, no distress, appears stated age  Head:    Normocephalic, without obvious abnormality, atraumatic  Eyes:    PERRL, conjunctiva/corneas clear, EOM's intact, fundi    benign, both eyes  Ears:    Normal TM's and external ear canals, both ears  Nose:   Nares normal, septum midline, mucosa normal, no drainage    or sinus tenderness  Throat:   Lips, mucosa, and tongue normal; teeth and gums normal  Neck:   Supple, symmetrical, trachea midline, no adenopathy;    thyroid:  no enlargement/tenderness/nodules; no carotid   bruit or JVD  Back:     Symmetric, no curvature, ROM normal, no CVA tenderness  Lungs:     Clear to auscultation bilaterally, respirations unlabored  Chest Wall:    No tenderness or deformity   Heart:    Regular rate and rhythm, S1 and S2 normal, no murmur, rub   or gallop  Breast Exam:    No tenderness, masses, or nipple  abnormality  Abdomen:     Soft, non-tender, bowel sounds active all four quadrants,    no masses, no organomegaly  Genitalia:    Pelvic: cervix normal in appearance, external genitalia normal, no adnexal masses or tenderness, no cervical motion tenderness, rectovaginal septum normal, uterus normal size, shape, and consistency and vagina normal without discharge  Extremities:   Extremities normal, atraumatic, no cyanosis or edema  Pulses:   2+ and symmetric all extremities  Skin:   Skin color, texture, turgor normal, no rashes or lesions  Lymph nodes:   Cervical, supraclavicular, and axillary nodes normal  Neurologic:   CNII-XII intact, normal strength, sensation and reflexes    throughout    Assessment & Plan:   Problem List Items Addressed This Visit      Unprioritized   Encounter for preventive health examination    age appropriate education and counseling updated, referrals for preventative services and immunizations addressed, dietary and smoking counseling addressed, most recent labs reviewed.  I have personally reviewed and  have noted:  1) the patient's medical and social history 2) The pt's use of alcohol, tobacco, and illicit drugs 3) The patient's current medications and supplements 4) Functional ability including ADL's, fall risk, home safety risk, hearing and visual impairment 5) Diet and physical activities 6) Evidence for depression or mood disorder 7) The patient's height, weight, and BMI have been recorded in the chart  I have made referrals, and provided counseling and education based on review of the above        Benign essential tremor    Has not progressed       Cervical cancer screening - Primary    PAP smear done at visit and normal      Relevant Orders   Cytology - PAP( [place]) (Completed)   Syncope and collapse    No recurrence since Nov 2020        Other Visit Diagnoses    Encounter for screening mammogram for malignant neoplasm of  breast       Relevant Orders   MM 3D SCREEN BREAST BILATERAL   Visit for screening mammogram       Colon cancer screening       Well woman exam with routine gynecological exam       Need for immunization against influenza       Relevant Orders   Flu Vaccine QUAD 36+ mos IM (Completed)      I am having Debbie Sanchez. Debbie Sanchez start on ALPRAZolam. I am also having her maintain her multivitamin, vitamin C, MELATONIN ER PO, cholecalciferol, Vitamin B 12, zinc gluconate, vitamin E, and TURMERIC PO.  Meds ordered this encounter  Medications  . ALPRAZolam (XANAX) 0.25 MG tablet    Sig: Take 0.5 tablets (0.125 mg total) by mouth at bedtime as needed for sleep.    Dispense:  30 tablet    Refill:  0    There are no discontinued medications.  Follow-up: Return in about 1 year (around 06/16/2021).   Crecencio Mc, MD

## 2020-06-17 LAB — CYTOLOGY - PAP
Comment: NEGATIVE
Diagnosis: NEGATIVE
High risk HPV: NEGATIVE

## 2020-06-18 NOTE — Assessment & Plan Note (Signed)

## 2020-06-18 NOTE — Progress Notes (Signed)
I am pleased to inform you that your PAP smear was normal,  And your  HPV screen was negative.  We will one more time in 2 years    Dr Derrel Nip

## 2020-06-18 NOTE — Assessment & Plan Note (Signed)
Has not progressed

## 2020-06-18 NOTE — Assessment & Plan Note (Signed)
PAP smear done at visit and normal

## 2020-06-18 NOTE — Assessment & Plan Note (Signed)
No recurrence since Nov 2020

## 2020-06-21 ENCOUNTER — Ambulatory Visit (INDEPENDENT_AMBULATORY_CARE_PROVIDER_SITE_OTHER): Payer: Managed Care, Other (non HMO) | Admitting: Family Medicine

## 2020-06-21 ENCOUNTER — Other Ambulatory Visit: Payer: Self-pay

## 2020-06-21 DIAGNOSIS — R350 Frequency of micturition: Secondary | ICD-10-CM

## 2020-06-21 NOTE — Progress Notes (Signed)
PTNS  Session # 12  Health & Social Factors: no change Caffeine: 3 Alcohol: 0 Daytime voids #per day: 7 Night-time voids #per night: 1 Urgency: mild Incontinence Episodes #per day: 0 Ankle used: left Treatment Setting: 1 Feeling/ Response: both Comments: patient tolerated well  Performed By: Elberta Leatherwood, CMA  Follow Up: 1 month

## 2020-07-19 NOTE — Progress Notes (Signed)
07/20/2020 11:34 AM   Debbie Sanchez November 09, 1955 [id number]  Referring provider: Crecencio Mc, MD [place] [place],  [place] [address]  Chief Complaint  Patient presents with  . Urinary Frequency  . PTNS    HPI: Debbie Sanchez is 64 y.o. female with a PMH of high risk hematuria, frequency and kidney stones who presents today for follow up after completing her 12 weekly treatments of PTNS.  High risk hematuria Non-smoker.  CTU completed in 10/2017 noted the adrenal glands normal. Nonobstructing calculus in the mid RIGHT kidney measures 3 mm. No ureterolithiasis or obstructive uropathy. No enhancing renal lesion. Delayed imaging demonstrates no filling defects within collecting systems or ureters.  No bladder stones or filling defects in the bladder which does not excluded a bladder lesion.  Cystoscopy by Dr. 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  No reports of gross hematuria.  No micro heme on today's UA  Frequency Failed anticholinergics.  Did not take Myrbetriq due to potential side effects.   Completed 12 weeks of PTNS.  She states that she feels the PTNS has been minimally helpful.  Reviewing her log sheets, she has gone from 12 daytime urinations to 7 and 2 nighttime urinations down to 1.  She would like to continue monthly PTNS at this time to see if she can experience more improvement.  We may also consider a PTNS holiday to see if her symptoms worsen without the treatments in the future.  Right renal stone 3 mm right renal stone found incidentally on CTU in 10/2017.  No complaint of flank pain at this time.    PMH: Past Medical History:  Diagnosis Date  . Fibrocystic breast disease    rt breast aspiration 2002  . Hx of dysplastic nevus 03/11/2018   Upper back right paraspinal  . Hx of dysplastic nevus 12/19/2016   R inframammary, mild to moderate  . Menopause    last menses 2008    Surgical History: Past Surgical History:  Procedure  Laterality Date  . BREAST BIOPSY Right 2010   cyst aspiration/US guided biopsy, clip placement  . BREAST CYST ASPIRATION Right 2002   cyst aspiration  . CESAREAN SECTION  1998  . MOUTH SURGERY    . TUBAL LIGATION      Home Medications:  Allergies as of 07/20/2020   No Known Allergies     Medication List       Accurate as of July 20, 2020 11:34 AM. If you have any questions, ask your nurse or doctor.        ALPRAZolam 0.25 MG tablet Commonly known as: XANAX Take 0.5 tablets (0.125 mg total) by mouth at bedtime as needed for sleep.   cholecalciferol 25 MCG (1000 UNIT) tablet Commonly known as: VITAMIN D3 Take 1,000 Units by mouth daily.   MELATONIN ER PO Take by mouth.   multivitamin tablet Take 1 tablet by mouth daily.   TURMERIC PO Take by mouth.   Vitamin B 12 500 MCG Tabs Take by mouth.   vitamin C 100 MG tablet Take 1,000 mg by mouth 2 (two) times daily.   vitamin E 600 UNIT capsule Take 600 Units by mouth daily.   zinc gluconate 50 MG tablet Take 50 mg by mouth daily.       Allergies: No Known Allergies  Family History: Family History  Problem Relation Age of Onset  . Cancer Mother 44       colon CA /  resected completely 1999  . Cancer Maternal Aunt        breast CA  . Breast cancer Maternal Aunt        2 mat great aunts    Social History:  reports that she has never smoked. She has never used smokeless tobacco. She reports current alcohol use. She reports that she does not use drugs.  ROS: For pertinent review of systems please refer to history of present illness  Physical Exam: BP 111/73 (BP Location: Left Arm, Patient Position: Sitting, Cuff Size: Normal)   Pulse 77   Ht 5' (1.524 m)   Wt 103 lb (46.7 kg)   BMI 20.12 kg/m   Constitutional:  Well nourished. Alert and oriented, No acute distress. HEENT: Person AT, mask in place.  Trachea midline Cardiovascular: No clubbing, cyanosis, or edema. Respiratory: Normal respiratory effort,  no increased work of breathing. Neurologic: Grossly intact, no focal deficits, moving all 4 extremities. Psychiatric: Normal mood and affect.   Laboratory Data: Lab Results  Component Value Date   WBC 6.6 06/06/2020   HGB 13.8 06/06/2020   HCT 41.0 06/06/2020   MCV 91.7 06/06/2020   PLT 236.0 06/06/2020    Lab Results  Component Value Date   CREATININE 0.76 06/06/2020    Lab Results  Component Value Date   HGBA1C 6.0 06/06/2020    Lab Results  Component Value Date   TSH 1.29 06/06/2020       Component Value Date/Time   CHOL 197 06/06/2020 1459   CHOL 198 06/12/2017 0830   HDL 70.00 06/06/2020 1459   HDL 75 06/12/2017 0830   CHOLHDL 3 06/06/2020 1459   VLDL 11.8 06/06/2020 1459   LDLCALC 115 (H) 06/06/2020 1459   LDLCALC 115 (H) 06/12/2017 0830    Lab Results  Component Value Date   AST 20 06/06/2020   Lab Results  Component Value Date   ALT 17 06/06/2020    Urinalysis Component     Latest Ref Rng & Units 07/20/2020  Specific Gravity, UA     1.005 - 1.030 1.015  pH, UA     5.0 - 7.5 5.0  Color, UA     Yellow Yellow  Appearance Ur     Clear Clear  Leukocytes,UA     Negative Negative  Protein,UA     Negative/Trace Negative  Glucose, UA     Negative Negative  Ketones, UA     Negative Negative  RBC, UA     Negative Negative  Bilirubin, UA     Negative Negative  Urobilinogen, Ur     0.2 - 1.0 mg/dL 0.2  Nitrite, UA     Negative Negative  Microscopic Examination      See below:   Component     Latest Ref Rng & Units 07/20/2020  WBC, UA     0 - 5 /hpf 6-10 (A)  RBC     0 - 2 /hpf 0-2  Epithelial Cells (non renal)     0 - 10 /hpf 0-10  Bacteria, UA     None seen/Few None seen   I have reviewed the labs.  Pertinent Imaging: Results for ASRA, GAMBREL (MRN [id number]) as of 03/17/2020 14:27  Ref. Range 03/17/2020 14:11  Scan Result Unknown 45ml   PTNS  Session #monthly maintenance  Health & Social Factors: No change Caffeine:  3 Alcohol: 0 Daytime voids #per day: 7 Night-time voids #per night: 2-3 Urgency: mild Incontinence Episodes #per day: 0 Ankle  used: right Treatment Setting: 1 Feeling/ Response: Sensory Comments: Patient tolerated the procedure well  Performed By: Zara Council, PA-C   Assessment & Plan:    1. History of hematuria (high risk) Hematuria work up completed in 10/2017 - findings positive for urethral caruncle  No reports of gross hematuria Today's UA is negative for micro heme Recheck UA in one year  2. OAB Completed 12 weeks of PTNS with minimal improvement Monthly maintenance performed today She will return in 1 month for a maintenance PTNS  3. Right renal stone No intervention at this time  Return in about 1 month (around 08/20/2020) for Maintenance PTNS.  These notes generated with voice recognition software. I apologize for typographical errors.  Zara Council, PA-C  [facility] [address] [place]  [place], [place] [address] [phone]

## 2020-07-20 ENCOUNTER — Ambulatory Visit (INDEPENDENT_AMBULATORY_CARE_PROVIDER_SITE_OTHER): Payer: Managed Care, Other (non HMO) | Admitting: Urology

## 2020-07-20 ENCOUNTER — Other Ambulatory Visit: Payer: Self-pay

## 2020-07-20 ENCOUNTER — Encounter: Payer: Self-pay | Admitting: Urology

## 2020-07-20 VITALS — BP 111/73 | HR 77 | Ht 60.0 in | Wt 103.0 lb

## 2020-07-20 DIAGNOSIS — R319 Hematuria, unspecified: Secondary | ICD-10-CM

## 2020-07-20 DIAGNOSIS — R350 Frequency of micturition: Secondary | ICD-10-CM

## 2020-07-20 DIAGNOSIS — R3129 Other microscopic hematuria: Secondary | ICD-10-CM | POA: Diagnosis not present

## 2020-07-22 LAB — URINALYSIS, COMPLETE
Bilirubin, UA: NEGATIVE
Glucose, UA: NEGATIVE
Ketones, UA: NEGATIVE
Leukocytes,UA: NEGATIVE
Nitrite, UA: NEGATIVE
Protein,UA: NEGATIVE
RBC, UA: NEGATIVE
Specific Gravity, UA: 1.015 (ref 1.005–1.030)
Urobilinogen, Ur: 0.2 mg/dL (ref 0.2–1.0)
pH, UA: 5 (ref 5.0–7.5)

## 2020-07-22 LAB — MICROSCOPIC EXAMINATION: Bacteria, UA: NONE SEEN

## 2020-08-08 ENCOUNTER — Ambulatory Visit
Admission: RE | Admit: 2020-08-08 | Discharge: 2020-08-08 | Disposition: A | Discharge status: Home / Self Care | Payer: Managed Care, Other (non HMO) | Source: Ambulatory Visit | Attending: Internal Medicine | Admitting: Internal Medicine

## 2020-08-08 ENCOUNTER — Other Ambulatory Visit: Payer: Self-pay

## 2020-08-08 DIAGNOSIS — Z1231 Encounter for screening mammogram for malignant neoplasm of breast: Secondary | ICD-10-CM | POA: Diagnosis not present

## 2020-08-22 ENCOUNTER — Other Ambulatory Visit: Payer: Self-pay

## 2020-08-22 ENCOUNTER — Ambulatory Visit (INDEPENDENT_AMBULATORY_CARE_PROVIDER_SITE_OTHER): Payer: Managed Care, Other (non HMO) | Admitting: Family Medicine

## 2020-08-22 DIAGNOSIS — R350 Frequency of micturition: Secondary | ICD-10-CM | POA: Diagnosis not present

## 2020-08-22 NOTE — Progress Notes (Signed)
PTNS  Session # monthly  Health & Social Factors: no change Caffeine: 1 Alcohol: 0 Daytime voids #per day: 10 Night-time voids #per night: 1 Urgency: mild Incontinence Episodes #per day: 0 Ankle used: left Treatment Setting: 5 Feeling/ Response: Sensory Comments: Patient tolerated well  Performed By: Teressa Lower, CMA  Follow Up: 1 month

## 2020-09-19 ENCOUNTER — Other Ambulatory Visit: Payer: Self-pay | Admitting: Internal Medicine

## 2020-09-19 NOTE — Telephone Encounter (Signed)
RX Refill:xanax Last Seen:06-16-20 Last ordered:06-16-20

## 2020-09-26 ENCOUNTER — Other Ambulatory Visit: Payer: Self-pay

## 2020-09-26 ENCOUNTER — Ambulatory Visit (INDEPENDENT_AMBULATORY_CARE_PROVIDER_SITE_OTHER): Payer: Managed Care, Other (non HMO) | Admitting: Physician Assistant

## 2020-09-26 DIAGNOSIS — R300 Dysuria: Secondary | ICD-10-CM

## 2020-09-26 DIAGNOSIS — R350 Frequency of micturition: Secondary | ICD-10-CM | POA: Diagnosis not present

## 2020-09-26 MED ORDER — SULFAMETHOXAZOLE-TRIMETHOPRIM 800-160 MG PO TABS: 1.0000 | ORAL_TABLET | Freq: Two times a day (BID) | ORAL | 0 refills | Status: AC

## 2020-09-26 NOTE — Progress Notes (Signed)
09/26/2020 11:18 AM   Debbie Sanchez 07-30-1956 [id number]  CC: Chief Complaint  Patient presents with   PTNS   HPI: Debbie Sanchez is a 65 y.o. female with PMH hematuria with benign work-up in 2019, urinary frequency on PTNS maintenance who failed anticholinergics, and an incidental 3 mm right renal stone who presents today for evaluation of possible UTI and to discuss possible discontinuation of PTNS maintenance.  Today she reports a 3-day history of nocturia, urgency, gross hematuria, and "knife in the bladder" dysuria consistent with past UTIs.  She has not taken any medication at home for management of her symptoms.  She denies fever, chills, nausea, vomiting, and flank pain.  Additionally, she would like to discuss discontinuation of PTNS maintenance, as she has not noticed significant improvement in her daytime frequency and urgency on this therapy.  She does report a decrease in nocturia from 2-3 times at baseline to once on PTNS.  She continues to have daytime frequency as often as every 15 minutes.    She states her urinary frequency and urgency has been stable her whole adult life.  Her mother had similar urinary symptoms and her daughter was diagnosed with VUR in childhood.  She denies a history of back problems or spinal surgeries, but she is prediabetic.  She drinks 1 cup of coffee every morning and eats chocolate regularly, however she notes her urinary symptoms are inconsistent and so she is unsure if these are contributory.  Notably, she wishes to avoid pharmacotherapy.  In-office UA today positive for 2+ blood and trace leukocyte esterase; urine microscopy with 6-10 WBCs/HPF and>30 RBCs/HPF.  PMH: Past Medical History:  Diagnosis Date   Fibrocystic breast disease    rt breast aspiration 2002   Hx of dysplastic nevus 03/11/2018   Upper back right paraspinal   Hx of dysplastic nevus 12/19/2016   R inframammary, mild to moderate   Menopause    last menses 2008     Surgical History: Past Surgical History:  Procedure Laterality Date   BREAST BIOPSY Right 2010   cyst aspiration/US guided biopsy, clip placement   BREAST CYST ASPIRATION Right 2002   cyst aspiration   CESAREAN SECTION  1998   MOUTH SURGERY     TUBAL LIGATION      Home Medications:  Allergies as of 09/26/2020   No Known Allergies     Medication List       Accurate as of September 26, 2020 11:18 AM. If you have any questions, ask your nurse or doctor.        ALPRAZolam 0.25 MG tablet Commonly known as: XANAX TAKE ONE-HALF TABLET BY MOUTH AT BEDTIMEAS NEEDED FOR SLEEP   cholecalciferol 25 MCG (1000 UNIT) tablet Commonly known as: VITAMIN D3 Take 1,000 Units by mouth daily.   MELATONIN ER PO Take by mouth.   multivitamin tablet Take 1 tablet by mouth daily.   TURMERIC PO Take by mouth.   Vitamin B 12 500 MCG Tabs Take by mouth.   vitamin C 100 MG tablet Take 1,000 mg by mouth 2 (two) times daily.   vitamin E 600 UNIT capsule Take 600 Units by mouth daily.   zinc gluconate 50 MG tablet Take 50 mg by mouth daily.       Allergies:  No Known Allergies  Family History: Family History  Problem Relation Age of Onset   Cancer Mother 65       colon CA / resected completely 1999  Cancer Maternal Aunt        breast CA   Breast cancer Maternal Aunt        2 mat great aunts    Social History:   reports that she has never smoked. She has never used smokeless tobacco. She reports current alcohol use. She reports that she does not use drugs.  Physical Exam: There were no vitals taken for this visit.  Constitutional:  Alert and oriented, no acute distress, nontoxic appearing HEENT: [place], AT Cardiovascular: No clubbing, cyanosis, or edema Respiratory: Normal respiratory effort, no increased work of breathing Skin: No rashes, bruises or suspicious lesions Neurologic: Grossly intact, no focal deficits, moving all 4 extremities Psychiatric: Normal  mood and affect  Laboratory Data: Results for orders placed or performed in visit on 09/26/20  Microscopic Examination   Urine  Result Value Ref Range   WBC, UA 6-10 (A) 0 - 5 /hpf   RBC >30 (A) 0 - 2 /hpf   Epithelial Cells (non renal) 0-10 0 - 10 /hpf   Bacteria, UA Few None seen/Few  Urinalysis, Complete  Result Value Ref Range   Specific Gravity, UA 1.010 1.005 - 1.030   pH, UA 5.5 5.0 - 7.5   Color, UA Yellow Yellow   Appearance Ur Cloudy (A) Clear   Leukocytes,UA Trace (A) Negative   Protein,UA Negative Negative/Trace   Glucose, UA Negative Negative   Ketones, UA Negative Negative   RBC, UA 2+ (A) Negative   Bilirubin, UA Negative Negative   Urobilinogen, Ur 0.2 0.2 - 1.0 mg/dL   Nitrite, UA Negative Negative   Microscopic Examination See below:    Assessment & Plan:   1. Urinary frequency Daytime frequency stable on PTNS, nocturia improved.  I counseled the patient that I do not recommend discontinuation of therapy that has given her some therapeutic benefit, especially considering her hesitation to try pharmacotherapy.  We discussed alternative PTNS treatments including intravesical Botox and InterStim.  She may be interested in Botox and I gave her more information today.  I also provided her with a list of bladder irritants and counseled her to start a symptom diary to see if she finds any triggering causes of her daytime symptoms.  We also discussed urodynamics for further evaluation of her bladder function, though I doubt this test would lead to significant changes in the management of her symptoms. - PTNS-Percutaneous Tibial Nerve Stimulati  2. Dysuria Patient clinically infected today, UA notable for hematuria>pyuria.  Will treat with empiric Bactrim and send for culture for further evaluation.  Additionally, would like to get a renal ultrasound to rule out acute stone episode given her known 3 mm right renal stone.  We will plan for repeat UA at her next visit to  prove resolution of MH.  Patient is in agreement with this plan. - Urinalysis, Complete - CULTURE, URINE COMPREHENSIVE - US RENAL; Future - sulfamethoxazole-trimethoprim (BACTRIM DS) 800-160 MG tablet; Take 1 tablet by mouth 2 (two) times daily for 5 days.  Dispense: 10 tablet; Refill: 0   Return in about 4 weeks (around 10/24/2020) for PTNS maintenance.  Debroah Loop, PA-C  [facility] [address], [place], [place] [address] [phone]

## 2020-09-26 NOTE — Patient Instructions (Addendum)
Common bladder irritants include caffeine, alcohol, carbonated beverages, chocolate, spicy foods, and acidic foods including citrus and tomatoes.   Urodynamic Testing  Urodynamic tests are done to determine how well your lower urinary tract is working. The lower urinary tract includes your bladder and the part of your body that drains urine from the bladder (urethra). When your kidneys filter your blood, urine is stored in your bladder until you feel the urge to urinate. Urination requires coordination between the nerves and muscles of your bladder and urethra. When your lower urinary tract is working well, you should be able to:  Start urinating when your bladder is full.  Empty your bladder completely.  Control the flow of your urine. Why do I need urodynamic testing? You may need urodynamic testing to help find the cause of any of these problems:  Leaking urine (incontinence).  Problems starting or stopping your urine flow.  Frequent or painful urination.  Frequent urinary tract infections.  Being unable to empty your bladder completely.  Having strong urges to pass urine (urgency).  Having a weak flow of urine. What are the risks? Generally, these tests are safe. However, some of the tests have risks, including:  Discomfort.  Frequent urge to urinate.  Bleeding.  Infection.  Allergic reactions to medicines or dyes (contrast material). What happens before the test?  Ask your health care provider about changing or stopping your regular medicines. This is especially important if you are taking diabetes medicines or blood thinners.  You may be asked to avoid urinating before coming to the test so that you arrive with a full bladder.  Tell a health care provider about: ? Any allergies you have. ? All medicines you are taking, including vitamins, herbs, eye drops, creams, and over-the-counter medicines. ? Whether you are pregnant or may be pregnant. What happens during  the test? You may have various urodynamic tests. The tests may be done separately or may all be done during one visit. You may be given an antibiotic medicine before or after testing to help prevent infection. The types of tests that may be done include: Uroflowmetry This test measures how much urine you pass and how long it takes to pass.  You will urinate into a certain type of toilet or device (flowmeter).  The device will measure the volume and the time of your urine flow.  These measurements will be sent to a computer that creates a graph of your urine flow. Postvoid residual measurement This test measures how much urine is left in your bladder after you urinate.  The test may be done with ultrasound. In this method, sound waves and a computer will be used to create an image of your bladder.  The test can also be done by inserting a thin, flexible tube (catheter) into your bladder after you urinate. The remaining urine will be removed through the catheter so it can be measured.  Remaining urine will be measured in milliliters (mL). If you have more than 100 mL left in your bladder after you urinate, your bladder is not emptying as it should. Cystometric testing This test uses a type of bladder catheter that can measure pressure.  You may be given a medicine to numb the area (local anesthetic).  The area around the opening of your urethra will be cleaned.  A urinary catheter will be passed through your urethra into your bladder and used to empty your bladder completely.  A measuring catheter will be placed, and your bladder  will be filled with warm, germ-free (sterile) water.  Pressure measurements will be taken: ? As your bladder fills. ? When you feel the need to urinate. ? As your bladder is emptied.  You may be asked to cough or bear down to check for leakage.  In some cases, your bladder may be filled with a material that shows up on X-rays (contrast material) so that  X-ray pictures can be taken during the test. Electromyogram This test measures the electrical activity of the nerves and muscles of your bladder and the opening of your urethra.  Sticky patches (electrodes) will be placed near your rectum and urethra to measure electrical activity.  The measurements will show how well your nerves are communicating with your muscles. What can I expect after the test?  You should be able to go home right away and do your usual activities.  You may be told to drink a glass of water every 30 minutes for the first 2 hours after testing.  Taking a warm bath or using warm, wet cloths (warm compresses) may relieve any discomfort near your urethra. What do the results mean? Talk with your health care provider about what your results mean. Some common causes for abnormal results from urodynamic tests include:  Enlarged prostate in men.  Overactive bladder.  Urinary tract infection.  Nervous system diseases.  Spinal cord damage. Questions to ask your health care provider Ask your health care provider, or the department that is doing the test:  When will my results be ready?  How will I get my results?  What are my treatment options?  What other tests do I need?  What are my next steps? Contact a health care provider if:  You have pain.  You have blood in your urine.  You have chills.  You have a fever. Summary  Urodynamic tests are done to determine how well your lower urinary tract is working. The lower urinary tract includes your bladder and urethra.  You may need urodynamic testing to help find the cause of various problems with urination, such as leaking urine (incontinence) or problems starting or stopping your urine flow.  You may have various urodynamic tests. The tests may be done separately or may all be done during one testing visit.  Talk with your health care provider about what your results mean.  Contact your health care  provider if you have pain, chills, a fever, or blood in your urine. This information is not intended to replace advice given to you by your health care provider. Make sure you discuss any questions you have with your health care provider. Document Revised: 03/11/2020 Document Reviewed: 03/11/2020 Elsevier Patient Education  2021 [place].  Botulinum Toxin Bladder Injection  A botulinum toxin bladder injection is a procedure to treat an overactive bladder. During the procedure, a drug called botulinum toxin is injected into the bladder through a long, thin needle. This drug relaxes the bladder muscles and reduces overactivity. You may need this procedure if your medicines are not working or you cannot take them. The procedure may be repeated as needed. The treatment usually lasts for 6 months. Your health care provider will monitor you to see how well you respond. Tell a health care provider about:  Any allergies you have.  All medicines you are taking, including vitamins, herbs, eye drops, creams, and over-the-counter medicines.  Any problems you or family members have had with anesthetic medicines.  Any blood disorders you have.  Any surgeries  you have had.  Any medical conditions you have.  Any previous reactions to a botulinum toxin injection.  Any symptoms of urinary tract infection. These include chills, fever, a burning feeling when passing urine, and needing to pass urine often.  Whether you are pregnant or may be pregnant. What are the risks? Generally this is a safe procedure. However, problems may occur, including:  Not being able to pass urine. If this happens, you may need to have your bladder emptied with a thin tube inserted into your urethra (urinary catheter).  Bleeding.  Urinary tract infection.  Allergic reaction to the botulinum toxin.  Pain or burning when passing urine.  Damage to other structures or organs. What happens before the  procedure? Staying hydrated Follow instructions from your health care provider about hydration, which may include:  Up to 2 hours before the procedure - you may continue to drink clear liquids, such as water, clear fruit juice, black coffee, and plain tea. Eating and drinking restrictions Follow instructions from your health care provider about eating and drinking, which may include:  8 hours before the procedure - stop eating heavy meals or foods, such as meat, fried foods, or fatty foods.  6 hours before the procedure - stop eating light meals or foods, such as toast or cereal.  6 hours before the procedure - stop drinking milk or drinks that contain milk.  2 hours before the procedure - stop drinking clear liquids. Medicines Ask your health care provider about:  Changing or stopping your regular medicines. This is especially important if you are taking diabetes medicines or blood thinners.  Taking medicines such as aspirin and ibuprofen. These medicines can thin your blood. Do not take these medicines unless your health care provider tells you to take them.  Taking over-the-counter medicines, vitamins, herbs, and supplements. General instructions  Plan to have someone take you home from the hospital or clinic.  If you will be going home right after the procedure, plan to have someone with you for 24 hours.  Ask your health care provider what steps will be taken to help prevent infection. These may include: ? Removing hair at the procedure site. ? Washing skin with a germ-killing soap. ? Antibiotic medicine. What happens during the procedure?  You will be asked to empty your bladder.  An IV will be inserted into one of your veins.  You will be given one or more of the following: ? A medicine to help you relax (sedative). ? A medicine to numb the area (local anesthetic). ? A medicine to make you fall asleep (general anesthetic).  A long, thin scope called a cystoscope  will be passed into your bladder through the part of the body that carries urine from your bladder (urethra).  The cystoscope will be used to fill your bladder with water.  A long needle will be passed through the cystoscope and into the bladder.  The botulinum toxin will be injected into your bladder. It may be injected into multiple areas of your bladder.  Your bladder will be emptied, and the cystoscope will be removed. The procedure may vary among health care providers and hospitals.   What can I expect after procedure? After your procedure, it is common to have:  Blood-tinged urine.  Burning or soreness when you pass urine. Follow these instructions at home: Medicines  Take over-the-counter and prescription medicines only as told by your health care provider.  If you were prescribed an antibiotic medicine, take it  as told by your health care provider. Do not stop taking the antibiotic even if you start to feel better. General instructions  Do not drive for 24 hours if you were given a sedative during your procedure.  Drink enough fluid to keep your urine pale yellow.  Return to your normal activities as told by your health care provider. Ask your health care provider what activities are safe for you.  Keep all follow-up visits as told by your health care provider. This is important.   Contact a health care provider if you have:  A fever or chills.  Blood-tinged urine for more than one day after your procedure.  Worsening pain or burning when you pass urine.  Pain or burning when passing urine for more than two days after your procedure.  Trouble emptying your bladder. Get help right away if you:  Have bright red blood in your urine.  Are unable to pass urine. Summary  A botulinum toxin bladder injection is a procedure to treat an overactive bladder.  This is generally a very safe procedure. However, problems may occur, including not being able to pass urine,  bleeding, infection, pain, and allergic reactions to medicines.  You will be told when to stop eating and drinking, and what medicines to change or stop. Follow instructions carefully.  After the procedure, it is common to have blood in urine and to have soreness or burning when passing urine.  Contact a health care provider if you have a fever, have blood in urine for more than a few days, or have trouble passing urine. Get help right away if you have bright red blood in the urine, or if you are unable to pass urine. This information is not intended to replace advice given to you by your health care provider. Make sure you discuss any questions you have with your health care provider. Document Revised: 02/14/2018 Document Reviewed: 02/14/2018 Elsevier Patient Education  2021 [employer].

## 2020-09-26 NOTE — Progress Notes (Unsigned)
PTNS  Session # monthly  Health & Social Factors: patient is having UTI symptoms today. She states she saw blood once over the weekend and is having dysuria and increased frequency and urgency.  Caffeine: 1 Alcohol: 0 Daytime voids #per day: 13-15 Night-time voids #per night: 2-3 Urgency: moderate Incontinence Episodes #per day: 0-1 Ankle used: right Treatment Setting: 2 Feeling/ Response: both Comments: patient tolerated well  Performed By: Fonnie Jarvis, CMA    Follow Up: patient will be seen by PA today to evaluate urinary symptoms and discuss follow up

## 2020-09-27 LAB — URINALYSIS, COMPLETE
Bilirubin, UA: NEGATIVE
Glucose, UA: NEGATIVE
Ketones, UA: NEGATIVE
Nitrite, UA: NEGATIVE
Protein,UA: NEGATIVE
Specific Gravity, UA: 1.01 (ref 1.005–1.030)
Urobilinogen, Ur: 0.2 mg/dL (ref 0.2–1.0)
pH, UA: 5.5 (ref 5.0–7.5)

## 2020-09-27 LAB — MICROSCOPIC EXAMINATION: RBC, Urine: >30 /hpf — AB (ref 0–2)

## 2020-09-28 LAB — CULTURE, URINE COMPREHENSIVE

## 2020-09-29 ENCOUNTER — Other Ambulatory Visit: Payer: Self-pay | Admitting: Physician Assistant

## 2020-09-29 ENCOUNTER — Telehealth: Payer: Self-pay

## 2020-09-29 MED ORDER — TAMSULOSIN HCL 0.4 MG PO CAPS: 0.4000 mg | ORAL_CAPSULE | Freq: Every day | ORAL | 0 refills | Status: DC

## 2020-09-29 NOTE — Telephone Encounter (Signed)
-----   Message from Debroah Loop, [place] sent at 09/29/2020 10:57 AM EST ----- Please contact the patient and inform her that her urine culture has come back negative for infection. I think she is passing her kidney stone and would like her to start a 40-month course of Flomax to help it pass. She should continue with plans for a renal ultrasound as previously ordered. If she develops severe pain, fever, chills, nausea, or vomiting, will obtain CT stone study for further evaluation. ----- Message ----- From: Interface, [facility] Lab Results In Sent: 09/27/2020   9:37 AM EST To: Debroah Loop, PA-C

## 2020-09-29 NOTE — Telephone Encounter (Signed)
Notified patient as advised. Patient reports RUS is scheduled. Flomax can be sent to [facility]. Advised patient to stop ABX. Patient verbalized understanding.

## 2020-09-29 NOTE — Addendum Note (Signed)
Addended by: Mickle Plumb on: 09/29/2020 02:51 PM   Modules accepted: Orders

## 2020-09-29 NOTE — Telephone Encounter (Signed)
Rx sent to [facility].

## 2020-10-03 ENCOUNTER — Other Ambulatory Visit: Payer: Self-pay

## 2020-10-03 ENCOUNTER — Ambulatory Visit
Admission: RE | Admit: 2020-10-03 | Discharge: 2020-10-03 | Disposition: A | Discharge status: Home / Self Care | Payer: Managed Care, Other (non HMO) | Source: Ambulatory Visit | Attending: Physician Assistant | Admitting: Physician Assistant

## 2020-10-03 DIAGNOSIS — R300 Dysuria: Secondary | ICD-10-CM | POA: Diagnosis present

## 2020-10-06 ENCOUNTER — Telehealth: Payer: Self-pay

## 2020-10-06 ENCOUNTER — Other Ambulatory Visit: Payer: Self-pay

## 2020-10-06 DIAGNOSIS — R3129 Other microscopic hematuria: Secondary | ICD-10-CM

## 2020-10-06 NOTE — Telephone Encounter (Signed)
-----   Message from Debroah Loop, [place] sent at 10/06/2020  8:56 AM EST ----- Please inform the patient that her renal ultrasound is normal and shows that she is not passing her right kidney stone. She may stop Flomax. I understand that her pain has resolved. I would like to plan to recheck her urine at her next PTNS appointment. If her pain or hematuria return, I'd like her to follow up with me sooner. ----- Message ----- From: Interface, Rad Results In Sent: 10/04/2020   2:53 PM EST To: Debroah Loop, PA-C

## 2020-10-06 NOTE — Telephone Encounter (Signed)
Notified patient as advised. Lab appt made for 3/7 directly following her PTNS appt for UA. Patient verbalized understanding.

## 2020-10-24 ENCOUNTER — Ambulatory Visit (INDEPENDENT_AMBULATORY_CARE_PROVIDER_SITE_OTHER): Payer: Managed Care, Other (non HMO)

## 2020-10-24 ENCOUNTER — Other Ambulatory Visit: Payer: Managed Care, Other (non HMO)

## 2020-10-24 ENCOUNTER — Telehealth: Payer: Self-pay | Admitting: Physician Assistant

## 2020-10-24 ENCOUNTER — Other Ambulatory Visit: Payer: Self-pay

## 2020-10-24 DIAGNOSIS — R350 Frequency of micturition: Secondary | ICD-10-CM | POA: Diagnosis not present

## 2020-10-24 NOTE — Progress Notes (Signed)
PTNS  Session # Monthly  Health & Social Factors: no change Caffeine: 1 Alcohol: 0 Daytime voids #per day: 4-5 Night-time voids #per night: 2-3 Urgency: mild Incontinence Episodes #per day: none Ankle used: left Treatment Setting: 6 Feeling/ Response: sensation and toe flex Comments: PT tolerated well  Performed By: Kerman Passey, RMA  Follow Up: 1 month

## 2020-10-24 NOTE — Telephone Encounter (Signed)
Please inform the patient that her repeat UA today looks good.  Microscopic hematuria has resolved.  No further intervention indicated.  She should follow-up as needed aside from her scheduled PTNS treatments.

## 2020-10-24 NOTE — Telephone Encounter (Signed)
Ok per [place], [facility] notifying patient.

## 2020-10-26 LAB — URINALYSIS, COMPLETE
Bilirubin, UA: NEGATIVE
Glucose, UA: NEGATIVE
Ketones, UA: NEGATIVE
Nitrite, UA: NEGATIVE
Protein,UA: NEGATIVE
Specific Gravity, UA: 1.01 (ref 1.005–1.030)
Urobilinogen, Ur: 0.2 mg/dL (ref 0.2–1.0)
pH, UA: 5 (ref 5.0–7.5)

## 2020-10-26 LAB — MICROSCOPIC EXAMINATION: Bacteria, UA: NONE SEEN

## 2020-11-28 ENCOUNTER — Ambulatory Visit (INDEPENDENT_AMBULATORY_CARE_PROVIDER_SITE_OTHER): Payer: Managed Care, Other (non HMO)

## 2020-11-28 ENCOUNTER — Other Ambulatory Visit: Payer: Self-pay

## 2020-11-28 DIAGNOSIS — R350 Frequency of micturition: Secondary | ICD-10-CM | POA: Diagnosis not present

## 2020-11-28 NOTE — Progress Notes (Signed)
PTNS  Session # Monthly  Health & Social Factors: No changes Caffeine: 1 Alcohol: 0 Daytime voids #per day: 10-12 Night-time voids #per night: 1 Urgency: mild Incontinence Episodes #per day: 0 Ankle used: Right Treatment Setting: 3 Feeling/ Response: Toe flex and sensory Comments: Patient tolerated well  Performed By: Kerman Passey, RMA  Follow Up: 1 month

## 2021-01-02 ENCOUNTER — Ambulatory Visit (INDEPENDENT_AMBULATORY_CARE_PROVIDER_SITE_OTHER): Payer: Managed Care, Other (non HMO)

## 2021-01-02 ENCOUNTER — Other Ambulatory Visit: Payer: Self-pay

## 2021-01-02 ENCOUNTER — Encounter: Payer: Self-pay | Admitting: Dermatology

## 2021-01-02 ENCOUNTER — Ambulatory Visit: Payer: Managed Care, Other (non HMO) | Admitting: Dermatology

## 2021-01-02 DIAGNOSIS — L578 Other skin changes due to chronic exposure to nonionizing radiation: Secondary | ICD-10-CM | POA: Diagnosis not present

## 2021-01-02 DIAGNOSIS — D2272 Melanocytic nevi of left lower limb, including hip: Secondary | ICD-10-CM

## 2021-01-02 DIAGNOSIS — L237 Allergic contact dermatitis due to plants, except food: Secondary | ICD-10-CM | POA: Diagnosis not present

## 2021-01-02 DIAGNOSIS — D1800 Hemangioma unspecified site: Secondary | ICD-10-CM

## 2021-01-02 DIAGNOSIS — R350 Frequency of micturition: Secondary | ICD-10-CM

## 2021-01-02 DIAGNOSIS — Z86018 Personal history of other benign neoplasm: Secondary | ICD-10-CM

## 2021-01-02 DIAGNOSIS — Z1283 Encounter for screening for malignant neoplasm of skin: Secondary | ICD-10-CM | POA: Diagnosis not present

## 2021-01-02 DIAGNOSIS — L814 Other melanin hyperpigmentation: Secondary | ICD-10-CM

## 2021-01-02 DIAGNOSIS — L821 Other seborrheic keratosis: Secondary | ICD-10-CM

## 2021-01-02 DIAGNOSIS — D229 Melanocytic nevi, unspecified: Secondary | ICD-10-CM

## 2021-01-02 MED ORDER — CLOBETASOL PROP EMOLLIENT BASE 0.05 % EX CREA: TOPICAL_CREAM | CUTANEOUS | 1 refills | Status: DC

## 2021-01-02 NOTE — Patient Instructions (Signed)
Tracking Your Bladder Symptoms    Patient Name:___________________________________________________   Sample: Day   Daytime Voids  Nighttime Voids Urgency for the Day(0-4) Number of Accidents Beverage Comments  Monday IIII II 2 I Water IIII Coffee  I      Week Starting:____________________________________   Day Daytime  Voids Nighttime  Voids Urgency for  The Day(0-4) Number of Accidents Beverages Comments                                                           This week my symptoms were:  O much better  O better O the same O worse   

## 2021-01-02 NOTE — Progress Notes (Signed)
PTNS  Session # Monthly Maintenance   Health & Social Factors: No Change  Caffeine: 1 Alcohol: 0 Daytime voids #per day: 10 Night-time voids #per night: 2 Urgency: Moderate Incontinence Episodes #per day: 0-1 Ankle used: Right Treatment Setting: 2 Feeling/ Response: Sensory & Toe Flex  Comments: N/A  Performed By: Gordy Clement, CMA   Follow Up: RTC in 1 week

## 2021-01-02 NOTE — Progress Notes (Signed)
Follow-Up Visit   Subjective  Debbie Sanchez is a 65 y.o. female who presents for the following: Annual Exam (Mole check ). Hx of Dysplastic nevus. Pt c/o she was out working in the yard a few days ago,now she have poison ivy. The patient presents for Total-Body Skin Exam (TBSE) for skin cancer screening and mole check.  The following portions of the chart were reviewed this encounter and updated as appropriate:   Tobacco  Allergies  Meds  Problems  Med Hx  Surg Hx  Fam Hx     Review of Systems:  No other skin or systemic complaints except as noted in HPI or Assessment and Plan.  Objective  Well appearing patient in no apparent distress; mood and affect are within normal limits.  A full examination was performed including scalp, head, eyes, ears, nose, lips, neck, chest, axillae, abdomen, back, buttocks, bilateral upper extremities, bilateral lower extremities, hands, feet, fingers, toes, fingernails, and toenails. All findings within normal limits unless otherwise noted below.  Objective  arms: Linear vesicles   Objective  Left Plantar Surface of Heel: 0.3 light brown macule   Images       Assessment & Plan  Poison ivy Contac Dermatitis arms Start Clobetasol .05% cream apply to skin qd-bid prn Avoid applying to face, groin, and axilla. Use as directed. Risk of skin atrophy with long-term use reviewed.    Topical steroids (such as triamcinolone, fluocinolone, fluocinonide, mometasone, clobetasol, halobetasol, betamethasone, hydrocortisone) can cause thinning and lightening of the skin if they are used for too long in the same area. Your physician has selected the right strength medicine for your problem and area affected on the body. Please use your medication only as directed by your physician to prevent side effects.    Reordered Medications Clobetasol Prop Emollient Base (CLOBETASOL PROPIONATE E) 0.05 % emollient cream  Nevus Left Plantar Surface of  Heel Benign-appearing.  Observation.  Call clinic for new or changing lesions.  Recommend daily use of broad spectrum spf 30+ sunscreen to sun-exposed areas.  Observe    Lentigines - Scattered tan macules - Due to sun exposure - Benign-appering, observe - Recommend daily broad spectrum sunscreen SPF 30+ to sun-exposed areas, reapply every 2 hours as needed. - Call for any changes  Seborrheic Keratoses - Stuck-on, waxy, tan-brown papules and/or plaques  - Benign-appearing - Discussed benign etiology and prognosis. - Observe - Call for any changes  Melanocytic Nevi - Tan-brown and/or pink-flesh-colored symmetric macules and papules - Benign appearing on exam today - Observation - Call clinic for new or changing moles - Recommend daily use of broad spectrum spf 30+ sunscreen to sun-exposed areas.   Hemangiomas - Red papules - Discussed benign nature - Observe - Call for any changes  Actinic Damage - Chronic condition, secondary to cumulative UV/sun exposure - diffuse scaly erythematous macules with underlying dyspigmentation - Recommend daily broad spectrum sunscreen SPF 30+ to sun-exposed areas, reapply every 2 hours as needed.  - Staying in the shade or wearing long sleeves, sun glasses (UVA+UVB protection) and wide brim hats (4-inch brim around the entire circumference of the hat) are also recommended for sun protection.  - Call for new or changing lesions.  History of Dysplastic Nevi Multiple see history  - No evidence of recurrence today - Recommend regular full body skin exams - Recommend daily broad spectrum sunscreen SPF 30+ to sun-exposed areas, reapply every 2 hours as needed.  - Call if any new or changing lesions  are noted between office visits  Skin cancer screening performed today.  Return in about 6 months (around 07/05/2021) for recheck left foot nevus .  IMarye Round, CMA, am acting as scribe for Sarina Ser, MD .  Documentation: I have  reviewed the above documentation for accuracy and completeness, and I agree with the above.  Sarina Ser, MD

## 2021-01-02 NOTE — Patient Instructions (Addendum)

## 2021-01-10 ENCOUNTER — Encounter: Payer: Self-pay | Admitting: Dermatology

## 2021-02-07 ENCOUNTER — Ambulatory Visit (INDEPENDENT_AMBULATORY_CARE_PROVIDER_SITE_OTHER): Payer: Managed Care, Other (non HMO)

## 2021-02-07 ENCOUNTER — Other Ambulatory Visit: Payer: Self-pay

## 2021-02-07 DIAGNOSIS — R350 Frequency of micturition: Secondary | ICD-10-CM

## 2021-02-07 NOTE — Progress Notes (Signed)
PTNS  Session # Monthly maintenance   Health & Social Factors: No Change Caffeine: 1 Alcohol: 0 Daytime voids #per day: 4-5 Night-time voids #per night: 1-2 Urgency:Mild Incontinence Episodes #per day: 0 Ankle used: Left Treatment Setting: 3 Feeling/ Response: Sensory & Toe Flex  Comments: N/A  Performed By: Gordy Clement, CMA   Follow Up: RTC in 1 month for PTNS

## 2021-02-13 ENCOUNTER — Ambulatory Visit (INDEPENDENT_AMBULATORY_CARE_PROVIDER_SITE_OTHER): Payer: Managed Care, Other (non HMO) | Admitting: Physician Assistant

## 2021-02-13 ENCOUNTER — Telehealth: Payer: Self-pay | Admitting: Physician Assistant

## 2021-02-13 ENCOUNTER — Other Ambulatory Visit: Payer: Self-pay

## 2021-02-13 VITALS — BP 105/67 | HR 84 | Ht 60.0 in | Wt 103.0 lb

## 2021-02-13 DIAGNOSIS — R300 Dysuria: Secondary | ICD-10-CM

## 2021-02-13 MED ORDER — SULFAMETHOXAZOLE-TRIMETHOPRIM 800-160 MG PO TABS: 1.0000 | ORAL_TABLET | Freq: Two times a day (BID) | ORAL | 0 refills | Status: AC

## 2021-02-13 NOTE — Progress Notes (Signed)
02/13/2021 5:20 PM   ERYKAH LIPPERT 1955/11/04 [id number]  CC: Chief Complaint  Patient presents with   Dysuria    HPI: Debbie Sanchez is a 65 y.o. female with PMH hematuria with benign work-up in 2019, urinary frequency on PTNS maintenance who failed anticholinergics, and an incidental 3 mm right renal stone who presents today for evaluation of possible UTI.  Today she reports an approximate 3-day history of dysuria, urgency, and frequency.  She denies fever, chills, nausea, vomiting, and flank pain.  She took Azo most recently yesterday for management of her symptoms.  She states today that she wishes to drop off urine samples in the future for evaluation of acute cystitis, as she feels there is limited benefit to be gained from a clinic visit in these situations.  In-office UA today positive for orange color, 2+ blood, 1+ protein, nitrites, and 1+ leukocyte esterase; urine microscopy with >30 WBCs/HPF and 11-30 RBCs/HPF.  PMH: Past Medical History:  Diagnosis Date   Fibrocystic breast disease    rt breast aspiration 2002   Hx of dysplastic nevus 03/11/2018   Upper back right paraspinal   Hx of dysplastic nevus 12/19/2016   R inframammary, mild to moderate   Menopause    last menses 2008    Surgical History: Past Surgical History:  Procedure Laterality Date   BREAST BIOPSY Right 2010   cyst aspiration/US guided biopsy, clip placement   BREAST CYST ASPIRATION Right 2002   cyst aspiration   CESAREAN SECTION  1998   MOUTH SURGERY     TUBAL LIGATION      Home Medications:  Allergies as of 02/13/2021   No Known Allergies      Medication List        Accurate as of February 13, 2021  5:20 PM. If you have any questions, ask your nurse or doctor.          ALPRAZolam 0.25 MG tablet Commonly known as: XANAX TAKE ONE-HALF TABLET BY MOUTH AT BEDTIMEAS NEEDED FOR SLEEP   cholecalciferol 25 MCG (1000 UNIT) tablet Commonly known as: VITAMIN D3 Take 1,000 Units by  mouth daily.   Clobetasol Prop Emollient Base 0.05 % emollient cream Commonly known as: Clobetasol Propionate E Apply to irritated skin qd-bid prn Avoid applying to face, groin, and axilla. Use as directed. Risk of skin atrophy with long-term use reviewed.   MELATONIN ER PO Take by mouth.   multivitamin tablet Take 1 tablet by mouth daily.   sulfamethoxazole-trimethoprim 800-160 MG tablet Commonly known as: BACTRIM DS Take 1 tablet by mouth 2 (two) times daily for 5 days. Started by: Debroah Loop, PA-C   tamsulosin 0.4 MG Caps capsule Commonly known as: FLOMAX Take 1 capsule (0.4 mg total) by mouth daily.   TURMERIC PO Take by mouth.   Vitamin B 12 500 MCG Tabs Take by mouth.   vitamin C 100 MG tablet Take 1,000 mg by mouth 2 (two) times daily.   vitamin E 600 UNIT capsule Take 600 Units by mouth daily.   zinc gluconate 50 MG tablet Take 50 mg by mouth daily.        Allergies:  No Known Allergies  Family History: Family History  Problem Relation Age of Onset   Cancer Mother 53       colon CA / resected completely 1999   Cancer Maternal Aunt        breast CA   Breast cancer Maternal Aunt  2 mat great aunts    Social History:   reports that she has never smoked. She has never used smokeless tobacco. She reports current alcohol use. She reports that she does not use drugs.  Physical Exam: BP 105/67   Pulse 84   Ht 5' (1.524 m)   Wt 103 lb (46.7 kg)   BMI 20.12 kg/m   Constitutional:  Alert and oriented, no acute distress, nontoxic appearing HEENT: [place], AT Cardiovascular: No clubbing, cyanosis, or edema Respiratory: Normal respiratory effort, no increased work of breathing Skin: No rashes, bruises or suspicious lesions Neurologic: Grossly intact, no focal deficits, moving all 4 extremities Psychiatric: Normal mood and affect  Laboratory Data: Results for orders placed or performed in visit on 02/13/21  Microscopic Examination    Urine  Result Value Ref Range   WBC, UA >30 (A) 0 - 5 /hpf   RBC 11-30 (A) 0 - 2 /hpf   Epithelial Cells (non renal) 0-10 0 - 10 /hpf   Bacteria, UA Few None seen/Few  Urinalysis, Complete  Result Value Ref Range   Specific Gravity, UA >1.030 (H) 1.005 - 1.030   pH, UA 5.0 5.0 - 7.5   Color, UA Orange Yellow   Appearance Ur Cloudy (A) Clear   Leukocytes,UA 1+ (A) Negative   Protein,UA 1+ (A) Negative/Trace   Glucose, UA Negative Negative   Ketones, UA Negative Negative   RBC, UA 2+ (A) Negative   Bilirubin, UA Negative Negative   Urobilinogen, Ur 0.2 0.2 - 1.0 mg/dL   Nitrite, UA Positive (A) Negative   Microscopic Examination See below:    Assessment & Plan:   1. Dysuria UA is infected appearing today consistent with acute cystitis.  Will treat with empiric Bactrim and send for culture for further evaluation.  No need to repeat UA to prove resolution of microscopic hematuria with her known history of hematuria with benign work-up in 2019.  I explained to the patient that unfortunately our clinic policy is to see patients for clinic visits with acute cystitis.  I explained that we do not allow urine drop-offs except for in very rare circumstances, and it is important that I see my patients to rule out more serious underlying problems, especially with her known history of nephrolithiasis.  Patient states she will reevaluate whether she wishes to come to our clinic for evaluation when she is having symptoms in the future. - Urinalysis, Complete - CULTURE, URINE COMPREHENSIVE - sulfamethoxazole-trimethoprim (BACTRIM DS) 800-160 MG tablet; Take 1 tablet by mouth 2 (two) times daily for 5 days.  Dispense: 10 tablet; Refill: 0  Return if symptoms worsen or fail to improve.  Debroah Loop, PA-C  [facility] [address], [place], [place] [address] [phone]

## 2021-02-13 NOTE — Telephone Encounter (Signed)
Patient has had symptoms of UTI x3 days.   She is requesting an appointment today.   Appointment added.

## 2021-02-14 LAB — URINALYSIS, COMPLETE
Bilirubin, UA: NEGATIVE
Glucose, UA: NEGATIVE
Ketones, UA: NEGATIVE
Nitrite, UA: POSITIVE — AB
Specific Gravity, UA: >1.03 — ABNORMAL HIGH (ref 1.005–1.030)
Urobilinogen, Ur: 0.2 mg/dL (ref 0.2–1.0)
pH, UA: 5 (ref 5.0–7.5)

## 2021-02-14 LAB — MICROSCOPIC EXAMINATION: WBC, UA: >30 /hpf — AB (ref 0–5)

## 2021-02-16 LAB — CULTURE, URINE COMPREHENSIVE

## 2021-03-10 ENCOUNTER — Other Ambulatory Visit: Payer: Self-pay

## 2021-03-10 ENCOUNTER — Ambulatory Visit (INDEPENDENT_AMBULATORY_CARE_PROVIDER_SITE_OTHER): Payer: Managed Care, Other (non HMO) | Admitting: *Deleted

## 2021-03-10 ENCOUNTER — Ambulatory Visit: Payer: Self-pay | Admitting: Physician Assistant

## 2021-03-10 DIAGNOSIS — R350 Frequency of micturition: Secondary | ICD-10-CM

## 2021-03-10 NOTE — Progress Notes (Signed)
Patient ID: Debbie Sanchez, female   DOB: 29-Apr-1956, 65 y.o.   MRN: [id number] PTNS  Session # Monthly     Health & Social Factors: no change Caffeine: 1 Alcohol: 0 Daytime voids #per day: 8-10 Night-time voids #per night: 1-2 Urgency: mild Incontinence Episodes #per day: 0 Ankle used: left Treatment Setting: 5 Feeling/ Response: sensory Comments: pt tolerated well  Performed By: Edwin Dada, CMA  Follow Up: 86mh for PTNS

## 2021-04-14 ENCOUNTER — Ambulatory Visit (INDEPENDENT_AMBULATORY_CARE_PROVIDER_SITE_OTHER): Payer: Managed Care, Other (non HMO)

## 2021-04-14 ENCOUNTER — Other Ambulatory Visit: Payer: Self-pay

## 2021-04-14 DIAGNOSIS — R350 Frequency of micturition: Secondary | ICD-10-CM

## 2021-04-14 NOTE — Progress Notes (Signed)
PTNS  Session # Monthly 19   Health & Social Factors: no changes Caffeine: 1 Alcohol: 0 Daytime voids #per day: 6-7. 10 times yesterday Night-time voids #per night: 1-2 Urgency: mild Incontinence Episodes #per day: 0 Ankle used: righ Treatment Setting: 3 Feeling/ Response: sensory and toe flex Comments: pt tolerated well  Performed By: Kerman Passey  Follow Up: 1 month maintenence

## 2021-05-07 ENCOUNTER — Other Ambulatory Visit: Payer: Self-pay | Admitting: Internal Medicine

## 2021-05-19 ENCOUNTER — Other Ambulatory Visit: Payer: Self-pay

## 2021-05-19 ENCOUNTER — Ambulatory Visit (INDEPENDENT_AMBULATORY_CARE_PROVIDER_SITE_OTHER): Payer: Medicare HMO | Admitting: *Deleted

## 2021-05-19 DIAGNOSIS — R350 Frequency of micturition: Secondary | ICD-10-CM

## 2021-05-19 NOTE — Patient Instructions (Signed)
Tracking Your Bladder Symptoms    Patient Name:___________________________________________________   Sample: Day   Daytime Voids  Nighttime Voids Urgency for the Day(0-4) Number of Accidents Beverage Comments  Monday IIII II 2 I Water IIII Coffee  I      Week Starting:____________________________________   Day Daytime  Voids Nighttime  Voids Urgency for  The Day(0-4) Number of Accidents Beverages Comments                                                           This week my symptoms were:  O much better  O better O the same O worse   

## 2021-05-19 NOTE — Progress Notes (Signed)
Patient ID: Debbie Sanchez, female   DOB: [birth year], 65 y.o.   MRN: [id number] PTNS  Session # 20 of 45  Health & Social Factors: no change Caffeine: 1 Alcohol: 0 Daytime voids #per day: 8-10 Night-time voids #per night: 1-2 Urgency: mild Incontinence Episodes #per day: 0 Ankle used: right Treatment Setting: 11 Feeling/ Response: both Comments: pt tolerated well  Performed By: Edwin Dada, CMA    Follow Up: 21 of 45

## 2021-06-16 ENCOUNTER — Other Ambulatory Visit: Payer: Self-pay

## 2021-06-16 ENCOUNTER — Ambulatory Visit (INDEPENDENT_AMBULATORY_CARE_PROVIDER_SITE_OTHER): Payer: Medicare HMO | Admitting: Internal Medicine

## 2021-06-16 ENCOUNTER — Encounter: Payer: Self-pay | Admitting: Internal Medicine

## 2021-06-16 VITALS — BP 110/74 | HR 74 | Temp 97.0°F | Ht 60.0 in | Wt 103.1 lb

## 2021-06-16 DIAGNOSIS — R5383 Other fatigue: Secondary | ICD-10-CM | POA: Diagnosis not present

## 2021-06-16 DIAGNOSIS — N390 Urinary tract infection, site not specified: Secondary | ICD-10-CM

## 2021-06-16 DIAGNOSIS — Z13820 Encounter for screening for osteoporosis: Secondary | ICD-10-CM | POA: Diagnosis not present

## 2021-06-16 DIAGNOSIS — M79641 Pain in right hand: Secondary | ICD-10-CM

## 2021-06-16 DIAGNOSIS — G250 Essential tremor: Secondary | ICD-10-CM | POA: Diagnosis not present

## 2021-06-16 DIAGNOSIS — Z1231 Encounter for screening mammogram for malignant neoplasm of breast: Secondary | ICD-10-CM

## 2021-06-16 DIAGNOSIS — Z23 Encounter for immunization: Secondary | ICD-10-CM

## 2021-06-16 DIAGNOSIS — Z0000 Encounter for general adult medical examination without abnormal findings: Secondary | ICD-10-CM

## 2021-06-16 DIAGNOSIS — J309 Allergic rhinitis, unspecified: Secondary | ICD-10-CM | POA: Diagnosis not present

## 2021-06-16 DIAGNOSIS — M79642 Pain in left hand: Secondary | ICD-10-CM | POA: Diagnosis not present

## 2021-06-16 DIAGNOSIS — Z780 Asymptomatic menopausal state: Secondary | ICD-10-CM

## 2021-06-16 LAB — CBC WITH DIFFERENTIAL/PLATELET
Basophils Absolute: 0 10*3/uL (ref 0.0–0.1)
Basophils Relative: 0.4 % (ref 0.0–3.0)
Eosinophils Absolute: 0.1 10*3/uL (ref 0.0–0.7)
Eosinophils Relative: 1.5 % (ref 0.0–5.0)
HCT: 43.2 % (ref 36.0–46.0)
Hemoglobin: 14.3 g/dL (ref 12.0–15.0)
Lymphocytes Relative: 25.6 % (ref 12.0–46.0)
Lymphs Abs: 1.5 10*3/uL (ref 0.7–4.0)
MCHC: 33.2 g/dL (ref 30.0–36.0)
MCV: 90.7 fl (ref 78.0–100.0)
Monocytes Absolute: 0.5 10*3/uL (ref 0.1–1.0)
Monocytes Relative: 8.7 % (ref 3.0–12.0)
Neutro Abs: 3.7 10*3/uL (ref 1.4–7.7)
Neutrophils Relative %: 63.8 % (ref 43.0–77.0)
Platelets: 251 10*3/uL (ref 150.0–400.0)
RBC: 4.77 Mil/uL (ref 3.87–5.11)
RDW: 12.4 % (ref 11.5–15.5)
WBC: 5.7 10*3/uL (ref 4.0–10.5)

## 2021-06-16 LAB — COMPREHENSIVE METABOLIC PANEL
ALT: 18 U/L (ref 0–35)
AST: 18 U/L (ref 0–37)
Albumin: 4.3 g/dL (ref 3.5–5.2)
Alkaline Phosphatase: 77 U/L (ref 39–117)
BUN: 18 mg/dL (ref 6–23)
CO2: 32 mEq/L (ref 19–32)
Calcium: 9.6 mg/dL (ref 8.4–10.5)
Chloride: 102 mEq/L (ref 96–112)
Creatinine, Ser: 0.61 mg/dL (ref 0.40–1.20)
GFR: 94.03 mL/min (ref >60.00)
Glucose, Bld: 101 mg/dL — ABNORMAL HIGH (ref 70–99)
Potassium: 4.4 mEq/L (ref 3.5–5.1)
Sodium: 140 mEq/L (ref 135–145)
Total Bilirubin: 0.6 mg/dL (ref 0.2–1.2)
Total Protein: 7.1 g/dL (ref 6.0–8.3)

## 2021-06-16 LAB — TSH: TSH: 1.19 u[IU]/mL (ref 0.35–5.50)

## 2021-06-16 MED ORDER — FLUTICASONE PROPIONATE 50 MCG/ACT NA SUSP: 2.0000 | Freq: Every day | NASAL | 6 refills | Status: DC

## 2021-06-16 MED ORDER — TETANUS-DIPHTH-ACELL PERTUSSIS 5-2.5-18.5 LF-MCG/0.5 IM SUSY: 0.5000 mL | PREFILLED_SYRINGE | Freq: Once | INTRAMUSCULAR | 0 refills | Status: AC

## 2021-06-16 NOTE — Progress Notes (Signed)
The patient is here for the Welcome to  Medicare  preventive visit     has a past medical history of Fibrocystic breast disease, dysplastic nevus (03/11/2018), dysplastic nevus (12/19/2016), and Menopause.    reports that she has never smoked. She has never used smokeless tobacco. She reports current alcohol use. She reports that she does not use drugs.   The roster of all physicians providing medical care to patient : Patient Care Team: Crecencio Mc, MD as PCP - General (Internal Medicine) Crecencio Mc, MD (Internal Medicine)  Activities of daily living:  The patient is 100% independent in all ADLs: dressing, toileting, feeding as well as independent mobility Fall risk was assessed by direct patient evaluation of patient's balance, gait and ability to risk from a chair and from a kneeling position. Home safety : The patient has smoke detectors in the home. They wear seatbelts.  There are no firearms at home. There is no violence in the home.  Patient has seen their eye doctor in the last year.   Visual acuity was assessed today  and was 20/20 with correction lenses. Patient denies hearing difficulty with regard to whispered voices and some television programs and has  deferred audiologic testing in the last year.   There is no risks for hepatitis, STDs or HIV. There is no   history of blood transfusion. They have no travel history to infectious disease endemic areas of the world.  The patient has seen their dentist in the last six month.  They do not  have excessive sun exposure. Discussed the need for sun protection: hats, long sleeves and use of sunscreen if there is significant sun exposure.   Diet: the importance of a healthy diet is discussed. They do have a healthy diet.  The benefits of regular aerobic exercise were discussed. Patient walks 4 times per week ,  a minimum of 20 minutes.   Depression screen:   Depression screen [facility] 2/9 06/16/2021 06/16/2020 06/15/2019 06/07/2017   Decreased Interest 0 0 1 0  Down, Depressed, Hopeless 0 0 1 0  PHQ - 2 Score 0 0 2 0  Altered sleeping - 1 1 0  Tired, decreased energy - 1 1 1   Change in appetite - 0 0 0  Feeling bad or failure about yourself  - 0 0 0  Trouble concentrating - 0 1 0  Moving slowly or fidgety/restless - 0 0 0  Suicidal thoughts - 0 0 0  PHQ-9 Score - 2 5 1   Difficult doing work/chores - Not difficult at all Somewhat difficult Not difficult at all       Cognitive assessment: the patient manages all their financial and personal affairs and is actively engaged. They could relate day,date,year and events; recalled 2/3 objects at 3 minutes; performed clock-face test normally.  The following portions of the patient's history were reviewed and updated as appropriate: allergies, current medications, past family history, past medical history,  past surgical history, past social history  and problem list.  During the course of the visit the patient was educated and counseled about appropriate screening and preventive services including : fall prevention , diabetes screening, nutrition counseling, colorectal cancer screening, and recommended immunizations   Immunization History  Administered Date(s) Administered   Fluad Quad(high Dose 65+) 06/16/2021   Influenza,inj,Quad PF,6+ Mos 06/15/2019, 06/16/2020   Influenza-Unspecified 05/20/2012, 06/13/2016, 05/29/2017, 06/05/2018   PFIZER(Purple Top)SARS-COV-2 Vaccination 11/24/2019, 12/15/2019   PNEUMOCOCCAL CONJUGATE-20 06/16/2021   Pfizer Covid-19 Office manager  55yrs & up 05/15/2021   Tdap 03/03/2010   Zoster Recombinat (Shingrix) 09/19/2020, 01/12/2021  HMLISTPATIENTFRIENDLY@ Health Maintenance Due  Topic Date Due   TETANUS/TDAP  03/03/2020   DEXA SCAN  Never done    Last skin cancer screening :   History :  1) Frequent PND  with  remote allergy testing and ENT evaluation  ,  minimal  reflux diagnosed but not treated.  Currently having  frequent throat clearing due to PND , and she reports recurrent inflammation in the inside of her left nostril on the septal side.   2) recurrent episodes  of Bilateral pain in hands affecting  the flexor tendons of all fingers,  not the joints.  Will be tender to the touch. 2 episodes of entire hand swelling and painful . The last episode occurred in April.  Both occurred after a UTI was treated with cipro and anothe r unnamed antibiotic. She is a Brewing technologist   3) had symptoms of UTI in June and was denied the opportunity to drop off a urine   Vital Signs: BP 110/74 (BP Location: Left Arm, Patient Position: Sitting, Cuff Size: Normal)   Pulse 74   Temp (!) 97 F (36.1 C) (Temporal)   Ht 5' (1.524 m)   Wt 103 lb 1.9 oz (46.8 kg)   SpO2 99%   BMI 20.14 kg/m    Exam: General appearance: alert, cooperative and appears stated age Head: Normocephalic, without obvious abnormality, atraumatic Eyes: conjunctivae/corneas clear. PERRL, EOM's intact. Fundi benign. Ears: normal TM's and external ear canals both ears Nose: Nares normal. Septum midline. Mucosa normal. No drainage or sinus tenderness. Throat: lips, mucosa, and tongue normal; teeth and gums normal Neck: no adenopathy, no carotid bruit, no JVD, supple, symmetrical, trachea midline and thyroid not enlarged, symmetric, no tenderness/mass/nodules Lungs: clear to auscultation bilaterally Breasts: normal appearance, no masses or tenderness Heart: regular rate and rhythm, S1, S2 normal, no murmur, click, rub or gallop Abdomen: soft, non-tender; bowel sounds normal; no masses,  no organomegaly Extremities: extremities normal, atraumatic, no cyanosis or edema Pulses: 2+ and symmetric Skin: Skin color, texture, turgor normal. No rashes or lesions Neurologic: Alert and oriented X 3, normal strength and tone. Normal symmetric reflexes. Normal coordination and gait.     End of Life Discussion and Planning   During the course of the visit , End of  Life objectives were discussed at length.  Patient does not have a living will in place or a healthcare power of attorney.  Patient  was given printed information about advance directives and encouraged to return after discussing with their family.  Review of Opioid Prescriptions    Patient does not have a current opioid prescription Patient's risk factors for opioid use disorder was reviewed and includes nothing.  Treatment of pain using non-opioid alternatives was reviewed  and encouraged   Patient risk for potential substance abuse was assessed and addressed with counselling.    Assessment and Plan:  Welcome to Medicare preventive visit age appropriate education and counseling updated, referrals for preventative services and immunizations addressed, dietary and smoking counseling addressed, most recent labs reviewed.  I have personally reviewed and have noted:   1) the patient's medical and social history 2) The pt's use of alcohol, tobacco, and illicit drugs 3) The patient's current medications and supplements 4) Functional ability including ADL's, fall risk, home safety risk, hearing and visual impairment 5) Diet and physical activities 6) Evidence for depression or mood disorder 7) The patient's  height, weight, and BMI have been recorded in the chart   I have made referrals, and provided counseling and education based on review of the above  Benign essential tremor Her tremor improves with use of hand .   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    cholecalciferol (VITAMIN D3) 25 MCG (1000 UT) tablet Take 1,000 Units by mouth daily.   Cyanocobalamin (VITAMIN B 12) 500 MCG TABS Take by mouth.   fluticasone (FLONASE) 50 MCG/ACT nasal spray Place 2 sprays into both nostrils daily.   MELATONIN ER PO Take by mouth.   Multiple Vitamin (MULTIVITAMIN) tablet Take 1 tablet by mouth daily.   [EXPIRED] Tdap (BOOSTRIX) 5-2.5-18.5 LF-MCG/0.5 injection Inject 0.5 mLs into the muscle once for 1 dose.   TURMERIC PO Take by mouth.   vitamin E 600 UNIT capsule Take 600 Units by mouth daily.   zinc gluconate 50 MG tablet Take 50 mg by mouth daily.   Clobetasol Prop Emollient Base (CLOBETASOL PROPIONATE E) 0.05 % emollient cream Apply to irritated skin qd-bid prn Avoid applying to face, groin, and axilla. Use as directed. Risk of skin atrophy with long-term use reviewed. (Patient not taking: Reported on 06/16/2021)   tamsulosin (FLOMAX) 0.4 MG CAPS capsule Take 1 capsule (0.4 mg total) by mouth daily. (Patient not taking: Reported on 06/16/2021)   No facility-administered encounter medications on file as of 06/16/2021.

## 2021-06-18 DIAGNOSIS — N390 Urinary tract infection, site not specified: Secondary | ICD-10-CM | POA: Insufficient documentation

## 2021-06-18 NOTE — Assessment & Plan Note (Signed)
Her tremor improves with use of hand .

## 2021-06-18 NOTE — Assessment & Plan Note (Signed)
Patient advised to schedule a lab visit for the next occurrene , and a follow up visit with me

## 2021-06-18 NOTE — Assessment & Plan Note (Signed)

## 2021-06-18 NOTE — Assessment & Plan Note (Signed)
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

## 2021-06-19 ENCOUNTER — Ambulatory Visit (INDEPENDENT_AMBULATORY_CARE_PROVIDER_SITE_OTHER): Payer: Medicare HMO | Admitting: Physician Assistant

## 2021-06-19 ENCOUNTER — Other Ambulatory Visit: Payer: Self-pay

## 2021-06-19 DIAGNOSIS — R350 Frequency of micturition: Secondary | ICD-10-CM | POA: Diagnosis not present

## 2021-06-19 NOTE — Progress Notes (Signed)
PTNS  Session # Monthly Maintenance 21/45  Health & Social Factors: Retied now Caffeine: 1 Alcohol: None Daytime voids #per day: 8 Night-time voids #per night: 1 Urgency: Mild Incontinence Episodes #per day: 0 Ankle used: Left  Treatment Setting: 3 Feeling/ Response: Sensory Comments: Pt states she is unsure if maintenance treatments are helping, states she will complete a few more treatments and then possibly discontinue based on symptoms.   Performed By: Gordy Clement, CMA   Follow Up: RTC in 1 month

## 2021-07-05 ENCOUNTER — Other Ambulatory Visit: Payer: Self-pay

## 2021-07-05 ENCOUNTER — Ambulatory Visit: Payer: Medicare HMO | Admitting: Dermatology

## 2021-07-05 DIAGNOSIS — D2272 Melanocytic nevi of left lower limb, including hip: Secondary | ICD-10-CM

## 2021-07-05 DIAGNOSIS — D485 Neoplasm of uncertain behavior of skin: Secondary | ICD-10-CM

## 2021-07-05 NOTE — Progress Notes (Signed)
   Follow-Up Visit   Subjective  Debbie Sanchez is a 65 y.o. female who presents for the following: recheck nevus (Of the L plantar surface ).  The following portions of the chart were reviewed this encounter and updated as appropriate:   Tobacco  Allergies  Meds  Problems  Med Hx  Surg Hx  Fam Hx     Review of Systems:  No other skin or systemic complaints except as noted in HPI or Assessment and Plan.  Objective  Well appearing patient in no apparent distress; mood and affect are within normal limits.  A focused examination was performed including the L foot. Relevant physical exam findings are noted in the Assessment and Plan.  L plantar surface of heel 0.4 cm light brown macule.    Assessment & Plan  Neoplasm of uncertain behavior of skin L plantar surface of heel  Epidermal / dermal shaving  Lesion diameter (cm):  0.4 Informed consent: discussed and consent obtained   Timeout: patient name, date of birth, surgical site, and procedure verified   Procedure prep:  Patient was prepped and draped in usual sterile fashion Prep type:  Isopropyl alcohol Anesthesia: the lesion was anesthetized in a standard fashion   Anesthetic:  1% lidocaine w/ epinephrine 1-100,000 buffered w/ 8.4% NaHCO3 Instrument used: flexible razor blade   Hemostasis achieved with: pressure, aluminum chloride and electrodesiccation   Outcome: patient tolerated procedure well   Post-procedure details: sterile dressing applied and wound care instructions given   Dressing type: bandage and petrolatum    Specimen 1 - Surgical pathology Differential Diagnosis: D48.5 r/o dysplastic nevus Check Margins: No  Return for appointment as scheduled.  Luther Redo, CMA, am acting as scribe for Sarina Ser, MD .  Documentation: I have reviewed the above documentation for accuracy and completeness, and I agree with the above.  Sarina Ser, MD

## 2021-07-05 NOTE — Patient Instructions (Signed)

## 2021-07-07 ENCOUNTER — Telehealth: Payer: Self-pay

## 2021-07-07 NOTE — Telephone Encounter (Signed)
Left message on voicemail to return my call.  

## 2021-07-07 NOTE — Telephone Encounter (Signed)
-----   Message from Ralene Bathe, MD sent at 07/06/2021  5:28 PM EST ----- Diagnosis Skin , left plantar surface of heel MELANOCYTIC NEVUS, COMPOUND ACRAL TYPE, BASE INVOLVED  Benign mole No further treatment needed

## 2021-07-10 ENCOUNTER — Telehealth: Payer: Self-pay

## 2021-07-10 NOTE — Telephone Encounter (Signed)
Spoke with pt and informed her of results. She had no concerns.

## 2021-07-10 NOTE — Telephone Encounter (Signed)
-----   Message from Ralene Bathe, MD sent at 07/06/2021  5:28 PM EST ----- Diagnosis Skin , left plantar surface of heel MELANOCYTIC NEVUS, COMPOUND ACRAL TYPE, BASE INVOLVED  Benign mole No further treatment needed

## 2021-07-18 ENCOUNTER — Encounter: Payer: Self-pay | Admitting: Dermatology

## 2021-07-21 ENCOUNTER — Ambulatory Visit (INDEPENDENT_AMBULATORY_CARE_PROVIDER_SITE_OTHER): Payer: Medicare HMO

## 2021-07-21 ENCOUNTER — Other Ambulatory Visit: Payer: Self-pay

## 2021-07-21 DIAGNOSIS — R350 Frequency of micturition: Secondary | ICD-10-CM | POA: Diagnosis not present

## 2021-07-21 NOTE — Progress Notes (Signed)
PTNS  Session # 22/ 45  Health & Social Factors: Mother passed away Caffeine: 1 Alcohol: 0 Daytime voids #per day: 8 Night-time voids #per night: 1-2 Urgency: mild Incontinence Episodes #per day: 0 Ankle used: right Treatment Setting: 4 Feeling/ Response: sensory Comments: Patient tolerated well  Performed By: Lesli Albee  Follow Up:1 month

## 2021-08-03 ENCOUNTER — Telehealth: Payer: Self-pay | Admitting: Internal Medicine

## 2021-08-03 DIAGNOSIS — N390 Urinary tract infection, site not specified: Secondary | ICD-10-CM

## 2021-08-03 NOTE — Telephone Encounter (Signed)
Pt was seen 10/28 and was advised if she felt as if she was developing a UTI she would be able to drop off a sample and we would let her know if she needed to come in. Pt feels as if she has developed a UTI and would like to bring in a sample.

## 2021-08-03 NOTE — Telephone Encounter (Signed)
1:15 on Monday

## 2021-08-03 NOTE — Telephone Encounter (Signed)
Spoke with pt and she stated that for over a week she has had urinary frequency, urgency, hematuria, and pressure with urination. I have scheduled pt for a lab appt tomorrow and placed the orders. Is there somewhere specific that you would like for me to schedule pt for a virtual next week?

## 2021-08-04 ENCOUNTER — Other Ambulatory Visit: Payer: Self-pay

## 2021-08-04 ENCOUNTER — Other Ambulatory Visit (INDEPENDENT_AMBULATORY_CARE_PROVIDER_SITE_OTHER): Payer: Medicare HMO

## 2021-08-04 DIAGNOSIS — N390 Urinary tract infection, site not specified: Secondary | ICD-10-CM

## 2021-08-04 LAB — URINALYSIS, ROUTINE W REFLEX MICROSCOPIC
Bilirubin Urine: NEGATIVE
Hgb urine dipstick: NEGATIVE
Ketones, ur: NEGATIVE
Nitrite: NEGATIVE
RBC / HPF: NONE SEEN (ref 0–?)
Specific Gravity, Urine: 1.025 (ref 1.000–1.030)
Total Protein, Urine: NEGATIVE
Urine Glucose: NEGATIVE
Urobilinogen, UA: 0.2 (ref 0.0–1.0)
pH: 6 (ref 5.0–8.0)

## 2021-08-04 NOTE — Telephone Encounter (Signed)
Pt has been scheduled for Monday at 1:15.

## 2021-08-05 LAB — URINE CULTURE
MICRO NUMBER:: 12767682
SPECIMEN QUALITY:: ADEQUATE

## 2021-08-07 ENCOUNTER — Ambulatory Visit (INDEPENDENT_AMBULATORY_CARE_PROVIDER_SITE_OTHER): Payer: Medicare HMO | Admitting: Internal Medicine

## 2021-08-07 ENCOUNTER — Encounter: Payer: Self-pay | Admitting: Internal Medicine

## 2021-08-07 DIAGNOSIS — N390 Urinary tract infection, site not specified: Secondary | ICD-10-CM | POA: Diagnosis not present

## 2021-08-07 NOTE — Progress Notes (Signed)
Telephone  Note  This visit type was conducted due to national recommendations for restrictions regarding the COVID-19 pandemic (e.g. social distancing).  This format is felt to be most appropriate for this patient at this time.  All issues noted in this document were discussed and addressed.  No physical exam was performed (except for noted visual exam findings with Video Visits).   I connected withNAME@ on 08/07/21 at  1:15 PM EST by telephone and verified that I am speaking with the correct person using two identifiers. Location patient: home Location provider: work or home office Persons participating in the virtual visit: patient, provider  I discussed the limitations, risks, security and privacy concerns of performing an evaluation and management service by telephone and the availability of in person appointments. I also discussed with the patient that there may be a patient responsible charge related to this service. The patient expressed understanding and agreed to proceed.  Reason for visit: follow up on results of UA and urine culture  HPI:  65 yr old female with history on nonobsttuctive nephrolithiasis (3 mm right kidney by 2019 CT) presents for follow up on recurrent symptoms of UTI . Hx  developed frequency bladder spasms,hematuria. and urgency 2-3 weeks ago.  Symptoms lasted several days then resolved spontaneously.   Symptoms returned 2 more times , each time resolving with Azo for a few days.  At 3rd recurrence she called office,  Dec 14.  At time of urine submission on Dec 16.  Symptoms had resolved and have been resolved now for 5 days. s   ROS: See pertinent positives and negatives per HPI.  Past Medical History:  Diagnosis Date   Fibrocystic breast disease    rt breast aspiration 2002   Hx of dysplastic nevus 03/11/2018   Upper back right paraspinal   Hx of dysplastic nevus 12/19/2016   R inframammary, mild to moderate   Menopause    last menses 2008    Past  Surgical History:  Procedure Laterality Date   BREAST BIOPSY Right 2010   cyst aspiration/US guided biopsy, clip placement   BREAST CYST ASPIRATION Right 2002   cyst aspiration   CESAREAN SECTION  1998   MOUTH SURGERY     TUBAL LIGATION      Family History  Problem Relation Age of Onset   Cancer Mother 43       colon CA / resected completely 1999   Cancer Maternal Aunt        breast CA   Breast cancer Maternal Aunt        2 mat great aunts    SOCIAL HX:  reports that she has never smoked. She has never used smokeless tobacco. She reports current alcohol use. She reports that she does not use drugs.    Current Outpatient Medications:    ALPRAZolam (XANAX) 0.25 MG tablet, TAKE ONE-HALF TABLET BY MOUTH AT BEDTIMEAS NEEDED FOR SLEEP, Disp: 15 tablet, Rfl: 5   Ascorbic Acid (VITAMIN C) 100 MG tablet, Take 1,000 mg by mouth 2 (two) times daily. , Disp: , Rfl:    cholecalciferol (VITAMIN D3) 25 MCG (1000 UT) tablet, Take 1,000 Units by mouth daily., Disp: , Rfl:    Cyanocobalamin (VITAMIN B 12) 500 MCG TABS, Take by mouth., Disp: , Rfl:    fluticasone (FLONASE) 50 MCG/ACT nasal spray, Place 2 sprays into both nostrils daily., Disp: 16 g, Rfl: 6   MELATONIN ER PO, Take by mouth., Disp: , Rfl:  Multiple Vitamin (MULTIVITAMIN) tablet, Take 1 tablet by mouth daily., Disp: , Rfl:    TURMERIC PO, Take by mouth., Disp: , Rfl:    vitamin E 600 UNIT capsule, Take 600 Units by mouth daily., Disp: , Rfl:    zinc gluconate 50 MG tablet, Take 50 mg by mouth daily., Disp: , Rfl:    Clobetasol Prop Emollient Base (CLOBETASOL PROPIONATE E) 0.05 % emollient cream, Apply to irritated skin qd-bid prn Avoid applying to face, groin, and axilla. Use as directed. Risk of skin atrophy with long-term use reviewed. (Patient not taking: Reported on 06/16/2021), Disp: 60 g, Rfl: 1  EXAM:  VITALS per patient if applicable:  GENERAL: alert, oriented, appears well and in no acute distress  HEENT: atraumatic,  conjunttiva clear, no obvious abnormalities on inspection of external nose and ears  NECK: normal movements of the head and neck  LUNGS: on inspection no signs of respiratory distress, breathing rate appears normal, no obvious gross SOB, gasping or wheezing  CV: no obvious cyanosis  MS: moves all visible extremities without noticeable abnormality  PSYCH/NEURO: pleasant and cooperative, no obvious depression or anxiety, speech and thought processing grossly intact  ASSESSMENT AND PLAN:  Discussed the following assessment and plan:  Recurrent UTI  Recurrent UTI Symptoms have currently resolved ,  Her UA noted mild pyuria with scant bacteruria and a negative culture. Not clear if she had a UTI or passed the 3 mm stone.  Advised to start cranbery tablets/juice daily along with citric acid supplementation to prevent recurrence     I discussed the assessment and treatment plan with the patient. The patient was provided an opportunity to ask questions and all were answered. The patient agreed with the plan and demonstrated an understanding of the instructions.   The patient was advised to call back or seek an in-person evaluation if the symptoms worsen or if the condition fails to improve as anticipated.   I spent 15   minutes dedicated to the care of this patient on the date of this encounter to include pre-visit review of her medical history,  non Face-to-face time with the patient , and post visit ordering of testing and therapeutics.    Crecencio Mc, MD

## 2021-08-07 NOTE — Assessment & Plan Note (Signed)
Symptoms have currently resolved ,  Her UA noted mild pyuria with scant bacteruria and a negative culture. Not clear if she had a UTI or passed the 3 mm stone.  Advised to start cranbery tablets/juice daily along with citric acid supplementation to prevent recurrence

## 2021-08-09 ENCOUNTER — Ambulatory Visit
Admission: RE | Admit: 2021-08-09 | Discharge: 2021-08-09 | Disposition: A | Discharge status: Home / Self Care | Payer: Medicare HMO | Source: Ambulatory Visit | Attending: Internal Medicine | Admitting: Internal Medicine

## 2021-08-09 ENCOUNTER — Other Ambulatory Visit: Payer: Self-pay

## 2021-08-09 DIAGNOSIS — Z1231 Encounter for screening mammogram for malignant neoplasm of breast: Secondary | ICD-10-CM | POA: Insufficient documentation

## 2021-08-09 DIAGNOSIS — Z780 Asymptomatic menopausal state: Secondary | ICD-10-CM | POA: Insufficient documentation

## 2021-08-18 ENCOUNTER — Other Ambulatory Visit: Payer: Self-pay

## 2021-08-18 ENCOUNTER — Ambulatory Visit (INDEPENDENT_AMBULATORY_CARE_PROVIDER_SITE_OTHER): Payer: Medicare HMO | Admitting: *Deleted

## 2021-08-18 DIAGNOSIS — R350 Frequency of micturition: Secondary | ICD-10-CM | POA: Diagnosis not present

## 2021-08-18 NOTE — Progress Notes (Signed)
PTNS  Session # 23/45  Health & Social Factors: No changes Caffeine: 1 Alcohol: 0 Daytime voids #per day: 8 Night-time voids #per night: 1-2 Urgency: Mild Incontinence Episodes #per day: 0 Ankle used: Left Treatment Setting: 3 Feeling/ Response: Sensory Comments: Tolerated well  Performed By: Verlene Mayer, CMA  Follow Up: 1 month

## 2021-09-18 ENCOUNTER — Other Ambulatory Visit: Payer: Self-pay

## 2021-09-18 ENCOUNTER — Ambulatory Visit: Payer: Medicare HMO

## 2021-09-18 DIAGNOSIS — R350 Frequency of micturition: Secondary | ICD-10-CM

## 2021-09-18 NOTE — Progress Notes (Signed)
PTNS  Session # 24/45  Health & Social Factors: Pt notes that 2-3 times per week she has a cramping feeling when urinating, denies pain or hematuria. Urine recently checked with PCP, UA and CX negative.  Caffeine: 1 Alcohol: 0 Daytime voids #per day: 8 Night-time voids #per night: 1-2 Urgency: Mild Incontinence Episodes #per day: 0 Ankle used: Right Treatment Setting: 1 Feeling/ Response: Sensory & Toe Flex  Comments: N/A  Performed By: Gordy Clement, CMA   Follow Up: RTC in 1 month for PTNS

## 2021-10-16 ENCOUNTER — Ambulatory Visit: Payer: Medicare HMO

## 2021-10-17 ENCOUNTER — Other Ambulatory Visit: Payer: Self-pay

## 2021-10-17 ENCOUNTER — Ambulatory Visit (INDEPENDENT_AMBULATORY_CARE_PROVIDER_SITE_OTHER): Payer: Medicare HMO | Admitting: *Deleted

## 2021-10-17 DIAGNOSIS — R350 Frequency of micturition: Secondary | ICD-10-CM

## 2021-10-17 NOTE — Progress Notes (Signed)
PTNS   Session # 25/45   Health & Social Factors: Caffeine: 1 Alcohol: 0 Daytime voids #per day: 8 Night-time voids #per night: 1-2 Urgency: Mild Incontinence Episodes #per day: 0 Ankle used: Right Treatment Setting: 3 Feeling/ Response: Sensory & Toe Flex  Comments: N/A   Performed By: Gaspar Cola CMA    Follow Up: RTC in 1 month for PTNS

## 2021-10-30 ENCOUNTER — Other Ambulatory Visit: Payer: Self-pay | Admitting: Internal Medicine

## 2021-11-14 ENCOUNTER — Other Ambulatory Visit: Payer: Self-pay

## 2021-11-14 ENCOUNTER — Ambulatory Visit: Payer: Medicare HMO | Admitting: *Deleted

## 2021-11-14 DIAGNOSIS — R350 Frequency of micturition: Secondary | ICD-10-CM

## 2021-11-14 NOTE — Progress Notes (Signed)
PTNS ?  ?Session # 26/45 ?  ?Health & Social Factors: ?Caffeine: 1 ?Alcohol: 0 ?Daytime voids #per day: 8 ?Night-time voids #per night: 1-2 ?Urgency: Mild ?Incontinence Episodes #per day: 0 ?Ankle used: Right ?Treatment Setting: 4 ?Feeling/ Response: Sensory & Toe Flex  ?Comments: N/A ?  ?Performed By: Gaspar Cola CMA  ?  ?Follow Up: RTC in 1 month for PTNS  ?

## 2021-12-19 ENCOUNTER — Encounter: Payer: Self-pay | Admitting: Urology

## 2021-12-19 ENCOUNTER — Ambulatory Visit (INDEPENDENT_AMBULATORY_CARE_PROVIDER_SITE_OTHER): Payer: Medicare HMO | Admitting: Urology

## 2021-12-19 VITALS — BP 123/66 | HR 91 | Ht 59.0 in | Wt 102.0 lb

## 2021-12-19 DIAGNOSIS — N200 Calculus of kidney: Secondary | ICD-10-CM | POA: Diagnosis not present

## 2021-12-19 DIAGNOSIS — R351 Nocturia: Secondary | ICD-10-CM | POA: Diagnosis not present

## 2021-12-19 DIAGNOSIS — R310 Gross hematuria: Secondary | ICD-10-CM

## 2021-12-19 DIAGNOSIS — N3281 Overactive bladder: Secondary | ICD-10-CM | POA: Diagnosis not present

## 2021-12-19 NOTE — Progress Notes (Signed)
? ? ?12/19/2021 ?4:18 PM  ? ?Debbie Sanchez ?07-24-1956 ?[id number] ? ?Referring provider: Crecencio Mc, MD ?[address] ?[address] ?[place],  [place] [address] ? ?Chief Complaint  ?Patient presents with  ? Urinary Frequency  ?  Discuss treatment options  ? ?Urological History: ?1. High risk hematuria ?-non-smoker ?-CTU 2019 -no concerning neurological findings ?-cysto 2019 -urethral caruncle ?-reports of gross heme ? ?2. Urethral caruncle ?-present on 2019 cysto ? ?3. Refractory OAB ?-Contributing factors of age and vaginal atrophy ?-failed anticholinergics ?-declined Myrbetriq secondary to potential side effects ?-failed PTNS ? ?4. Nephrolithiasis ?-CTU 2019 3 mm right renal stone ? ? ?HPI: ?Debbie Sanchez is a 66 y.o. female who presents today after feeling PTNS did not help.   ? ?She is having 5 to 6 times before 11 am and then it tapers off.  She will go 3 to 4 hours in the evenings without the urge to urinate and then void before going to bed.  She then is having nocturia x 2-3.   ? ?She does not take prescription drugs on a regular basis.  She drinks one cup of coffee in the am, the rest of the day she drinks iced tea the is decaf and is not sweetened.  She drinks hot tea in the afternoon on occasion.  Water will make her go more, so she tends to drink less of that.   ? ?She does wake up due to her mind racing.   ? ?Patient denies any modifying or aggravating factors.  Patient denies any gross hematuria, dysuria or suprapubic/flank pain.  Patient denies any fevers, chills, nausea or vomiting.   ? ?She did have an incident of intermittent gross heme and cramping.  She mentioned that she had a redundant colon.   ? ?PMH: ?Past Medical History:  ?Diagnosis Date  ? Fibrocystic breast disease   ? rt breast aspiration 2002  ? Hx of dysplastic nevus 03/11/2018  ? Upper back right paraspinal  ? Hx of dysplastic nevus 12/19/2016  ? R inframammary, mild to moderate  ? Menopause   ? last menses 2008  ? ? ?Surgical  History: ?Past Surgical History:  ?Procedure Laterality Date  ? BREAST BIOPSY Right 2010  ? cyst aspiration/US guided biopsy, clip placement  ? BREAST CYST ASPIRATION Right 2002  ? cyst aspiration  ? [place]  ? MOUTH SURGERY    ? TUBAL LIGATION    ? ? ?Home Medications:  ?Allergies as of 12/19/2021   ?No Known Allergies ?  ? ?  ?Medication List  ?  ? ?  ? Accurate as of Dec 19, 2021  4:18 PM. If you have any questions, ask your nurse or doctor.  ?  ?  ? ?  ? ?STOP taking these medications   ? ?Clobetasol Prop Emollient Base 0.05 % emollient cream ?Commonly known as: Clobetasol Propionate E ?Stopped by: Zara Council, PA-C ?  ?fluticasone 50 MCG/ACT nasal spray ?Commonly known as: FLONASE ?Stopped by: Zara Council, PA-C ?  ? ?  ? ?TAKE these medications   ? ?ALPRAZolam 0.25 MG tablet ?Commonly known as: Duanne Moron ?TAKE ONE-HALF TABLET BY MOUTH AT [place] ?  ?cholecalciferol 25 MCG (1000 UNIT) tablet ?Commonly known as: VITAMIN D3 ?Take 1,000 Units by mouth daily. ?  ?MELATONIN ER PO ?Take by mouth. ?  ?multivitamin tablet ?Take 1 tablet by mouth daily. ?  ?TURMERIC PO ?Take by mouth. ?  ?Vitamin B 12 500 MCG Tabs ?  Take by mouth. ?  ?vitamin C 100 MG tablet ?Take 1,000 mg by mouth 2 (two) times daily. ?  ?vitamin E 600 UNIT capsule ?Take 600 Units by mouth daily. ?  ?zinc gluconate 50 MG tablet ?Take 50 mg by mouth daily. ?  ? ?  ? ? ?Allergies: No Known Allergies ? ?Family History: ?Family History  ?Problem Relation Age of Onset  ? Cancer Mother 58  ?     colon CA / resected completely 1999  ? Cancer Maternal Aunt   ?     breast CA  ? Breast cancer Other   ? ? ?Social History:  reports that she has never smoked. She has never used smokeless tobacco. She reports current alcohol use. She reports that she does not use drugs. ? ?ROS: ?Pertinent ROS in HPI ? ?Physical Exam: ?BP 123/66   Pulse 91   Ht '4\' 11"'$  (1.499 m)   Wt 102 lb (46.3 kg)   BMI 20.60 kg/m?   ?Constitutional:  Well  nourished. Alert and oriented, No acute distress. ?HEENT: [place] AT, moist mucus membranes.  Trachea midline, no masses. ?Cardiovascular: No clubbing, cyanosis, or edema. ?Respiratory: Normal respiratory effort, no increased work of breathing. ?Neurologic: Grossly intact, no focal deficits, moving all 4 extremities. ?Psychiatric: Normal mood and affect. ? ?Laboratory Data: ?Lab Results  ?Component Value Date  ? WBC 5.7 06/16/2021  ? HGB 14.3 06/16/2021  ? HCT 43.2 06/16/2021  ? MCV 90.7 06/16/2021  ? PLT 251.0 06/16/2021  ? ? ?Lab Results  ?Component Value Date  ? CREATININE 0.61 06/16/2021  ? ?Lab Results  ?Component Value Date  ? TSH 1.19 06/16/2021  ? ? ?Lab Results  ?Component Value Date  ? AST 18 06/16/2021  ? ?Lab Results  ?Component Value Date  ? ALT 18 06/16/2021  ? ? ?Urinalysis ?   ?Component Value Date/Time  ? COLORURINE YELLOW 08/04/2021 1039  ? APPEARANCEUR CLEAR 08/04/2021 1039  ? APPEARANCEUR Cloudy (A) 02/13/2021 1438  ? LABSPEC 1.025 08/04/2021 1039  ? PHURINE 6.0 08/04/2021 1039  ? GLUCOSEU NEGATIVE 08/04/2021 1039  ? HGBUR NEGATIVE 08/04/2021 1039  ? [place] NEGATIVE 08/04/2021 1039  ? BILIRUBINUR Negative 02/13/2021 1438  ? [place] NEGATIVE 08/04/2021 1039  ? PROTEINUR 1+ (A) 02/13/2021 1438  ? UROBILINOGEN 0.2 08/04/2021 1039  ? NITRITE NEGATIVE 08/04/2021 1039  ? LEUKOCYTESUR SMALL (A) 08/04/2021 1039  ?I have reviewed the labs. ? ? ?Pertinent Imaging: ?N/A  ? ?Assessment & Plan:   ? ?1. Gross hematuria ?-Although patient does have history of an urethral caruncle, which may be the source of the gross hematuria she was seeing, but with the gross heme coupled with other symptoms of a cramping sensation and refractory OAB, we discussed repeating hematuria work-up as it has been 4 years since her last evaluation and she would like to repeat the CT urogram and cystoscopy at this time ?-CT urogram  ?-cysto ? ?2. Refractory OAB ?-failed anticholinergics ?-was not comfortable with the side effect  profile of Myrbetriq ?-failed PTNS ?-if hematuria work up is negative, we can have further discussion regarding her OAB treatment and may consider a trial with Gemtesa or Botox  ? ?3. Nephrolithiasis ?-a 3 mm right renal stone was seen on 2019 imaging ?-her cramping sensation may of been the result of a stone passing or trying to pass ?-CT urogram is pending  ? ?Return for Cysto w/CTresults . ? ?These notes generated with voice recognition software. I apologize for typographical errors. ? ?Shaheim Mahar  Shiheem Corporan, PA-C ? ?[place] ?[place], [place] [address] ?[phone] ?  ?

## 2021-12-19 NOTE — Patient Instructions (Signed)

## 2022-01-02 ENCOUNTER — Ambulatory Visit
Admission: RE | Admit: 2022-01-02 | Discharge: 2022-01-02 | Disposition: A | Discharge status: Home / Self Care | Payer: Medicare HMO | Source: Ambulatory Visit | Attending: Urology | Admitting: Urology

## 2022-01-02 DIAGNOSIS — R310 Gross hematuria: Secondary | ICD-10-CM | POA: Insufficient documentation

## 2022-01-02 LAB — POCT I-STAT CREATININE: Creatinine, Ser: 0.8 mg/dL (ref 0.44–1.00)

## 2022-01-02 MED ORDER — IOHEXOL 350 MG/ML SOLN
75.0000 mL | Freq: Once | INTRAVENOUS | Status: AC | PRN
  Administered 2022-01-02: 75 mL via INTRAVENOUS

## 2022-01-04 ENCOUNTER — Ambulatory Visit: Payer: Medicare HMO | Admitting: Dermatology

## 2022-01-04 DIAGNOSIS — Z1283 Encounter for screening for malignant neoplasm of skin: Secondary | ICD-10-CM

## 2022-01-04 DIAGNOSIS — L821 Other seborrheic keratosis: Secondary | ICD-10-CM

## 2022-01-04 DIAGNOSIS — D1800 Hemangioma unspecified site: Secondary | ICD-10-CM

## 2022-01-04 DIAGNOSIS — L578 Other skin changes due to chronic exposure to nonionizing radiation: Secondary | ICD-10-CM | POA: Diagnosis not present

## 2022-01-04 DIAGNOSIS — L719 Rosacea, unspecified: Secondary | ICD-10-CM

## 2022-01-04 DIAGNOSIS — D229 Melanocytic nevi, unspecified: Secondary | ICD-10-CM

## 2022-01-04 DIAGNOSIS — Z86018 Personal history of other benign neoplasm: Secondary | ICD-10-CM

## 2022-01-04 DIAGNOSIS — L814 Other melanin hyperpigmentation: Secondary | ICD-10-CM

## 2022-01-04 NOTE — Progress Notes (Signed)
Follow-Up Visit   Subjective  Debbie Sanchez is a 66 y.o. female who presents for the following: Total body skin  exm (Hx of Dyspalstic nevi Upper back R paraspinal, R inframammary). The patient presents for Total-Body Skin Exam (TBSE) for skin cancer screening and mole check.  The patient has spots, moles and lesions to be evaluated, some may be new or changing and the patient has concerns that these could be cancer.   The following portions of the chart were reviewed this encounter and updated as appropriate:   Tobacco  Allergies  Meds  Problems  Med Hx  Surg Hx  Fam Hx     Review of Systems:  No other skin or systemic complaints except as noted in HPI or Assessment and Plan.  Objective  Well appearing patient in no apparent distress; mood and affect are within normal limits.  A full examination was performed including scalp, head, eyes, ears, nose, lips, neck, chest, axillae, abdomen, back, buttocks, bilateral upper extremities, bilateral lower extremities, hands, feet, fingers, toes, fingernails, and toenails. All findings within normal limits unless otherwise noted below.  face Erythema face   Assessment & Plan   History of Dysplastic Nevi - No evidence of recurrence today - Recommend regular full body skin exams - Recommend daily broad spectrum sunscreen SPF 30+ to sun-exposed areas, reapply every 2 hours as needed.  - Call if any new or changing lesions are noted between office visits  - Upper back R paraspinal, R inframammary  Lentigines - Scattered tan macules - Due to sun exposure - Benign-appearing, observe - Recommend daily broad spectrum sunscreen SPF 30+ to sun-exposed areas, reapply every 2 hours as needed. - Call for any changes - arms  Seborrheic Keratoses - Stuck-on, waxy, tan-brown papules and/or plaques  - Benign-appearing - Discussed benign etiology and prognosis. - Observe - Call for any changes - arms, trunk  Melanocytic Nevi - Tan-brown  and/or pink-flesh-colored symmetric macules and papules - Benign appearing on exam today - Observation - Call clinic for new or changing moles - Recommend daily use of broad spectrum spf 30+ sunscreen to sun-exposed areas.  - back  Hemangiomas - Red papules - Discussed benign nature - Observe - Call for any changes - trunk  Actinic Damage - Chronic condition, secondary to cumulative UV/sun exposure - diffuse scaly erythematous macules with underlying dyspigmentation - Recommend daily broad spectrum sunscreen SPF 30+ to sun-exposed areas, reapply every 2 hours as needed.  - Staying in the shade or wearing long sleeves, sun glasses (UVA+UVB protection) and wide brim hats (4-inch brim around the entire circumference of the hat) are also recommended for sun protection.  - Call for new or changing lesions.  Skin cancer screening performed today.   Rosacea face  Rosacea is a chronic progressive skin condition usually affecting the face of adults, causing redness and/or acne bumps. It is treatable but not curable. It sometimes affects the eyes (ocular rosacea) as well. It may respond to topical and/or systemic medication and can flare with stress, sun exposure, alcohol, exercise and some foods.  Daily application of broad spectrum spf 30+ sunscreen to face is recommended to reduce flares.  Discussed the treatment option of BBL/laser.  Typically we recommend 1-3 treatment sessions about 5-8 weeks apart for best results.  The patient's condition may require "maintenance treatments" in the future.  The fee for BBL / laser treatments is $350 per treatment session for the whole face.  A fee can be quoted  for other parts of the body. Insurance typically does not pay for BBL/laser treatments and therefore the fee is an out-of-pocket cost.  Skin cancer screening  Return in about 1 year (around 01/05/2023) for TBSE, Hx of Dysplastic nevi.  I, Othelia Pulling, RMA, am acting as scribe for Sarina Ser, MD . Documentation: I have reviewed the above documentation for accuracy and completeness, and I agree with the above.  Sarina Ser, MD

## 2022-01-04 NOTE — Patient Instructions (Signed)

## 2022-01-09 ENCOUNTER — Ambulatory Visit: Payer: Medicare HMO | Admitting: Urology

## 2022-01-09 ENCOUNTER — Encounter: Payer: Self-pay | Admitting: Urology

## 2022-01-09 VITALS — BP 112/71 | HR 77

## 2022-01-09 DIAGNOSIS — R8281 Pyuria: Secondary | ICD-10-CM

## 2022-01-09 DIAGNOSIS — R3129 Other microscopic hematuria: Secondary | ICD-10-CM | POA: Diagnosis not present

## 2022-01-09 LAB — URINALYSIS, COMPLETE
Bilirubin, UA: NEGATIVE
Glucose, UA: NEGATIVE
Ketones, UA: NEGATIVE
Nitrite, UA: NEGATIVE
Protein,UA: NEGATIVE
RBC, UA: NEGATIVE
Specific Gravity, UA: >1.03 — ABNORMAL HIGH (ref 1.005–1.030)
Urobilinogen, Ur: 0.2 mg/dL (ref 0.2–1.0)
pH, UA: 5.5 (ref 5.0–7.5)

## 2022-01-09 LAB — MICROSCOPIC EXAMINATION: WBC, UA: >30 /hpf — AB (ref 0–5)

## 2022-01-09 NOTE — Progress Notes (Signed)
   01/09/2022  CC: No chief complaint on file.   HPI: Debbie Sanchez is a 66 y.o.female with a personal history of high risk hematuria, urethral caruncle, refractory OAB, and nephrolithiasis who presents today for cystoscopy.   She had negative hematuria work-up in 2019 that revealed a urethral caruncle.   CTU on 01/02/2022 visualized a small nonobstructive calculus of the inferior pole of the right kidney. No left-sided calculi, ureteral calculi, or hydronephrosis. No urinary tract mass, suspicious contrast enhancement, or filling defect on delayed phase imaging. Prominent bilateral uterine and adnexal varices  UA today >30 WBCs but otherwise unremarkable   There were no vitals filed for this visit. NED. A&Ox3.   No respiratory distress   Abd soft, NT, ND Normal external genitalia with patent urethral meatus  Cystoscopy Procedure Note  Patient identification was confirmed, informed consent was obtained, and patient was prepped using Betadine solution.  Lidocaine jelly was administered per urethral meatus.    Procedure: - Flexible cystoscope introduced, without any difficulty.   - Thorough search of the bladder revealed:    normal urethral meatus    normal urothelium    no stones    no ulcers     no tumors    no urethral polyps    no trabeculation  - Ureteral orifices were normal in position and appearance.  Post-Procedure: - Patient tolerated the procedure well  Assessment/ Plan: Gross hematuria  - Cystoscopy unremarkable today  - CTU was reassuring - Discussed with her that if her gross hematuria returns or worsens she soul follow-up here   2. Pyuria  - Asymptomatic 'would not recmoned treatment in absence of symptoms   No follow-ups on file.  I,Kailey Littlejohn,acting as a Education administrator for Hollice Espy, MD.,have documented all relevant documentation on the behalf of Hollice Espy, MD,as directed by  Hollice Espy, MD while in the presence of Hollice Espy, MD.

## 2022-01-14 ENCOUNTER — Encounter: Payer: Self-pay | Admitting: Dermatology

## 2022-01-15 ENCOUNTER — Other Ambulatory Visit: Payer: Self-pay | Admitting: Family

## 2022-01-17 NOTE — Telephone Encounter (Signed)
Last refill: 10/30/21 #15,0 Last OV: 08/07/21 dx. UTI

## 2022-03-18 ENCOUNTER — Other Ambulatory Visit: Payer: Self-pay | Admitting: Internal Medicine

## 2022-06-18 ENCOUNTER — Encounter: Payer: Self-pay | Admitting: Internal Medicine

## 2022-06-18 ENCOUNTER — Ambulatory Visit (INDEPENDENT_AMBULATORY_CARE_PROVIDER_SITE_OTHER): Payer: Medicare HMO

## 2022-06-18 ENCOUNTER — Ambulatory Visit (INDEPENDENT_AMBULATORY_CARE_PROVIDER_SITE_OTHER): Payer: Medicare HMO | Admitting: Internal Medicine

## 2022-06-18 VITALS — BP 128/64 | HR 89 | Temp 97.7°F | Ht 59.0 in | Wt 103.0 lb

## 2022-06-18 VITALS — BP 128/64 | HR 89 | Temp 97.7°F | Resp 15 | Ht 59.0 in | Wt 103.2 lb

## 2022-06-18 DIAGNOSIS — R5383 Other fatigue: Secondary | ICD-10-CM | POA: Diagnosis not present

## 2022-06-18 DIAGNOSIS — R319 Hematuria, unspecified: Secondary | ICD-10-CM

## 2022-06-18 DIAGNOSIS — Z1231 Encounter for screening mammogram for malignant neoplasm of breast: Secondary | ICD-10-CM

## 2022-06-18 DIAGNOSIS — Z0000 Encounter for general adult medical examination without abnormal findings: Secondary | ICD-10-CM

## 2022-06-18 DIAGNOSIS — M810 Age-related osteoporosis without current pathological fracture: Secondary | ICD-10-CM | POA: Diagnosis not present

## 2022-06-18 DIAGNOSIS — Z23 Encounter for immunization: Secondary | ICD-10-CM | POA: Diagnosis not present

## 2022-06-18 DIAGNOSIS — R7301 Impaired fasting glucose: Secondary | ICD-10-CM | POA: Diagnosis not present

## 2022-06-18 DIAGNOSIS — E559 Vitamin D deficiency, unspecified: Secondary | ICD-10-CM

## 2022-06-18 DIAGNOSIS — G9331 Postviral fatigue syndrome: Secondary | ICD-10-CM

## 2022-06-18 DIAGNOSIS — F409 Phobic anxiety disorder, unspecified: Secondary | ICD-10-CM | POA: Insufficient documentation

## 2022-06-18 DIAGNOSIS — R8281 Pyuria: Secondary | ICD-10-CM

## 2022-06-18 DIAGNOSIS — F5105 Insomnia due to other mental disorder: Secondary | ICD-10-CM

## 2022-06-18 DIAGNOSIS — E785 Hyperlipidemia, unspecified: Secondary | ICD-10-CM

## 2022-06-18 DIAGNOSIS — N362 Urethral caruncle: Secondary | ICD-10-CM

## 2022-06-18 DIAGNOSIS — G250 Essential tremor: Secondary | ICD-10-CM

## 2022-06-18 LAB — VITAMIN D 25 HYDROXY (VIT D DEFICIENCY, FRACTURES): VITD: 53.55 ng/mL (ref 30.00–100.00)

## 2022-06-18 LAB — COMPREHENSIVE METABOLIC PANEL
ALT: 34 U/L (ref 0–35)
AST: 28 U/L (ref 0–37)
Albumin: 3.9 g/dL (ref 3.5–5.2)
Alkaline Phosphatase: 90 U/L (ref 39–117)
BUN: 19 mg/dL (ref 6–23)
CO2: 31 mEq/L (ref 19–32)
Calcium: 9.7 mg/dL (ref 8.4–10.5)
Chloride: 103 mEq/L (ref 96–112)
Creatinine, Ser: 0.52 mg/dL (ref 0.40–1.20)
GFR: 97.03 mL/min (ref >60.00)
Glucose, Bld: 99 mg/dL (ref 70–99)
Potassium: 4.2 mEq/L (ref 3.5–5.1)
Sodium: 141 mEq/L (ref 135–145)
Total Bilirubin: 0.3 mg/dL (ref 0.2–1.2)
Total Protein: 6.9 g/dL (ref 6.0–8.3)

## 2022-06-18 LAB — LIPID PANEL
Cholesterol: 203 mg/dL — ABNORMAL HIGH (ref 0–200)
HDL: 65.9 mg/dL (ref >39.00)
LDL Cholesterol: 113 mg/dL — ABNORMAL HIGH (ref 0–99)
NonHDL: 136.92
Total CHOL/HDL Ratio: 3
Triglycerides: 121 mg/dL (ref 0.0–149.0)
VLDL: 24.2 mg/dL (ref 0.0–40.0)

## 2022-06-18 LAB — CBC WITH DIFFERENTIAL/PLATELET
Basophils Absolute: 0 10*3/uL (ref 0.0–0.1)
Basophils Relative: 0.3 % (ref 0.0–3.0)
Eosinophils Absolute: 0.1 10*3/uL (ref 0.0–0.7)
Eosinophils Relative: 1.5 % (ref 0.0–5.0)
HCT: 40.6 % (ref 36.0–46.0)
Hemoglobin: 13.4 g/dL (ref 12.0–15.0)
Lymphocytes Relative: 23.4 % (ref 12.0–46.0)
Lymphs Abs: 1.6 10*3/uL (ref 0.7–4.0)
MCHC: 33 g/dL (ref 30.0–36.0)
MCV: 87 fl (ref 78.0–100.0)
Monocytes Absolute: 0.5 10*3/uL (ref 0.1–1.0)
Monocytes Relative: 7.9 % (ref 3.0–12.0)
Neutro Abs: 4.5 10*3/uL (ref 1.4–7.7)
Neutrophils Relative %: 66.9 % (ref 43.0–77.0)
Platelets: 256 10*3/uL (ref 150.0–400.0)
RBC: 4.67 Mil/uL (ref 3.87–5.11)
RDW: 14.4 % (ref 11.5–15.5)
WBC: 6.8 10*3/uL (ref 4.0–10.5)

## 2022-06-18 LAB — HEMOGLOBIN A1C: Hgb A1c MFr Bld: 6.2 % (ref 4.6–6.5)

## 2022-06-18 LAB — LDL CHOLESTEROL, DIRECT: Direct LDL: 123 mg/dL

## 2022-06-18 LAB — TSH: TSH: 1.24 u[IU]/mL (ref 0.35–5.50)

## 2022-06-18 MED ORDER — OMEPRAZOLE 20 MG PO CPDR: 20.0000 mg | DELAYED_RELEASE_CAPSULE | Freq: Every day | ORAL | 3 refills | Status: DC

## 2022-06-18 MED ORDER — IPRATROPIUM BROMIDE 0.03 % NA SOLN: 2.0000 | Freq: Two times a day (BID) | NASAL | 12 refills | Status: DC

## 2022-06-18 MED ORDER — PROPRANOLOL HCL 10 MG PO TABS: 10.0000 mg | ORAL_TABLET | Freq: Three times a day (TID) | ORAL | 0 refills | Status: AC

## 2022-06-18 MED ORDER — TETANUS-DIPHTH-ACELL PERTUSSIS 5-2.5-18.5 LF-MCG/0.5 IM SUSY: 0.5000 mL | PREFILLED_SYRINGE | Freq: Once | INTRAMUSCULAR | 0 refills | Status: AC

## 2022-06-18 NOTE — Progress Notes (Signed)
Patient ID: Debbie Sanchez, female    DOB: 06/06/1956  Age: 66 y.o. MRN: [id number]  The patient is here for annual PREVENTIVE examination and management of other chronic and acute problems.   The risk factors are reflected in the social history.  The roster of all physicians providing medical care to patient - is listed in the Snapshot section of the chart.  Activities of daily living:  The patient is 100% independent in all ADLs: dressing, toileting, feeding as well as independent mobility  Home safety : The patient has smoke detectors in the home. They wear seatbelts.  There are no firearms at home. There is no violence in the home.   There is no risks for hepatitis, STDs or HIV. There is no   history of blood transfusion. They have no travel history to infectious disease endemic areas of the world.  The patient has seen their dentist in the last six month. They have seen their eye doctor in the last year. They admit to slight hearing difficulty with regard to whispered voices and some television programs.  They have deferred audiologic testing in the last year.  They do not  have excessive sun exposure. Discussed the need for sun protection: hats, long sleeves and use of sunscreen if there is significant sun exposure.   Diet: the importance of a healthy diet is discussed. They do have a healthy diet.  The benefits of regular aerobic exercise were discussed. She walks 4 times per week ,  20 minutes.   Depression screen: there are no signs or vegative symptoms of depression- irritability, change in appetite, anhedonia, sadness/tearfullness.  Cognitive assessment: the patient manages all their financial and personal affairs and is actively engaged. They could relate day,date,year and events; recalled 2/3 objects at 3 minutes; performed clock-face test normally.  The following portions of the patient's history were reviewed and updated as appropriate: allergies, current medications, past  family history, past medical history,  past surgical history, past social history  and problem list.  Visual acuity was not assessed per patient preference since she has regular follow up with her ophthalmologist. Hearing and body mass index were assessed and reviewed.   During the course of the visit the patient was educated and counseled about appropriate screening and preventive services including : fall prevention , diabetes screening, nutrition counseling, colorectal cancer screening, and recommended immunizations.    CC: The primary encounter diagnosis was Fatigue, unspecified type. Diagnoses of Hyperlipidemia, unspecified hyperlipidemia type, Impaired fasting glucose, Encounter for screening mammogram for malignant neoplasm of breast, Hematuria, undiagnosed cause, Urethral caruncle, Pyuria, Age-related osteoporosis without current pathological fracture, Vitamin D deficiency, Benign essential tremor, Postviral fatigue syndrome, Need for immunization against influenza, Encounter for preventive health examination, and Insomnia due to anxiety and fear were also pertinent to this visit.    1) Chronic PND :  has had allergy testing by ENT.  Constant throat clearing .  No change with flonase. Has tried saline irrgation in the past without help.   wants to see ENT   2) BET:  no change   3) Tinnitus, chronic  4) mild neuropathy fot the past tne years  hands and feet  no change  5) overactive bladder;  stopped the acupuncture bc it has not helped. Has 2 nighttime voids.  Voids every hour during the day .  Does not want medication trial   6) taking cranberry tablets  no recent UTI's.  Reviewed cystoscopy,   7) osteoporosis:  taking  calcium and vitamin D .  Doing water aerobics 5 days per week   8) disrupted sleep :  has tried low dose alprazolam ,  stopped the medication keeping a food and activity level , higher anxiety levels and overeating noted to be causative. . Using melatonin   9)  Anxiety: mother passed away in 07-03-2021 and daughter has mental health issues   10) Had a headache in July that lasted several days and was followed by significant fatigue and night sweats  that lasted two to three weeks , ocurred while she was out of town.  [name] has a past medical history of Fibrocystic breast disease, dysplastic nevus (03/11/2018), dysplastic nevus (12/19/2016), and Menopause.   She has a past surgical history that includes Tubal ligation; Cesarean section (1998); Mouth surgery; Breast cyst aspiration (Right, 2002); and Breast biopsy (Right, 2010).   Her family history includes Breast cancer in an other family member; Cancer in her maternal aunt; Cancer (age of onset: 62) in her mother; Stroke in her mother.She reports that she has never smoked. She has never used smokeless tobacco. She reports current alcohol use. She reports that she does not use drugs.  Outpatient Medications Prior to Visit  Medication Sig Dispense Refill   ALPRAZolam (XANAX) 0.25 MG tablet TAKE ONE-HALF TABLET BY MOUTH AT BEDTIMEAS NEEDED FOR SLEEP 15 tablet 5   Ascorbic Acid (VITAMIN C) 100 MG tablet Take 1,000 mg by mouth 2 (two) times daily.      Calcium Carbonate-Vit D-Min (CALCIUM 1200 PO) Take 1 tablet by mouth.     Cholecalciferol (VITAMIN D3) 125 MCG (5000 UT) TABS Take 5,000 Units by mouth.     CRANBERRY CONCENTRATE PO Take 1 tablet by mouth.     Cyanocobalamin (VITAMIN B 12) 500 MCG TABS Take by mouth.     MELATONIN ER PO Take by mouth.     Multiple Vitamin (MULTIVITAMIN) tablet Take 1 tablet by mouth daily.     TURMERIC PO Take by mouth.     vitamin E 600 UNIT capsule Take 600 Units by mouth daily.     zinc gluconate 50 MG tablet Take 50 mg by mouth daily.     No facility-administered medications prior to visit.    Review of Systems  Patient denies headache, fevers, malaise, unintentional weight loss, skin rash, eye pain, sinus congestion and sinus pain, sore throat,  dysphagia,  hemoptysis , cough, dyspnea, wheezing, chest pain, palpitations, orthopnea, edema, abdominal pain, nausea, melena, diarrhea, constipation, flank pain, dysuria, , urinary  Frequency, nocturia, numbness, tingling, seizures,  Focal weakness, Loss of consciousness,  , depression,, and suicidal ideation.     Objective:  BP 128/64 (BP Location: Left Arm, Patient Position: Sitting, Cuff Size: Normal)   Pulse 89   Temp 97.7 F (36.5 C) (Oral)   Ht 4' 11" (1.499 m)   Wt 103 lb (46.7 kg)   SpO2 98%   BMI 20.80 kg/m   Physical Exam   General appearance: alert, cooperative and appears stated age Head: Normocephalic, without obvious abnormality, atraumatic Eyes: conjunctivae/corneas clear. PERRL, EOM's intact. Fundi benign. Ears: normal TM's and external ear canals both ears Nose: Nares normal. Septum midline. Mucosa normal. No drainage or sinus tenderness. Throat: lips, mucosa, and tongue normal; teeth and gums normal Neck: no adenopathy, no carotid bruit, no JVD, supple, symmetrical, trachea midline and thyroid not enlarged, symmetric, no tenderness/mass/nodules Lungs: clear to auscultation bilaterally Breasts: normal appearance, no masses or tenderness Heart: regular rate and  rhythm, S1, S2 normal, no murmur, click, rub or gallop Abdomen: soft, non-tender; bowel sounds normal; no masses,  no organomegaly Extremities: extremities normal, atraumatic, no cyanosis or edema Pulses: 2+ and symmetric Skin: Skin color, texture, turgor normal. No rashes or lesions Neurologic: Alert and oriented X 3, normal strength and tone. Normal symmetric reflexes. Normal coordination and gait.    Assessment & Plan:   Problem List Items Addressed This Visit     Encounter for preventive health examination    age appropriate education and counseling updated, referrals for preventative services and immunizations addressed, dietary and smoking counseling addressed, most recent labs reviewed.  I have  personally reviewed and have noted:   1) the patient's medical and social history 2) The pt's use of alcohol, tobacco, and illicit drugs 3) The patient's current medications and supplements 4) Functional ability including ADL's, fall risk, home safety risk, hearing and visual impairment 5) Diet and physical activities 6) Evidence for depression or mood disorder 7) The patient's height, weight, and BMI have been recorded in the chart I have made referrals, and provided counseling and education based on review of the above      Osteoporosis    Declines treatment  In favor of dietary and exercise       Benign essential tremor    Not progressive,  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  rx propranolol for prm use       Hematuria, undiagnosed cause    Reviewed urology workup. NORMAL CYSTOSCOPY BY DR Erlene Quan  IN MAY 2023      Urethral caruncle   Pyuria    NOTED BY UROLOGIST DURING LAST OV .  Advised patient that it will not be treated unless symptoms of UTI are present       Insomnia due to anxiety and fear    She is managing her anxiety without benzodiazepines       Other Visit Diagnoses     Fatigue, unspecified type    -  Primary   Relevant Orders   CBC with Differential/Platelet (Completed)   TSH (Completed)   Hyperlipidemia, unspecified hyperlipidemia type       Relevant Medications   propranolol (INDERAL) 10 MG tablet   Other Relevant Orders   Lipid Profile (Completed)   Direct LDL (Completed)   Impaired fasting glucose       Relevant Orders   Comp Met (CMET) (Completed)   HgB A1c (Completed)   Encounter for screening mammogram for malignant neoplasm of breast       Relevant Orders   MM 3D SCREEN BREAST BILATERAL   Vitamin D deficiency       Relevant Orders   VITAMIN D 25 Hydroxy (Vit-D Deficiency, Fractures) (Completed)   Postviral fatigue syndrome       Relevant Orders   Ehrlichia antibody panel   Rocky mtn  spotted fvr abs pnl(IgG+IgM)   Need for immunization against influenza       Relevant Orders   Flu Vaccine QUAD High Dose(Fluad) (Completed)       I am having Debbie Sanchez start on omeprazole, propranolol, Tdap, and ipratropium. I am also having her maintain her multivitamin, vitamin C, MELATONIN ER PO, Vitamin B 12, zinc gluconate, vitamin E, TURMERIC PO, ALPRAZolam, Vitamin D3, CRANBERRY CONCENTRATE PO, and Calcium Carbonate-Vit D-Min (CALCIUM 1200 PO).  Meds ordered this encounter  Medications   omeprazole (PRILOSEC) 20 MG capsule    Sig: Take  1 capsule (20 mg total) by mouth daily.    Dispense:  30 capsule    Refill:  3   propranolol (INDERAL) 10 MG tablet    Sig: Take 1 tablet (10 mg total) by mouth 3 (three) times daily. As needed for rapid heart rate    Dispense:  30 tablet    Refill:  0   Tdap (BOOSTRIX) 5-2.5-18.5 LF-MCG/0.5 injection    Sig: Inject 0.5 mLs into the muscle once for 1 dose.    Dispense:  0.5 mL    Refill:  0   ipratropium (ATROVENT) 0.03 % nasal spray    Sig: Place 2 sprays into both nostrils every 12 (twelve) hours.    Dispense:  30 mL    Refill:  12    There are no discontinued medications.  Follow-up: Return in about 4 weeks (around 07/16/2022).   Crecencio Mc, MD

## 2022-06-18 NOTE — Assessment & Plan Note (Signed)

## 2022-06-18 NOTE — Assessment & Plan Note (Addendum)
Declines treatment  In favor of dietary and exercise

## 2022-06-18 NOTE — Patient Instructions (Addendum)
[name] , Thank you for taking time to come for your Medicare Wellness Visit. I appreciate your ongoing commitment to your health goals. Please review the following plan we discussed and let me know if I can assist you in the future.   These are the goals we discussed:  Goals      Maintain Healthy Lifestyle     Stay active Healthy diet Healthy weight 97-100lb        This is a list of the screening recommended for you and due dates:  Health Maintenance  Topic Date Due   COVID-19 Vaccine (4 - Pfizer risk series) 07/04/2022*   Flu Shot  11/18/2022*   Tetanus Vaccine  06/19/2023*   Colon Cancer Screening  10/24/2022   Medicare Annual Wellness Visit  06/19/2023   Mammogram  08/10/2023   Pneumonia Vaccine  Completed   DEXA scan (bone density measurement)  Completed   Hepatitis C Screening: USPSTF Recommendation to screen - Ages 81-79 yo.  Completed   Zoster (Shingles) Vaccine  Completed   HPV Vaccine  Aged Out  *Topic was postponed. The date shown is not the original due date.    Advanced directives: End of life planning; Advanced aging; Advanced directives discussed.  No HCPOA/Living Will.  Additional information declined at this time.  Next appointment: Follow up in one year for your annual wellness visit    Preventive Care 65 Years and Older, Female Preventive care refers to lifestyle choices and visits with your health care provider that can promote health and wellness. What does preventive care include? A yearly physical exam. This is also called an annual well check. Dental exams once or twice a year. Routine eye exams. Ask your health care provider how often you should have your eyes checked. Personal lifestyle choices, including: Daily care of your teeth and gums. Regular physical activity. Eating a healthy diet. Avoiding tobacco and drug use. Limiting alcohol use. Practicing safe sex. Taking low-dose aspirin every day. Taking vitamin and mineral supplements as  recommended by your health care provider. What happens during an annual well check? The services and screenings done by your health care provider during your annual well check will depend on your age, overall health, lifestyle risk factors, and family history of disease. Counseling  Your health care provider may ask you questions about your: Alcohol use. Tobacco use. Drug use. Emotional well-being. Home and relationship well-being. Sexual activity. Eating habits. History of falls. Memory and ability to understand (cognition). Work and work Statistician. Reproductive health. Screening  You may have the following tests or measurements: Height, weight, and BMI. Blood pressure. Lipid and cholesterol levels. These may be checked every 5 years, or more frequently if you are over 13 years old. Skin check. Lung cancer screening. You may have this screening every year starting at age 42 if you have a 30-pack-year history of smoking and currently smoke or have quit within the past 15 years. Fecal occult blood test (FOBT) of the stool. You may have this test every year starting at age 22. Flexible sigmoidoscopy or colonoscopy. You may have a sigmoidoscopy every 5 years or a colonoscopy every 10 years starting at age 54. Hepatitis C blood test. Hepatitis B blood test. Sexually transmitted disease (STD) testing. Diabetes screening. This is done by checking your blood sugar (glucose) after you have not eaten for a while (fasting). You may have this done every 1-3 years. Bone density scan. This is done to screen for osteoporosis. You may have this  done starting at age 14. Mammogram. This may be done every 1-2 years. Talk to your health care provider about how often you should have regular mammograms. Talk with your health care provider about your test results, treatment options, and if necessary, the need for more tests. Vaccines  Your health care provider may recommend certain vaccines, such  as: Influenza vaccine. This is recommended every year. Tetanus, diphtheria, and acellular pertussis (Tdap, Td) vaccine. You may need a Td booster every 10 years. Zoster vaccine. You may need this after age 21. Pneumococcal 13-valent conjugate (PCV13) vaccine. One dose is recommended after age 76. Pneumococcal polysaccharide (PPSV23) vaccine. One dose is recommended after age 86. Talk to your health care provider about which screenings and vaccines you need and how often you need them. This information is not intended to replace advice given to you by your health care provider. Make sure you discuss any questions you have with your health care provider. Document Released: 09/02/2015 Document Revised: 04/25/2016 Document Reviewed: 06/07/2015 Elsevier Interactive Patient Education  2017 [place] Prevention in the Home Falls can cause injuries. They can happen to people of all ages. There are many things you can do to make your home safe and to help prevent falls. What can I do on the outside of my home? Regularly fix the edges of walkways and driveways and fix any cracks. Remove anything that might make you trip as you walk through a door, such as a raised step or threshold. Trim any bushes or trees on the path to your home. Use bright outdoor lighting. Clear any walking paths of anything that might make someone trip, such as rocks or tools. Regularly check to see if handrails are loose or broken. Make sure that both sides of any steps have handrails. Any raised decks and porches should have guardrails on the edges. Have any leaves, snow, or ice cleared regularly. Use sand or salt on walking paths during winter. Clean up any spills in your garage right away. This includes oil or grease spills. What can I do in the bathroom? Use night lights. Install grab bars by the toilet and in the tub and shower. Do not use towel bars as grab bars. Use non-skid mats or decals in the tub or  shower. If you need to sit down in the shower, use a plastic, non-slip stool. Keep the floor dry. Clean up any water that spills on the floor as soon as it happens. Remove soap buildup in the tub or shower regularly. Attach bath mats securely with double-sided non-slip rug tape. Do not have throw rugs and other things on the floor that can make you trip. What can I do in the bedroom? Use night lights. Make sure that you have a light by your bed that is easy to reach. Do not use any sheets or blankets that are too big for your bed. They should not hang down onto the floor. Have a firm chair that has side arms. You can use this for support while you get dressed. Do not have throw rugs and other things on the floor that can make you trip. What can I do in the kitchen? Clean up any spills right away. Avoid walking on wet floors. Keep items that you use a lot in easy-to-reach places. If you need to reach something above you, use a strong step stool that has a grab bar. Keep electrical cords out of the way. Do not use floor polish or wax  that makes floors slippery. If you must use wax, use non-skid floor wax. Do not have throw rugs and other things on the floor that can make you trip. What can I do with my stairs? Do not leave any items on the stairs. Make sure that there are handrails on both sides of the stairs and use them. Fix handrails that are broken or loose. Make sure that handrails are as long as the stairways. Check any carpeting to make sure that it is firmly attached to the stairs. Fix any carpet that is loose or worn. Avoid having throw rugs at the top or bottom of the stairs. If you do have throw rugs, attach them to the floor with carpet tape. Make sure that you have a light switch at the top of the stairs and the bottom of the stairs. If you do not have them, ask someone to add them for you. What else can I do to help prevent falls? Wear shoes that: Do not have high heels. Have  rubber bottoms. Are comfortable and fit you well. Are closed at the toe. Do not wear sandals. If you use a stepladder: Make sure that it is fully opened. Do not climb a closed stepladder. Make sure that both sides of the stepladder are locked into place. Ask someone to hold it for you, if possible. Clearly mark and make sure that you can see: Any grab bars or handrails. First and last steps. Where the edge of each step is. Use tools that help you move around (mobility aids) if they are needed. These include: Canes. Walkers. Scooters. Crutches. Turn on the lights when you go into a dark area. Replace any light bulbs as soon as they burn out. Set up your furniture so you have a clear path. Avoid moving your furniture around. If any of your floors are uneven, fix them. If there are any pets around you, be aware of where they are. Review your medicines with your doctor. Some medicines can make you feel dizzy. This can increase your chance of falling. Ask your doctor what other things that you can do to help prevent falls. This information is not intended to replace advice given to you by your health care provider. Make sure you discuss any questions you have with your health care provider. Document Released: 06/02/2009 Document Revised: 01/12/2016 Document Reviewed: 09/10/2014 Elsevier Interactive Patient Education  2017 [employer].

## 2022-06-18 NOTE — Assessment & Plan Note (Signed)
She is managing her anxiety without benzodiazepines

## 2022-06-18 NOTE — Patient Instructions (Addendum)
  You might want to try using Relaxium for insomnia  (as seen on TV commercials) . It contains:  Melatonin 5 mg  Chamomile 25 mg Passionflower extract 75 mg GABA 100 mg Ashwaganda extract 125 mg Magnesium citrate, glycinate, oxide (100 mg)  L tryptophan 500 mg Valerest (proprietary  ingredient ; probably valeria root extract)    Fiber supplements or stool softener are  perfectly safe  to take daily to manage hard stools  Tylenol is safer than motrin or aleve if you need to take something daily for joints  Omeprazole and atrovent nasal spray for the PND (trial one month)   I strongly recommend that you get the TDaP vaccine , as you are overdue .  It is good for ten years for protection against tetanus,  And for lifetime protection against  diptheria and whooping cough

## 2022-06-18 NOTE — Assessment & Plan Note (Signed)
NOTED BY UROLOGIST DURING LAST OV .  Advised patient that it will not be treated unless symptoms of UTI are present

## 2022-06-18 NOTE — Assessment & Plan Note (Signed)
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  rx propranolol for prm use

## 2022-06-18 NOTE — Assessment & Plan Note (Addendum)
Reviewed urology workup. NORMAL CYSTOSCOPY BY DR Erlene Quan  IN MAY 2023

## 2022-06-18 NOTE — Progress Notes (Cosign Needed)
Subjective:   Debbie Sanchez is a 66 y.o. female who presents for an Initial Medicare Annual Wellness Visit.  Review of Systems    No ROS.  Medicare Wellness Virtual Visit.  Visual/audio telehealth visit, UTA vital signs.   See social history for additional risk factors.   Cardiac Risk Factors include: advanced age (>50mn, >>74women)     Objective:    Today's Vitals   06/18/22 0927  BP: 128/64  Pulse: 89  Resp: 15  Temp: 97.7 F (36.5 C)  SpO2: 98%  Weight: 103 lb 3.2 oz (46.8 kg)  Height: '4\' 11"'$  (1.499 m)   Body mass index is 20.84 kg/m.     06/18/2022    9:39 AM 07/01/2019    4:33 PM  Advanced Directives  Does Patient Have a Medical Advance Directive? No No  Would patient like information on creating a medical advance directive? No - Patient declined     Current Medications (verified) Outpatient Encounter Medications as of 06/18/2022  Medication Sig   ALPRAZolam (XANAX) 0.25 MG tablet TAKE ONE-HALF TABLET BY MOUTH AT BEDTIMEAS NEEDED FOR SLEEP   Ascorbic Acid (VITAMIN C) 100 MG tablet Take 1,000 mg by mouth 2 (two) times daily.    Calcium Carbonate-Vit D-Min (CALCIUM 1200 PO) Take 1 tablet by mouth.   Cholecalciferol (VITAMIN D3) 125 MCG (5000 UT) TABS Take 5,000 Units by mouth.   CRANBERRY CONCENTRATE PO Take 1 tablet by mouth.   Cyanocobalamin (VITAMIN B 12) 500 MCG TABS Take by mouth.   MELATONIN ER PO Take by mouth.   Multiple Vitamin (MULTIVITAMIN) tablet Take 1 tablet by mouth daily.   TURMERIC PO Take by mouth.   vitamin E 600 UNIT capsule Take 600 Units by mouth daily.   zinc gluconate 50 MG tablet Take 50 mg by mouth daily.   [DISCONTINUED] cholecalciferol (VITAMIN D3) 25 MCG (1000 UT) tablet Take 1,000 Units by mouth daily.   No facility-administered encounter medications on file as of 06/18/2022.    Allergies (verified) Patient has no known allergies.   History: Past Medical History:  Diagnosis Date   Fibrocystic breast disease    rt  breast aspiration 2002   Hx of dysplastic nevus 03/11/2018   Upper back right paraspinal   Hx of dysplastic nevus 12/19/2016   R inframammary, mild to moderate   Menopause    last menses 2008   Past Surgical History:  Procedure Laterality Date   BREAST BIOPSY Right 2010   cyst aspiration/US guided biopsy, clip placement   BREAST CYST ASPIRATION Right 2002   cyst aspiration   CESAREAN SECTION  1998   MOUTH SURGERY     TUBAL LIGATION     Family History  Problem Relation Age of Onset   Stroke Mother    Cancer Mother 661      colon CA / resected completely 1999   Cancer Maternal Aunt        breast CA   Breast cancer Other    Social History   Socioeconomic History   Marital status: Widowed    Spouse name: Not on file   Number of children: Not on file   Years of education: Not on file   Highest education level: Not on file  Occupational History   Not on file  Tobacco Use   Smoking status: Never   Smokeless tobacco: Never  Substance and Sexual Activity   Alcohol use: Yes    Comment: rare   Drug use:  No   Sexual activity: Not on file  Other Topics Concern   Not on file  Social History Narrative   Not on file   Social Determinants of Health   Financial Resource Strain: Low Risk  (06/18/2022)   Overall Financial Resource Strain (CARDIA)    Difficulty of Paying Living Expenses: Not hard at all  Food Insecurity: No Food Insecurity (06/18/2022)   Hunger Vital Sign    Worried About Running Out of Food in the Last Year: Never true    Ran Out of Food in the Last Year: Never true  Transportation Needs: No Transportation Needs (06/18/2022)   PRAPARE - Hydrologist (Medical): No    Lack of Transportation (Non-Medical): No  Physical Activity: Not on file  Stress: Stress Concern Present (06/18/2022)   [place]    Feeling of Stress : To some extent  Social Connections: Unknown  (06/18/2022)   Social Connection and Isolation Panel [NHANES]    Frequency of Communication with Friends and Family: More than three times a week    Frequency of Social Gatherings with Friends and Family: More than three times a week    Attends Religious Services: Not on Advertising copywriter or Organizations: Not on file    Attends Archivist Meetings: Not on file    Marital Status: Not on file    Tobacco Counseling Counseling given: Not Answered   Clinical Intake:  Pre-visit preparation completed: Yes        Diabetes: No  How often do you need to have someone help you when you read instructions, pamphlets, or other written materials from your doctor or pharmacy?: 1 - Never  Interpreter Needed?: No      Activities of Daily Living    06/18/2022    9:37 AM  In your present state of health, do you have any difficulty performing the following activities:  Hearing? 0  Vision? 0  Difficulty concentrating or making decisions? 0  Walking or climbing stairs? 0  Dressing or bathing? 0  Doing errands, shopping? 0  Preparing Food and eating ? N  Using the Toilet? N  In the past six months, have you accidently leaked urine? N  Do you have problems with loss of bowel control? N  Managing your Medications? N  Managing your Finances? N  Housekeeping or managing your Housekeeping? N    Patient Care Team: Crecencio Mc, MD as PCP - General (Internal Medicine) Crecencio Mc, MD (Internal Medicine)  Indicate any recent Medical Services you may have received from other than [facility] providers in the past year (date may be approximate).     Assessment:   This is a routine wellness examination for [name].  Hearing/Vision screen Hearing Screening - Comments:: Patient is able to hear conversational tones without difficulty.  No issues reported.   Vision Screening - Comments:: Followed by [facility] Wears corrective lenses They have seen their  ophthalmologist in the last 12 months.    Dietary issues and exercise activities discussed: Current Exercise Habits: Home exercise routine, Type of exercise: stretching;strength training/weights (Water aerobics), Time (Minutes): 60, Frequency (Times/Week): 5, Weekly Exercise (Minutes/Week): 300, Intensity: Moderate  Healthy diet; lean meats, vegetables Good water intake   Goals Addressed             This Visit's Progress    Maintain Healthy Lifestyle  Stay active Healthy diet Healthy weight 97-100lb       Depression Screen    06/18/2022    9:36 AM 06/16/2021   10:06 AM 06/16/2020   10:55 AM 06/15/2019    2:34 PM 06/07/2017    4:21 PM  PHQ 2/9 Scores  PHQ - 2 Score 0 0 0 2 0  PHQ- 9 Score   '2 5 1    '$ Fall Risk    06/18/2022    9:38 AM 08/07/2021    1:16 PM 06/16/2021   10:06 AM 06/16/2020   10:04 AM 06/06/2020    2:15 PM  Fall Risk   Falls in the past year? 0 0 0 0 0  Number falls in past yr: 0      Injury with Fall? 0      Risk for fall due to : No Fall Risks No Fall Risks No Fall Risks    Follow up Falls evaluation completed Falls evaluation completed Falls evaluation completed Falls evaluation completed Falls evaluation completed    [place]: Home free of loose throw rugs in walkways, pet beds, electrical cords, etc? Yes  Adequate lighting in your home to reduce risk of falls? Yes   ASSISTIVE DEVICES UTILIZED TO PREVENT FALLS: Life alert? No  Use of a cane, walker or w/c? No  Grab bars in the bathroom? No   TIMED UP AND GO: Was the test performed? Yes .  Length of time to ambulate 10 feet: 10 sec.   Gait steady and fast without use of assistive device  Cognitive Function:        06/18/2022    9:45 AM 06/16/2021   11:01 AM  6CIT Screen  What Year? 0 points 0 points  What month? 0 points 0 points  What time? 0 points 0 points  Count back from 20 0 points 0 points  Months in reverse 0 points 0 points   Repeat phrase 0 points 0 points  Total Score 0 points 0 points    Immunizations Immunization History  Administered Date(s) Administered   Fluad Quad(high Dose 65+) 06/16/2021   Influenza,inj,Quad PF,6+ Mos 06/15/2019, 06/16/2020   Influenza-Unspecified 05/20/2012, 06/13/2016, 05/29/2017, 06/05/2018   PFIZER(Purple Top)SARS-COV-2 Vaccination 11/24/2019, 12/15/2019   PNEUMOCOCCAL CONJUGATE-20 06/16/2021   Pfizer Covid-19 Vaccine Bivalent Booster 15yr & up 05/15/2021   Tdap 03/03/2010   Zoster Recombinat (Shingrix) 09/19/2020, 01/12/2021    TDAP status: Due, Education has been provided regarding the importance of this vaccine. Advised may receive this vaccine at local pharmacy or Health Dept. Aware to provide a copy of the vaccination record if obtained from local pharmacy or Health Dept. Verbalized acceptance and understanding.  Flu Vaccine status: Due, Education has been provided regarding the importance of this vaccine. Advised may receive this vaccine at local pharmacy or Health Dept. Aware to provide a copy of the vaccination record if obtained from local pharmacy or Health Dept. Verbalized acceptance and understanding.  Covid-19 vaccine status: Completed vaccines x3.  Screening Tests Health Maintenance  Topic Date Due   COVID-19 Vaccine (4 - Pfizer risk series) 07/04/2022 (Originally 07/10/2021)   INFLUENZA VACCINE  11/18/2022 (Originally 03/20/2022)   TETANUS/TDAP  06/19/2023 (Originally 03/03/2020)   COLONOSCOPY (Pts [phone] coverage will need to be confirmed)  10/24/2022   Medicare Annual Wellness (AWV)  06/19/2023   MAMMOGRAM  08/10/2023   Pneumonia Vaccine 6572Years old  Completed   DEXA SCAN  Completed   Hepatitis C Screening  Completed   Zoster Vaccines- Shingrix  Completed   HPV VACCINES  Aged Out    Health Maintenance There are no preventive care reminders to display for this patient.  Lung Cancer Screening: (Low Dose CT Chest recommended if Age 49-80  years, 30 pack-year currently smoking OR have quit w/in 15years.) does not qualify.   Vision Screening: Recommended annual ophthalmology exams for early detection of glaucoma and other disorders of the eye.  Dental Screening: Recommended annual dental exams for proper oral hygiene  Community Resource Referral / Chronic Care Management: CRR required this visit?  No   CCM required this visit?  No      Plan:     I have personally reviewed and noted the following in the patient's chart:   Medical and social history Use of alcohol, tobacco or illicit drugs  Current medications and supplements including opioid prescriptions. Patient is not currently taking opioid prescriptions. Functional ability and status Nutritional status Physical activity Advanced directives List of other physicians Hospitalizations, surgeries, and ER visits in previous 12 months Vitals Screenings to include cognitive, depression, and falls Referrals and appointments  In addition, I have reviewed and discussed with patient certain preventive protocols, quality metrics, and best practice recommendations. A written personalized care plan for preventive services as well as general preventive health recommendations were provided to patient.     [place], LPN   34/19/6222      I have reviewed the above information and agree with above.   Deborra Medina, MD

## 2022-06-23 LAB — EHRLICHIA ANTIBODY PANEL
E. CHAFFEENSIS AB IGG: <1:64 {titer}
E. CHAFFEENSIS AB IGM: <1:20 {titer}

## 2022-06-23 LAB — ROCKY MTN SPOTTED FVR ABS PNL(IGG+IGM)
RMSF IgG: NOT DETECTED
RMSF IgM: NOT DETECTED

## 2022-07-16 ENCOUNTER — Ambulatory Visit (INDEPENDENT_AMBULATORY_CARE_PROVIDER_SITE_OTHER): Payer: Medicare HMO | Admitting: Internal Medicine

## 2022-07-16 ENCOUNTER — Encounter: Payer: Self-pay | Admitting: Internal Medicine

## 2022-07-16 VITALS — BP 120/82 | HR 81 | Temp 98.4°F | Ht 59.0 in | Wt 101.6 lb

## 2022-07-16 DIAGNOSIS — H9313 Tinnitus, bilateral: Secondary | ICD-10-CM | POA: Diagnosis not present

## 2022-07-16 DIAGNOSIS — N3281 Overactive bladder: Secondary | ICD-10-CM

## 2022-07-16 DIAGNOSIS — F5101 Primary insomnia: Secondary | ICD-10-CM | POA: Diagnosis not present

## 2022-07-16 DIAGNOSIS — G609 Hereditary and idiopathic neuropathy, unspecified: Secondary | ICD-10-CM

## 2022-07-16 DIAGNOSIS — G250 Essential tremor: Secondary | ICD-10-CM

## 2022-07-16 DIAGNOSIS — J300 Vasomotor rhinitis: Secondary | ICD-10-CM

## 2022-07-16 DIAGNOSIS — R319 Hematuria, unspecified: Secondary | ICD-10-CM

## 2022-07-16 NOTE — Progress Notes (Unsigned)
Subjective:  Patient ID: Debbie Sanchez, female    DOB: 04-15-1956  Age: 66 y.o. MRN: [id number]  CC: There were no encounter diagnoses.   HPI Debbie Sanchez presents for  Chief Complaint  Patient presents with   Follow-up    1 month follow up on trial of omeprazole and atrovent nasal spray for post nasal drip.    1) Chronic PND :  has had allergy testing by ENT. (Dust mites and cockroaches)   Constant throat clearing .  No change with flonase. Has tried saline irrgation in the past without help.   told she had reflux changes on ENT she was prescribed omeprazole and atrovent , which she felt made her symptoms worse .  (Stopped atrovent)   and did not help reflux so she has been advised to stop the reflux medication    2) BET:  worse in the morning but mild. 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 Only noticeable at night. .  No history of recurrent vertigo (last episode  > 10 years ago, 2 months ago had mild orthostasis/light headedness )    4) mild neuropathy fot the past the years  hands and feet  no change   5) overactive bladder;  stopped the acupuncture bc it has not helped. Has 2 nighttime voids.  Voids every hour during the day .  Does not want medication trial   prior ineffective trial of detrol ,  was concerned and stopped myrbetriq   6) taking cranberry tablets  no recent UTI's.  Reviewed cystoscopy,    7) osteoporosis:  taking calcium and vitamin D .  Doing water aerobics 5 days per week    8) disrupted sleep :  has tried low dose alprazolam ,  stopped the medication keeping a food and activity level , higher anxiety levels and overeating noted to be causative. . Using .  Has not tried relaxium  yet but has  ordered it      Outpatient Medications Prior to Visit  Medication Sig Dispense Refill   ALPRAZolam (XANAX) 0.25 MG tablet TAKE ONE-HALF TABLET BY MOUTH AT BEDTIMEAS NEEDED FOR SLEEP 15 tablet 5   Ascorbic Acid  (VITAMIN C) 100 MG tablet Take 1,000 mg by mouth 2 (two) times daily.      Calcium Carbonate-Vit D-Min (CALCIUM 1200 PO) Take 1 tablet by mouth.     Cholecalciferol (VITAMIN D3) 125 MCG (5000 UT) TABS Take 5,000 Units by mouth.     CRANBERRY CONCENTRATE PO Take 1 tablet by mouth.     Cyanocobalamin (VITAMIN B 12) 500 MCG TABS Take by mouth.     MELATONIN ER PO Take by mouth.     Multiple Vitamin (MULTIVITAMIN) tablet Take 1 tablet by mouth daily.     omeprazole (PRILOSEC) 20 MG capsule Take 1 capsule (20 mg total) by mouth daily. 30 capsule 3   propranolol (INDERAL) 10 MG tablet Take 1 tablet (10 mg total) by mouth 3 (three) times daily. As needed for rapid heart rate 30 tablet 0   TURMERIC PO Take by mouth.     vitamin E 600 UNIT capsule Take 600 Units by mouth daily.     zinc gluconate 50 MG tablet Take 50 mg by mouth daily.     ipratropium (ATROVENT) 0.03 % nasal spray Place 2 sprays into both nostrils every 12 (twelve) hours. (Patient not taking: Reported on 07/16/2022) 30 mL 12  No facility-administered medications prior to visit.    Review of Systems;  Patient denies headache, fevers, malaise, unintentional weight loss, skin rash, eye pain, sinus congestion and sinus pain, sore throat, dysphagia,  hemoptysis , cough, dyspnea, wheezing, chest pain, palpitations, orthopnea, edema, abdominal pain, nausea, melena, diarrhea, constipation, flank pain, dysuria, hematuria, urinary  Frequency, nocturia, numbness, tingling, seizures,  Focal weakness, Loss of consciousness,  Tremor, insomnia, depression, anxiety, and suicidal ideation.      Objective:  BP 120/82 (BP Location: Left Arm, Patient Position: Sitting, Cuff Size: Normal)   Pulse 81   Temp 98.4 F (36.9 C) (Oral)   Ht '4\' 11"'$  (1.499 m)   Wt 101 lb 9.6 oz (46.1 kg)   SpO2 97%   BMI 20.52 kg/m   BP Readings from Last 3 Encounters:  07/16/22 120/82  06/18/22 128/64  06/18/22 128/64    Wt Readings from Last 3 Encounters:   07/16/22 101 lb 9.6 oz (46.1 kg)  06/18/22 103 lb (46.7 kg)  06/18/22 103 lb 3.2 oz (46.8 kg)    General appearance: alert, cooperative and appears stated age Ears: normal TM's and external ear canals both ears Throat: lips, mucosa, and tongue normal; teeth and gums normal Neck: no adenopathy, no carotid bruit, supple, symmetrical, trachea midline and thyroid not enlarged, symmetric, no tenderness/mass/nodules Back: symmetric, no curvature. ROM normal. No CVA tenderness. Lungs: clear to auscultation bilaterally Heart: regular rate and rhythm, S1, S2 normal, no murmur, click, rub or gallop Abdomen: soft, non-tender; bowel sounds normal; no masses,  no organomegaly Pulses: 2+ and symmetric Skin: Skin color, texture, turgor normal. No rashes or lesions Lymph nodes: Cervical, supraclavicular, and axillary nodes normal. Neuro:  awake and interactive with normal mood and affect. Higher cortical functions are normal. Speech is clear without word-finding difficulty or dysarthria. Extraocular movements are intact. Visual fields of both eyes are grossly intact. Sensation to light touch is grossly intact bilaterally of upper and lower extremities. Motor examination shows 4+/5 symmetric hand grip and upper extremity and 5/5 lower extremity strength. There is no pronation or drift. Gait is non-ataxic   Lab Results  Component Value Date   HGBA1C 6.2 06/18/2022   HGBA1C 6.0 06/06/2020   HGBA1C 5.7 (H) 07/03/2019    Lab Results  Component Value Date   CREATININE 0.52 06/18/2022   CREATININE 0.80 01/02/2022   CREATININE 0.61 06/16/2021    Lab Results  Component Value Date   WBC 6.8 06/18/2022   HGB 13.4 06/18/2022   HCT 40.6 06/18/2022   PLT 256.0 06/18/2022   GLUCOSE 99 06/18/2022   CHOL 203 (H) 06/18/2022   TRIG 121.0 06/18/2022   HDL 65.90 06/18/2022   LDLDIRECT 123.0 06/18/2022   LDLCALC 113 (H) 06/18/2022   ALT 34 06/18/2022   AST 28 06/18/2022   NA 141 06/18/2022   K 4.2  06/18/2022   CL 103 06/18/2022   CREATININE 0.52 06/18/2022   BUN 19 06/18/2022   CO2 31 06/18/2022   TSH 1.24 06/18/2022   HGBA1C 6.2 06/18/2022    CT HEMATURIA WORKUP  Result Date: 01/03/2022 CLINICAL DATA:  Gross hematuria, multiple UTIs EXAM: CT ABDOMEN AND PELVIS WITHOUT AND WITH CONTRAST TECHNIQUE: Multidetector CT imaging of the abdomen and pelvis was performed following the standard protocol before and following the bolus administration of intravenous contrast. RADIATION DOSE REDUCTION: This exam was performed according to the departmental dose-optimization program which includes automated exposure control, adjustment of the mA and/or kV according to patient size and/or use  of iterative reconstruction technique. CONTRAST:  35m OMNIPAQUE IOHEXOL 350 MG/ML SOLN COMPARISON:  10/25/2017 FINDINGS: Lower chest: No acute abnormality. Hepatobiliary: Stable, definitively benign small hemangiomata, largest of the lateral right lobe of the liver with peripheral nodular enhancement, hepatic segment VI, measuring 2.9 x 2.5 cm (series 9, image 23). No further follow-up or characterization is required. No solid liver abnormality is seen. No gallstones, gallbladder wall thickening, or biliary dilatation. Pancreas: Unremarkable. No pancreatic ductal dilatation or surrounding inflammatory changes. Spleen: Normal in size without significant abnormality. Adrenals/Urinary Tract: Adrenal glands are unremarkable. Small nonobstructive calculus of the inferior pole of the right kidney. No left-sided calculi, ureteral calculi, or hydronephrosis. No urinary tract filling defect on delayed phase imaging. Bladder is unremarkable. Stomach/Bowel: Stomach is within normal limits. Appendix is not clearly visualized and may be surgically absent. No evidence of bowel wall thickening, distention, or inflammatory changes. Large burden of stool throughout the colon and rectum. Vascular/Lymphatic: No significant vascular findings are  present. No enlarged abdominal or pelvic lymph nodes. Reproductive: No mass or other significant abnormality. Prominent bilateral uterine and adnexal varices (series 9, image 61). Other: No abdominal wall hernia or abnormality. No ascites. Musculoskeletal: No acute or significant osseous findings. IMPRESSION: 1. Small nonobstructive calculus of the inferior pole of the right kidney. No left-sided calculi, ureteral calculi, or hydronephrosis. 2. No urinary tract mass, suspicious contrast enhancement, or filling defect on delayed phase imaging. 3. Prominent bilateral uterine and adnexal varices, which can be seen in pelvic congestion syndrome. Correlate for referable clinical symptoms, if present. Electronically Signed   By: ADelanna AhmadiM.D.   On: 01/03/2022 15:02    Assessment & Plan:   Problem List Items Addressed This Visit   None   I spent a total of   minutes with this patient in a face to face visit on the date of this encounter reviewing the last office visit with me in       ,  most recent visit with cardiology ,    ,  patient's diet and exercise habits, home blood pressure /blod sugar readings, recent ER visit including labs and imaging studies ,   and post visit ordering of testing and therapeutics.    Follow-up: No follow-ups on file.   TCrecencio Mc MD

## 2022-07-16 NOTE — Patient Instructions (Signed)
I recommend one more trial of atrovent now that you have recovered from your viral illness    Bladder agents for OAB:  Ditropan Detrol  [facility] Vesicare Myrbe

## 2022-07-17 DIAGNOSIS — N3281 Overactive bladder: Secondary | ICD-10-CM | POA: Insufficient documentation

## 2022-07-17 DIAGNOSIS — H9313 Tinnitus, bilateral: Secondary | ICD-10-CM | POA: Insufficient documentation

## 2022-07-17 DIAGNOSIS — J300 Vasomotor rhinitis: Secondary | ICD-10-CM | POA: Insufficient documentation

## 2022-07-17 DIAGNOSIS — G4700 Insomnia, unspecified: Secondary | ICD-10-CM | POA: Insufficient documentation

## 2022-07-17 NOTE — Assessment & Plan Note (Signed)
Encouraged to repeat a trial of atrovent nasal spray

## 2022-07-17 NOTE — Assessment & Plan Note (Signed)
Reviewed urology workup. NORMAL CYSTOSCOPY BY DR Erlene Quan  IN MAY 2023.  Urology has recommended repeat workup in 2-3 years.

## 2022-07-17 NOTE — Assessment & Plan Note (Signed)
Aggravated by family events.  Recommend trial of Relaxium

## 2022-07-17 NOTE — Assessment & Plan Note (Signed)
Chronic,  since age 65.  Not accompanied by hearing loss or visual phenomena

## 2022-07-17 NOTE — Assessment & Plan Note (Signed)
Screening labs failed to yield a reversible cause

## 2022-07-17 NOTE — Assessment & Plan Note (Signed)
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  She defers medication

## 2022-07-17 NOTE — Assessment & Plan Note (Signed)
She voids nearly every hour while awake but is apprehensive about using anticholinergic agents to mitigate symptoms.

## 2022-08-10 ENCOUNTER — Ambulatory Visit
Admission: RE | Admit: 2022-08-10 | Discharge: 2022-08-10 | Disposition: A | Discharge status: Home / Self Care | Payer: Medicare HMO | Source: Ambulatory Visit | Attending: Internal Medicine | Admitting: Internal Medicine

## 2022-08-10 DIAGNOSIS — Z1231 Encounter for screening mammogram for malignant neoplasm of breast: Secondary | ICD-10-CM | POA: Insufficient documentation

## 2022-11-02 ENCOUNTER — Encounter: Payer: Self-pay | Admitting: Internal Medicine

## 2022-11-02 DIAGNOSIS — Z1211 Encounter for screening for malignant neoplasm of colon: Secondary | ICD-10-CM

## 2022-11-05 ENCOUNTER — Other Ambulatory Visit: Payer: Self-pay | Admitting: Internal Medicine

## 2022-12-17 IMAGING — MG MM DIGITAL SCREENING BILAT W/ TOMO AND CAD
8 series · 8 of 24 positions shown · non-contrast
Comparison: Previous exam(s).

CLINICAL DATA: Screening.

EXAM:
DIGITAL SCREENING BILATERAL MAMMOGRAM WITH TOMOSYNTHESIS AND CAD
TECHNIQUE: Bilateral screening digital craniocaudal and mediolateral oblique
mammograms were obtained. Bilateral screening digital breast
tomosynthesis was performed. The images were evaluated with
computer-aided detection.

[L MLO synth-2D]
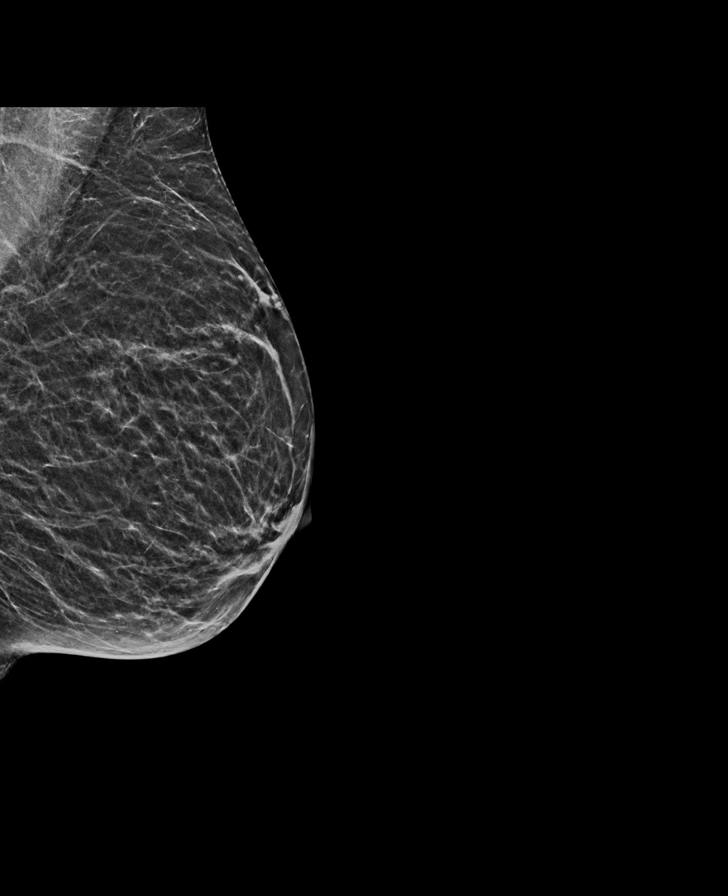

[L CC synth-2D]
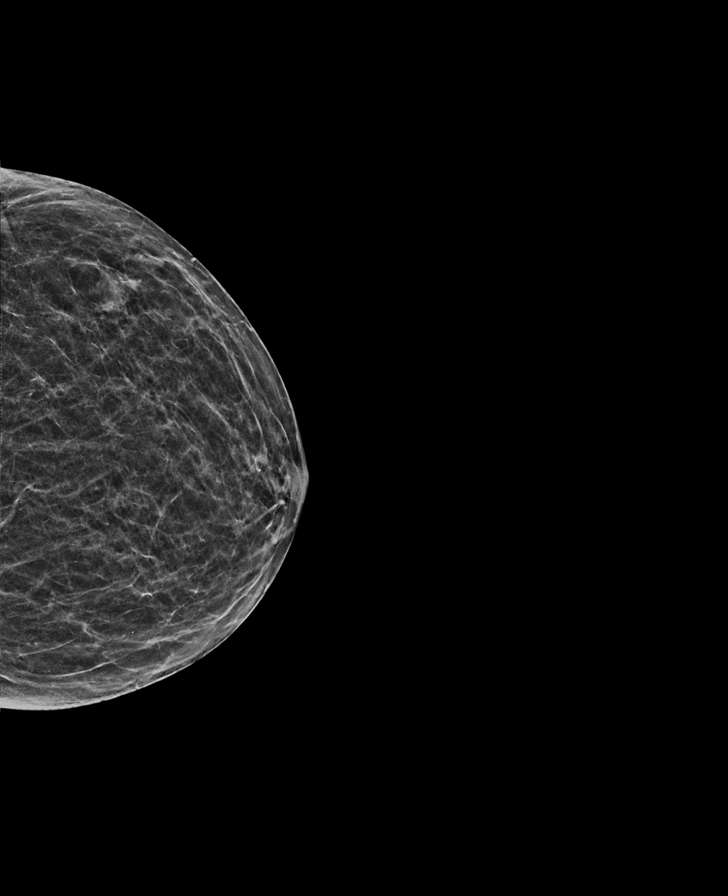

[R CC synth-2D]
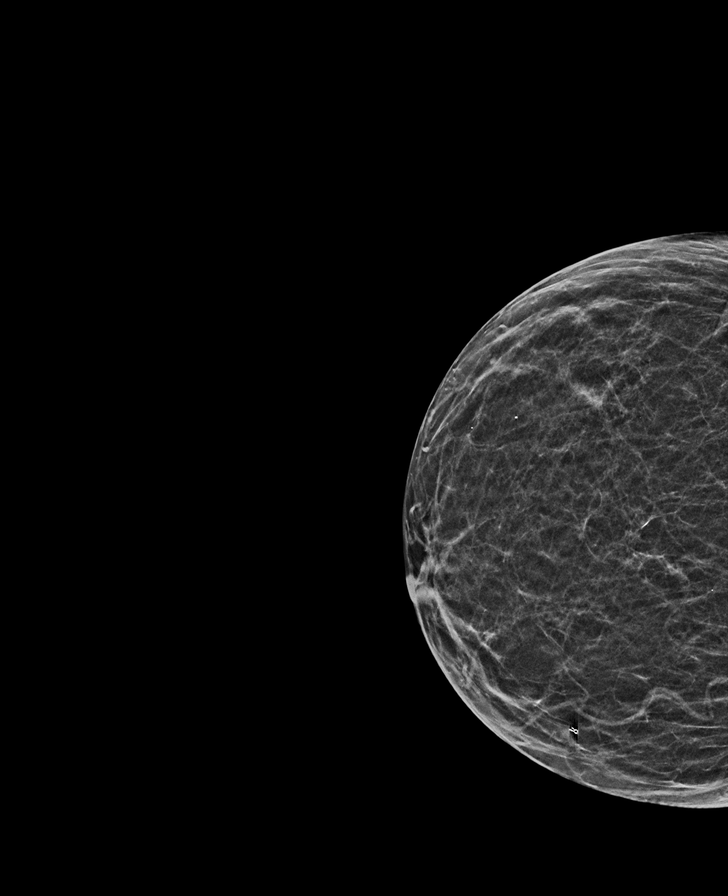

[R MLO synth-2D]
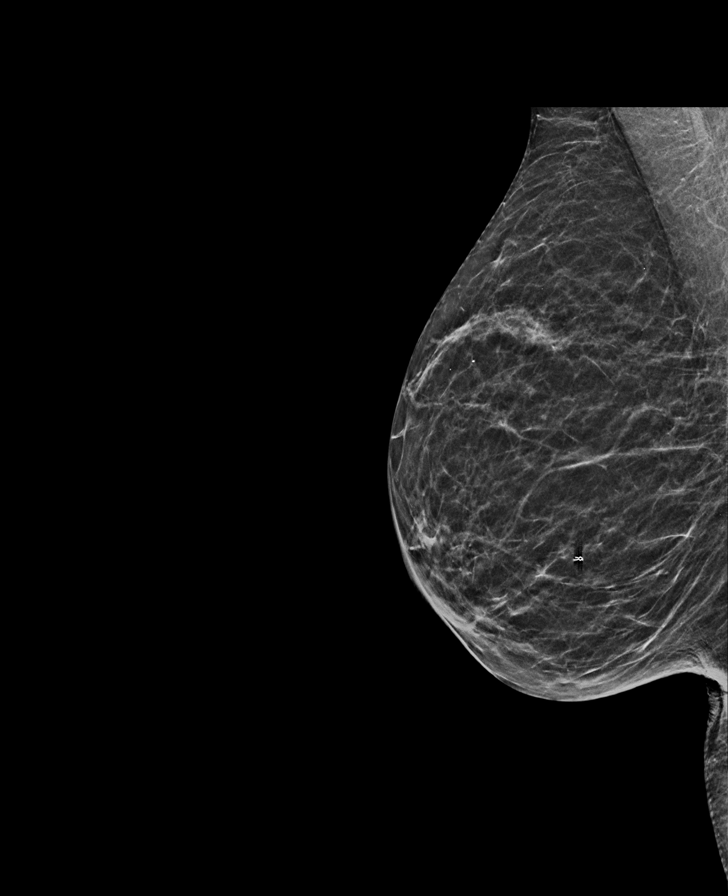

[L CC tomo · tomo slice 22/43.0]
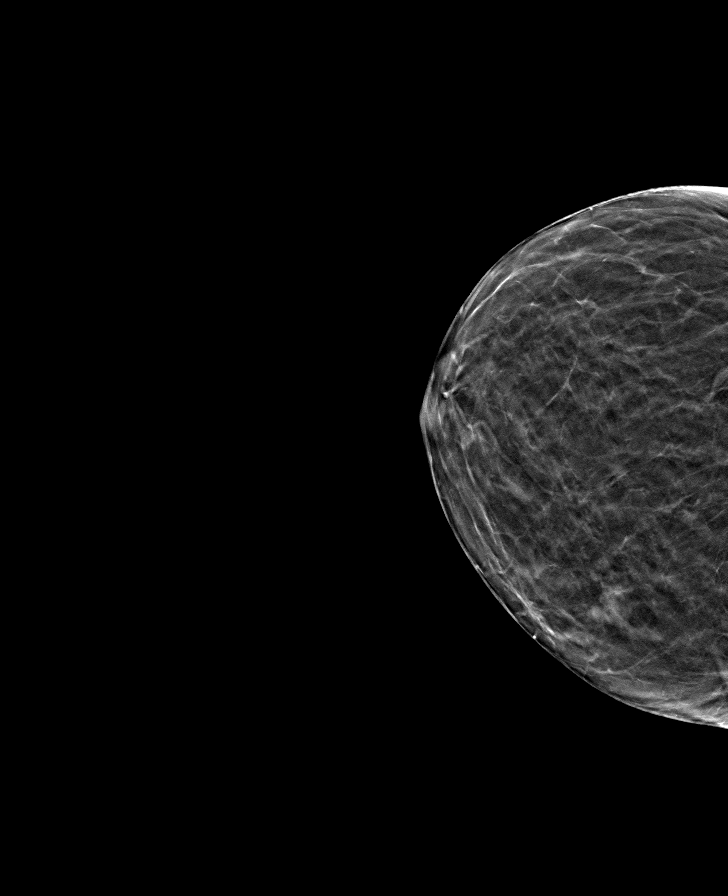

[R CC tomo · tomo slice 21/40.0]
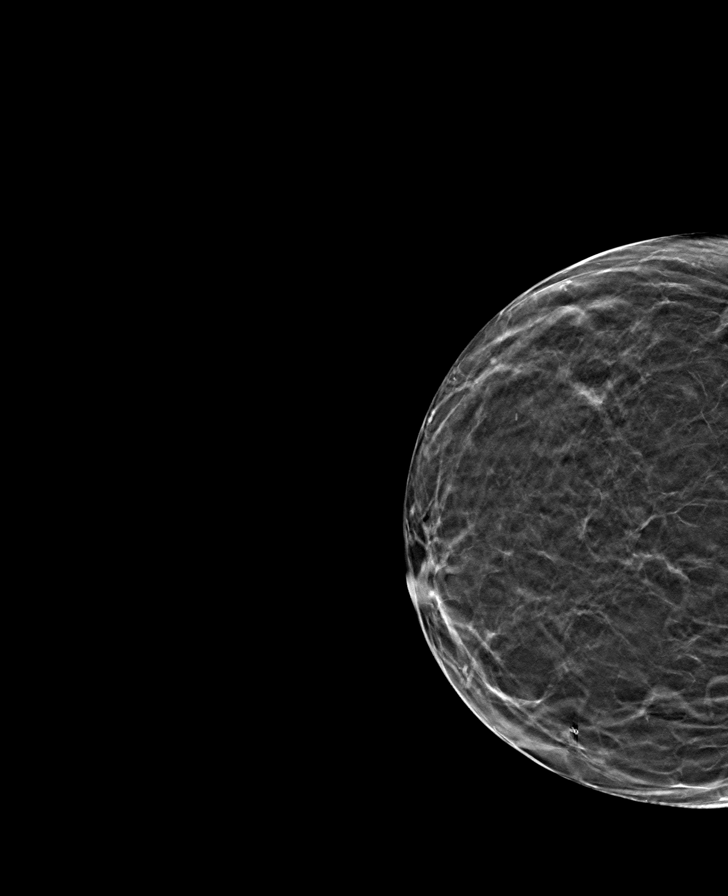

[L MLO tomo · tomo slice 23/45.0]
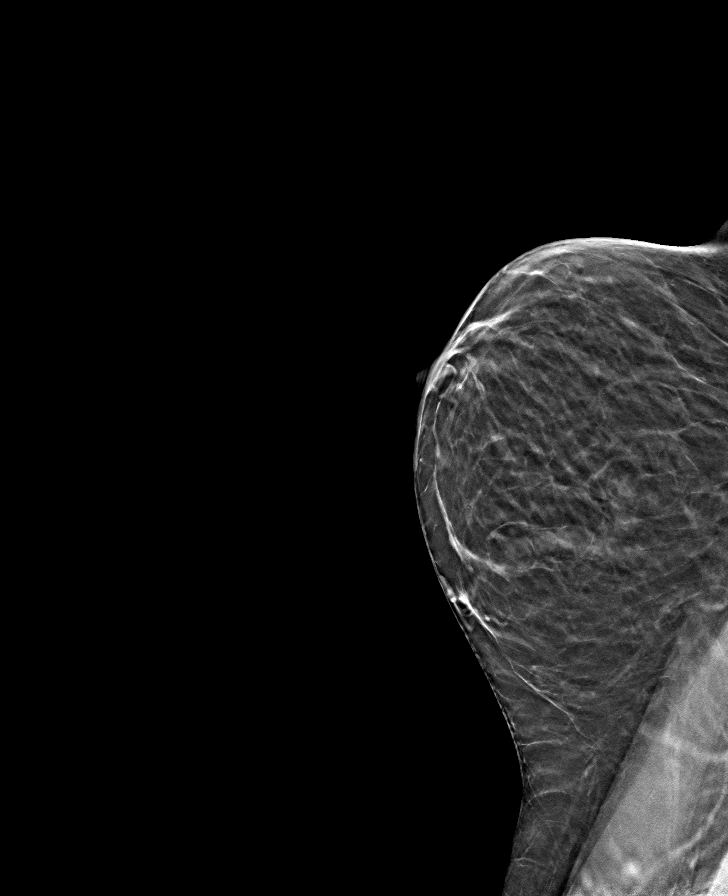

[R MLO tomo · tomo slice 22/43.0]
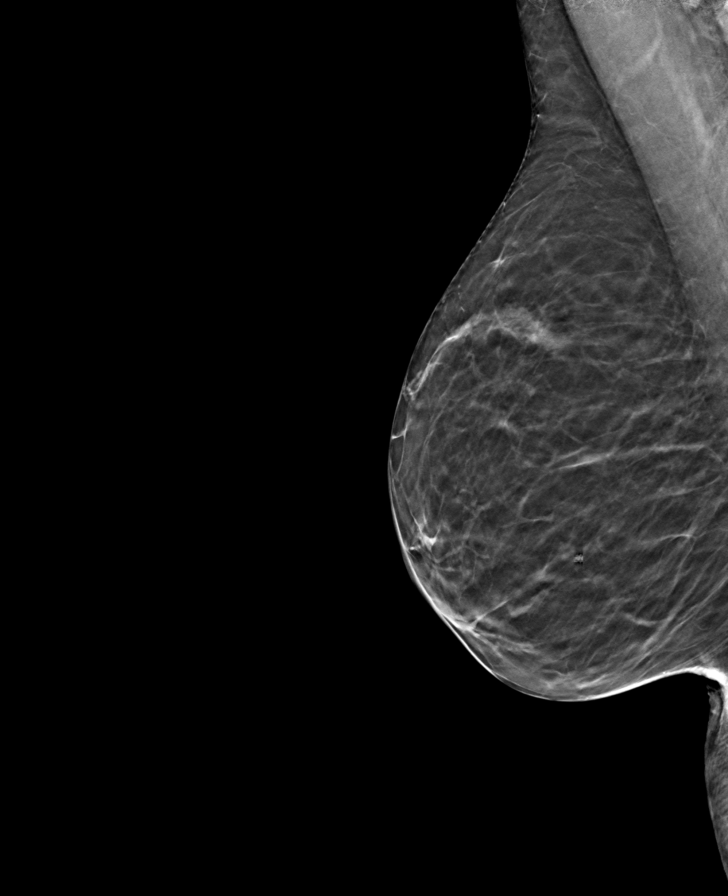

[8 of 24 positions shown; findings below may reference images not displayed]

ACR Breast Density Category b: There are scattered areas of
fibroglandular density.
FINDINGS: There are no findings suspicious for malignancy.
IMPRESSION: No mammographic evidence of malignancy. A result letter of this
screening mammogram will be mailed directly to the patient.

RECOMMENDATION:
Screening mammogram in one year. (Code:51-O-LD2)

BI-RADS CATEGORY  1: Negative.

## 2023-01-10 ENCOUNTER — Ambulatory Visit: Payer: Medicare HMO | Admitting: Dermatology

## 2023-01-10 VITALS — BP 109/74 | HR 93

## 2023-01-10 DIAGNOSIS — L578 Other skin changes due to chronic exposure to nonionizing radiation: Secondary | ICD-10-CM

## 2023-01-10 DIAGNOSIS — L821 Other seborrheic keratosis: Secondary | ICD-10-CM | POA: Diagnosis not present

## 2023-01-10 DIAGNOSIS — Z86018 Personal history of other benign neoplasm: Secondary | ICD-10-CM

## 2023-01-10 DIAGNOSIS — D1801 Hemangioma of skin and subcutaneous tissue: Secondary | ICD-10-CM

## 2023-01-10 DIAGNOSIS — L814 Other melanin hyperpigmentation: Secondary | ICD-10-CM

## 2023-01-10 DIAGNOSIS — D229 Melanocytic nevi, unspecified: Secondary | ICD-10-CM

## 2023-01-10 DIAGNOSIS — W908XXA Exposure to other nonionizing radiation, initial encounter: Secondary | ICD-10-CM

## 2023-01-10 DIAGNOSIS — L719 Rosacea, unspecified: Secondary | ICD-10-CM

## 2023-01-10 DIAGNOSIS — Z7189 Other specified counseling: Secondary | ICD-10-CM

## 2023-01-10 DIAGNOSIS — Z1283 Encounter for screening for malignant neoplasm of skin: Secondary | ICD-10-CM

## 2023-01-10 DIAGNOSIS — X32XXXA Exposure to sunlight, initial encounter: Secondary | ICD-10-CM

## 2023-01-10 NOTE — Patient Instructions (Addendum)
     Melanoma ABCDEs  Melanoma is the most dangerous type of skin cancer, and is the leading cause of death from skin disease.  You are more likely to develop melanoma if you: Have light-colored skin, light-colored eyes, or red or blond hair Spend a lot of time in the sun Tan regularly, either outdoors or in a tanning bed Have had blistering sunburns, especially during childhood Have a close family member who has had a melanoma Have atypical moles or large birthmarks  Early detection of melanoma is key since treatment is typically straightforward and cure rates are extremely high if we catch it early.   The first sign of melanoma is often a change in a mole or a new dark spot.  The ABCDE system is a way of remembering the signs of melanoma.  A for asymmetry:  The two halves do not match. B for border:  The edges of the growth are irregular. C for color:  A mixture of colors are present instead of an even brown color. D for diameter:  Melanomas are usually (but not always) greater than 6mm - the size of a pencil eraser. E for evolution:  The spot keeps changing in size, shape, and color.  Please check your skin once per month between visits. You can use a small mirror in front and a large mirror behind you to keep an eye on the back side or your body.   If you see any new or changing lesions before your next follow-up, please call to schedule a visit.  Please continue daily skin protection including broad spectrum sunscreen SPF 30+ to sun-exposed areas, reapplying every 2 hours as needed when you're outdoors.   Staying in the shade or wearing long sleeves, sun glasses (UVA+UVB protection) and wide brim hats (4-inch brim around the entire circumference of the hat) are also recommended for sun protection.    Due to recent changes in healthcare laws, you may see results of your pathology and/or laboratory studies on MyChart before the doctors have had a chance to review them. We  understand that in some cases there may be results that are confusing or concerning to you. Please understand that not all results are received at the same time and often the doctors may need to interpret multiple results in order to provide you with the best plan of care or course of treatment. Therefore, we ask that you please give us 2 business days to thoroughly review all your results before contacting the office for clarification. Should we see a critical lab result, you will be contacted sooner.   If You Need Anything After Your Visit  If you have any questions or concerns for your doctor, please call our main line at [phone] and press option 4 to reach your doctor's medical assistant. If no one answers, please leave a voicemail as directed and we will return your call as soon as possible. Messages left after 4 pm will be answered the following business day.   You may also send us a message via MyChart. We typically respond to MyChart messages within 1-2 business days.  For prescription refills, please ask your pharmacy to contact our office. Our fax number is [phone].  If you have an urgent issue when the clinic is closed that cannot wait until the next business day, you can page your doctor at the number below.    Please note that while we do our best to be available for urgent issues   outside of office hours, we are not available 24/7.   If you have an urgent issue and are unable to reach us, you may choose to seek medical care at your doctor's office, retail clinic, urgent care center, or emergency room.  If you have a medical emergency, please immediately call 911 or go to the emergency department.  Pager Numbers  - Dr. Kowalski: [phone]  - Dr. Moye: [phone]  - Dr. Stewart: [phone]  In the event of inclement weather, please call our main line at [phone] for an update on the status of any delays or closures.  Dermatology Medication Tips: Please  keep the boxes that topical medications come in in order to help keep track of the instructions about where and how to use these. Pharmacies typically print the medication instructions only on the boxes and not directly on the medication tubes.   If your medication is too expensive, please contact our office at [phone] option 4 or send us a message through MyChart.   We are unable to tell what your co-pay for medications will be in advance as this is different depending on your insurance coverage. However, we may be able to find a substitute medication at lower cost or fill out paperwork to get insurance to cover a needed medication.   If a prior authorization is required to get your medication covered by your insurance company, please allow us 1-2 business days to complete this process.  Drug prices often vary depending on where the prescription is filled and some pharmacies may offer cheaper prices.  The website www.goodrx.com contains coupons for medications through different pharmacies. The prices here do not account for what the cost may be with help from insurance (it may be cheaper with your insurance), but the website can give you the price if you did not use any insurance.  - You can print the associated coupon and take it with your prescription to the pharmacy.  - You may also stop by our office during regular business hours and pick up a GoodRx coupon card.  - If you need your prescription sent electronically to a different pharmacy, notify our office through [facility] MyChart or by phone at [phone] option 4.     Si Usted Necesita Algo Despus de Su Visita  Tambin puede enviarnos un mensaje a travs de MyChart. Por lo general respondemos a los mensajes de MyChart en el transcurso de 1 a 2 das hbiles.  Para renovar recetas, por favor pida a su farmacia que se ponga en contacto con nuestra oficina. Nuestro nmero de fax es el [phone].  Si tiene un asunto urgente  cuando la clnica est cerrada y que no puede esperar hasta el siguiente da hbil, puede llamar/localizar a su doctor(a) al nmero que aparece a continuacin.   Por favor, tenga en cuenta que aunque hacemos todo lo posible para estar disponibles para asuntos urgentes fuera del horario de oficina, no estamos disponibles las 24 horas del da, los 7 das de la semana.   Si tiene un problema urgente y no puede comunicarse con nosotros, puede optar por buscar atencin mdica  en el consultorio de su doctor(a), en una clnica privada, en un centro de atencin urgente o en una sala de emergencias.  Si tiene una emergencia mdica, por favor llame inmediatamente al 911 o vaya a la sala de emergencias.  Nmeros de bper  - Dr. Kowalski: [phone]  - Dra. Moye: [phone]  - Dra. Stewart: [phone]  En caso   de inclemencias del tiempo, por favor llame a nuestra lnea principal al [phone] para una actualizacin sobre el estado de cualquier retraso o cierre.  Consejos para la medicacin en dermatologa: Por favor, guarde las cajas en las que vienen los medicamentos de uso tpico para ayudarle a seguir las instrucciones sobre dnde y cmo usarlos. Las farmacias generalmente imprimen las instrucciones del medicamento slo en las cajas y no directamente en los tubos del medicamento.   Si su medicamento es muy caro, por favor, pngase en contacto con nuestra oficina llamando al [phone] y presione la opcin 4 o envenos un mensaje a travs de MyChart.   No podemos decirle cul ser su copago por los medicamentos por adelantado ya que esto es diferente dependiendo de la cobertura de su seguro. Sin embargo, es posible que podamos encontrar un medicamento sustituto a menor costo o llenar un formulario para que el seguro cubra el medicamento que se considera necesario.   Si se requiere una autorizacin previa para que su compaa de seguros cubra su medicamento, por favor permtanos de 1 a 2  das hbiles para completar este proceso.  Los precios de los medicamentos varan con frecuencia dependiendo del lugar de dnde se surte la receta y alguna farmacias pueden ofrecer precios ms baratos.  El sitio web www.goodrx.com tiene cupones para medicamentos de diferentes farmacias. Los precios aqu no tienen en cuenta lo que podra costar con la ayuda del seguro (puede ser ms barato con su seguro), pero el sitio web puede darle el precio si no utiliz ningn seguro.  - Puede imprimir el cupn correspondiente y llevarlo con su receta a la farmacia.  - Tambin puede pasar por nuestra oficina durante el horario de atencin regular y recoger una tarjeta de cupones de GoodRx.  - Si necesita que su receta se enve electrnicamente a una farmacia diferente, informe a nuestra oficina a travs de MyChart de [place] o por telfono llamando al [phone] y presione la opcin 4.  

## 2023-01-10 NOTE — Progress Notes (Signed)
Follow-Up Visit   Subjective  Debbie Sanchez is a 67 y.o. female who presents for the following: Skin Cancer Screening and Full Body Skin Exam Hx of dysplastic , hx of rosacea  The patient presents for Total-Body Skin Exam (TBSE) for skin cancer screening and mole check. The patient has spots, moles and lesions to be evaluated, some may be new or changing and the patient has concerns that these could be cancer.  The following portions of the chart were reviewed this encounter and updated as appropriate: medications, allergies, medical history  Review of Systems:  No other skin or systemic complaints except as noted in HPI or Assessment and Plan.  Objective  Well appearing patient in no apparent distress; mood and affect are within normal limits.  A full examination was performed including scalp, head, eyes, ears, nose, lips, neck, chest, axillae, abdomen, back, buttocks, bilateral upper extremities, bilateral lower extremities, hands, feet, fingers, toes, fingernails, and toenails. All findings within normal limits unless otherwise noted below.   Relevant physical exam findings are noted in the Assessment and Plan.   Assessment & Plan   ROSACEA Exam Mid face erythema with telangiectasias   Rosacea is a chronic progressive skin condition usually affecting the face of adults, causing redness and/or acne bumps. It is treatable but not curable. It sometimes affects the eyes (ocular rosacea) as well. It may respond to topical and/or systemic medication and can flare with stress, sun exposure, alcohol, exercise, topical steroids (including hydrocortisone/cortisone 10) and some foods.  Daily application of broad spectrum spf 30+ sunscreen to face is recommended to reduce flares.  Treatment Plan Counseling for BBL / IPL / Laser and Coordination of Care Discussed the treatment option of Broad Band Light (BBL) /Intense Pulsed Light (IPL)/ Laser for skin discoloration, including brown spots and  redness.  Typically we recommend at least 1-3 treatment sessions about 5-8 weeks apart for best results.  Cannot have tanned skin when BBL performed, and regular use of sunscreen is advised after the procedure to help maintain results. The patient's condition may also require "maintenance treatments" in the future.  The fee for BBL / laser treatments is $350 per treatment session for the whole face.  A fee can be quoted for other parts of the body.  Insurance typically does not pay for BBL/laser treatments and therefore the fee is an out-of-pocket cost.  Patient defers treatment   LENTIGINES, SEBORRHEIC KERATOSES, HEMANGIOMAS - Benign normal skin lesions - Benign-appearing - Call for any changes  MELANOCYTIC NEVI - Tan-brown and/or pink-flesh-colored symmetric macules and papules - Benign appearing on exam today - Observation - Call clinic for new or changing moles - Recommend daily use of broad spectrum spf 30+ sunscreen to sun-exposed areas.   ACTINIC DAMAGE - Chronic condition, secondary to cumulative UV/sun exposure - diffuse scaly erythematous macules with underlying dyspigmentation - Recommend daily broad spectrum sunscreen SPF 30+ to sun-exposed areas, reapply every 2 hours as needed.  - Staying in the shade or wearing long sleeves, sun glasses (UVA+UVB protection) and wide brim hats (4-inch brim around the entire circumference of the hat) are also recommended for sun protection.  - Call for new or changing lesions.  HISTORY OF DYSPLASTIC NEVUS See history No evidence of recurrence today Recommend regular full body skin exams Recommend daily broad spectrum sunscreen SPF 30+ to sun-exposed areas, reapply every 2 hours as needed.  Call if any new or changing lesions are noted between office visits  Bx benign at  left plantar foot (photo taken) 07/05/2021  No evidence of recurrence today Recommend regular full body skin exams Recommend daily broad spectrum sunscreen SPF 30+ to  sun-exposed areas, reapply every 2 hours as needed.  Call if any new or changing lesions are noted between office visits  SKIN CANCER SCREENING PERFORMED TODAY.   Return in about 1 year (around 01/10/2024) for TBSE.  IAsher Muir, CMA, am acting as scribe for Armida Sans, MD.  Documentation: I have reviewed the above documentation for accuracy and completeness, and I agree with the above.  Armida Sans, MD

## 2023-01-16 ENCOUNTER — Encounter: Payer: Self-pay | Admitting: Internal Medicine

## 2023-01-16 ENCOUNTER — Ambulatory Visit: Payer: Medicare HMO | Admitting: Internal Medicine

## 2023-01-16 VITALS — BP 132/72 | HR 86 | Temp 98.4°F | Ht 59.0 in | Wt 104.4 lb

## 2023-01-16 DIAGNOSIS — E785 Hyperlipidemia, unspecified: Secondary | ICD-10-CM | POA: Diagnosis not present

## 2023-01-16 DIAGNOSIS — Z1211 Encounter for screening for malignant neoplasm of colon: Secondary | ICD-10-CM

## 2023-01-16 DIAGNOSIS — F411 Generalized anxiety disorder: Secondary | ICD-10-CM

## 2023-01-16 DIAGNOSIS — I471 Supraventricular tachycardia, unspecified: Secondary | ICD-10-CM

## 2023-01-16 DIAGNOSIS — R7303 Prediabetes: Secondary | ICD-10-CM | POA: Diagnosis not present

## 2023-01-16 DIAGNOSIS — R5383 Other fatigue: Secondary | ICD-10-CM

## 2023-01-16 DIAGNOSIS — F5101 Primary insomnia: Secondary | ICD-10-CM

## 2023-01-16 DIAGNOSIS — G250 Essential tremor: Secondary | ICD-10-CM

## 2023-01-16 MED ORDER — ALPRAZOLAM 0.25 MG PO TABS: 0.2500 mg | ORAL_TABLET | Freq: Every evening | ORAL | 5 refills | Status: DC | PRN

## 2023-01-16 MED ORDER — TRAZODONE HCL 50 MG PO TABS: 25.0000 mg | ORAL_TABLET | Freq: Every evening | ORAL | 3 refills | Status: DC | PRN

## 2023-01-16 MED ORDER — SERTRALINE HCL 50 MG PO TABS: 50.0000 mg | ORAL_TABLET | Freq: Every day | ORAL | 1 refills | Status: DC
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
## 2023-01-16 NOTE — Patient Instructions (Addendum)
Please start the  Sertraline (generic for Zoloft) at 1/2 tablet daily in the morning with breakfast for the first week to avoid nausea.  You can increase to a full tablet after 1 week if you havenot developed side effects of nausea.    You should start to feel a difference after two weeks on the full dose   add trazodone 1/2 tablet at bedtime.  Continue using the alprazolam as needed If you are still waking up in the middle of the night .  Referral to Behavioral [facility] IN process

## 2023-01-17 LAB — COMPREHENSIVE METABOLIC PANEL
ALT: 25 U/L (ref 0–35)
AST: 20 U/L (ref 0–37)
Albumin: 3.9 g/dL (ref 3.5–5.2)
Alkaline Phosphatase: 72 U/L (ref 39–117)
BUN: 15 mg/dL (ref 6–23)
CO2: 31 mEq/L (ref 19–32)
Calcium: 9.5 mg/dL (ref 8.4–10.5)
Chloride: 102 mEq/L (ref 96–112)
Creatinine, Ser: 0.61 mg/dL (ref 0.40–1.20)
GFR: 92.99 mL/min (ref >60.00)
Glucose, Bld: 96 mg/dL (ref 70–99)
Potassium: 4.1 mEq/L (ref 3.5–5.1)
Sodium: 141 mEq/L (ref 135–145)
Total Bilirubin: 0.4 mg/dL (ref 0.2–1.2)
Total Protein: 6.8 g/dL (ref 6.0–8.3)

## 2023-01-17 LAB — CBC WITH DIFFERENTIAL/PLATELET
Basophils Absolute: 0 10*3/uL (ref 0.0–0.1)
Basophils Relative: 0.6 % (ref 0.0–3.0)
Eosinophils Absolute: 0.1 10*3/uL (ref 0.0–0.7)
Eosinophils Relative: 1.3 % (ref 0.0–5.0)
HCT: 41.9 % (ref 36.0–46.0)
Hemoglobin: 13.7 g/dL (ref 12.0–15.0)
Lymphocytes Relative: 28.1 % (ref 12.0–46.0)
Lymphs Abs: 1.9 10*3/uL (ref 0.7–4.0)
MCHC: 32.6 g/dL (ref 30.0–36.0)
MCV: 92.9 fl (ref 78.0–100.0)
Monocytes Absolute: 0.6 10*3/uL (ref 0.1–1.0)
Monocytes Relative: 8.8 % (ref 3.0–12.0)
Neutro Abs: 4 10*3/uL (ref 1.4–7.7)
Neutrophils Relative %: 61.2 % (ref 43.0–77.0)
Platelets: 285 10*3/uL (ref 150.0–400.0)
RBC: 4.51 Mil/uL (ref 3.87–5.11)
RDW: 12.5 % (ref 11.5–15.5)
WBC: 6.6 10*3/uL (ref 4.0–10.5)

## 2023-01-17 LAB — LDL CHOLESTEROL, DIRECT: Direct LDL: 115 mg/dL

## 2023-01-17 LAB — TSH: TSH: 1.17 u[IU]/mL (ref 0.35–5.50)

## 2023-01-17 LAB — LIPID PANEL
Cholesterol: 204 mg/dL — ABNORMAL HIGH (ref 0–200)
HDL: 77.4 mg/dL (ref >39.00)
LDL Cholesterol: 115 mg/dL — ABNORMAL HIGH (ref 0–99)
NonHDL: 126.44
Total CHOL/HDL Ratio: 3
Triglycerides: 56 mg/dL (ref 0.0–149.0)
VLDL: 11.2 mg/dL (ref 0.0–40.0)

## 2023-01-17 LAB — B12 AND FOLATE PANEL
Folate: 21.6 ng/mL (ref >5.9)
Vitamin B-12: >1500 pg/mL — ABNORMAL HIGH (ref 211–911)

## 2023-01-17 LAB — HEMOGLOBIN A1C: Hgb A1c MFr Bld: 5.7 % (ref 4.6–6.5)

## 2023-01-18 ENCOUNTER — Encounter: Payer: Self-pay | Admitting: Internal Medicine

## 2023-01-18 DIAGNOSIS — F411 Generalized anxiety disorder: Secondary | ICD-10-CM | POA: Insufficient documentation

## 2023-01-18 DIAGNOSIS — R5383 Other fatigue: Secondary | ICD-10-CM | POA: Insufficient documentation
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
## 2023-01-18 NOTE — Assessment & Plan Note (Signed)
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  She defers medication 

## 2023-01-18 NOTE — Assessment & Plan Note (Signed)
Screening labs normal.  Likely due  to poor sleep .

## 2023-01-18 NOTE — Assessment & Plan Note (Signed)
Trial of trazodone. 

## 2023-01-18 NOTE — Assessment & Plan Note (Addendum)
No recent episodes . Has prn inderal

## 2023-01-23 ENCOUNTER — Encounter: Payer: Self-pay | Admitting: Dermatology

## 2023-02-19 ENCOUNTER — Ambulatory Visit (INDEPENDENT_AMBULATORY_CARE_PROVIDER_SITE_OTHER): Payer: Medicare HMO | Admitting: Internal Medicine

## 2023-02-19 ENCOUNTER — Encounter: Payer: Self-pay | Admitting: Internal Medicine

## 2023-02-19 VITALS — BP 122/82 | HR 85 | Temp 98.1°F | Ht 59.0 in | Wt 102.8 lb

## 2023-02-19 DIAGNOSIS — R5383 Other fatigue: Secondary | ICD-10-CM | POA: Diagnosis not present

## 2023-02-19 DIAGNOSIS — F411 Generalized anxiety disorder: Secondary | ICD-10-CM | POA: Diagnosis not present

## 2023-02-19 DIAGNOSIS — F32A Depression, unspecified: Secondary | ICD-10-CM

## 2023-02-19 MED ORDER — BUPROPION HCL ER (SR) 150 MG PO TB12: 150.0000 mg | ORAL_TABLET | Freq: Two times a day (BID) | ORAL | 2 refills | Status: DC
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
## 2023-02-19 NOTE — Patient Instructions (Addendum)
II'm glad you are feeling better,  You have refills on the alprazolam  When you start  the wellbutrin,  start with 1 tablet daily  : take it in the morning with breakfast  Reduce sertraline after one week to 1/2 tablet daily   for one week,  then stop sertraline completely  If the anxiety worsens,  you have the choice of resuming sertraline or adding something completely different (buspar)   Regarding the cough:  it it  persists at night,  please consider resuming omeprazole for treatment of GERD .  You can take your dose at dinnertime,  or try using famotidine 20 mg at dinner  (OTC pepcid)

## 2023-02-21 NOTE — Assessment & Plan Note (Signed)
Starting wellbutrin for anhedonia lack of motivation .  Weaning sertraline  to off
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
## 2023-04-11 ENCOUNTER — Other Ambulatory Visit: Payer: Self-pay | Admitting: Internal Medicine

## 2023-04-11 ENCOUNTER — Encounter: Payer: Self-pay | Admitting: Internal Medicine

## 2023-04-11 MED ORDER — SERTRALINE HCL 50 MG PO TABS: 50.0000 mg | ORAL_TABLET | Freq: Every day | ORAL | 1 refills | Status: DC

## 2023-05-07 NOTE — Telephone Encounter (Signed)
noted 

## 2023-05-08 ENCOUNTER — Other Ambulatory Visit: Payer: Self-pay | Admitting: Internal Medicine

## 2023-05-12 IMAGING — CT CT ABD-PEL WO/W CM
3 of 12 series · 11 of 46 positions shown, 17 images · IV contrast (agent unspecified)
Comparison: 10/25/2017

CLINICAL DATA: Gross hematuria, multiple UTIs

EXAM:
CT ABDOMEN AND PELVIS WITHOUT AND WITH CONTRAST
TECHNIQUE: Multidetector CT imaging of the abdomen and pelvis was performed
following the standard protocol before and following the bolus
administration of intravenous contrast.

[Series 2: abd without pre 5.00 · axial · non-contrast · 0.58mm/px · z∈[-1469,-1169]mm · 7 of 82 slices shown, 12 images]
[im 11/82  soft-tissue]
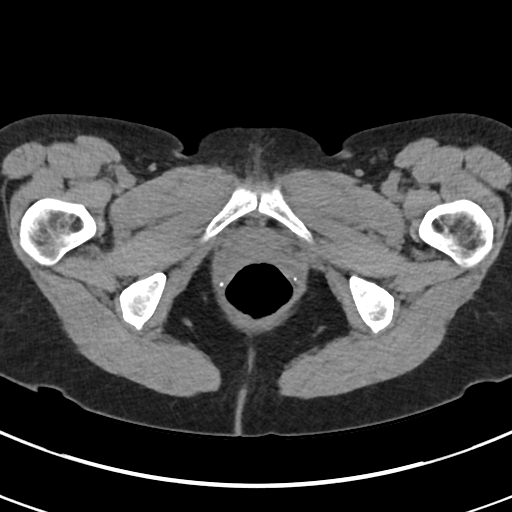
[im 11/82  bone]
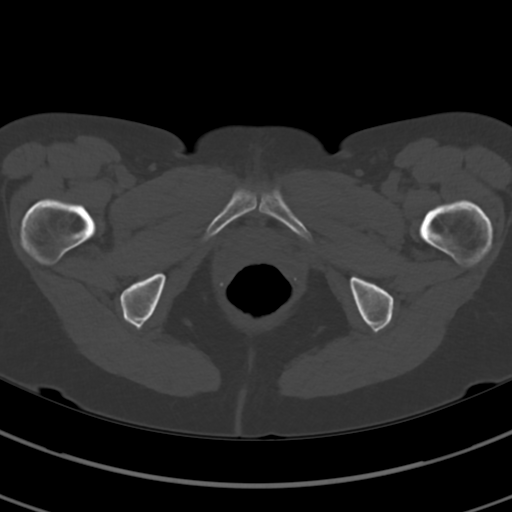
[im 21/82  soft-tissue]
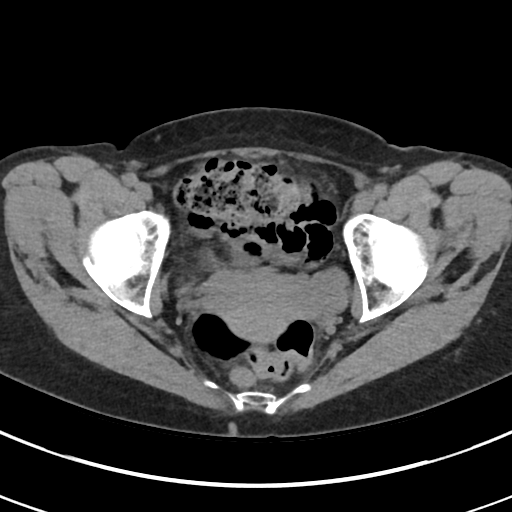
[im 31/82  soft-tissue]
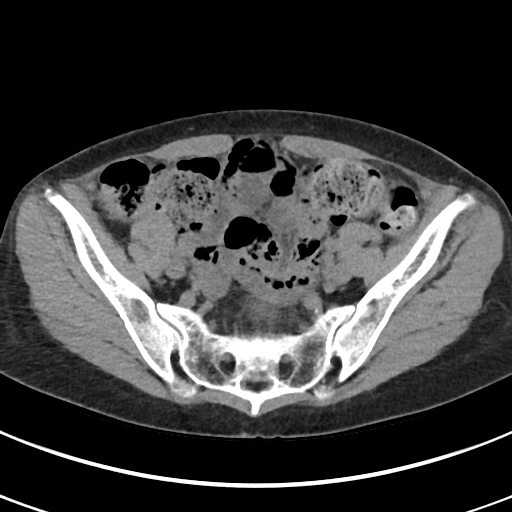
[im 41/82  soft-tissue]
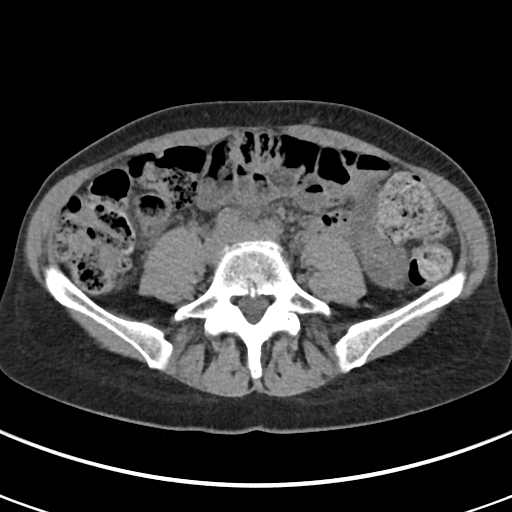
[im 41/82  lung]
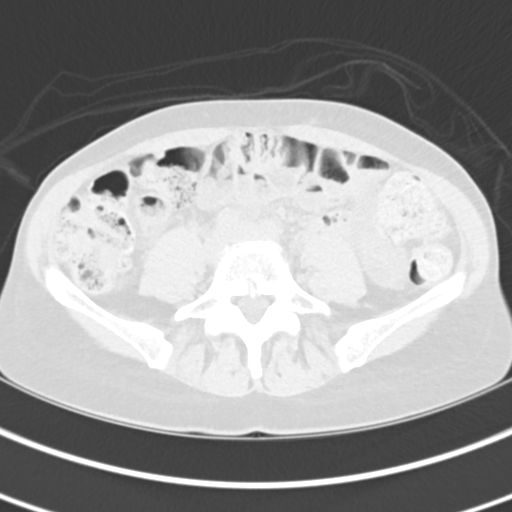
[im 51/82  soft-tissue]
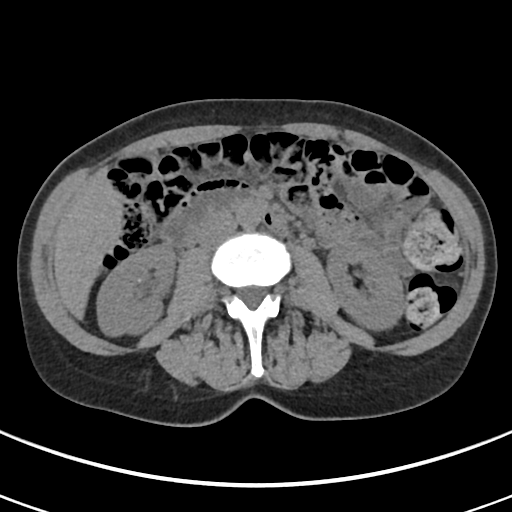
[im 51/82  lung]
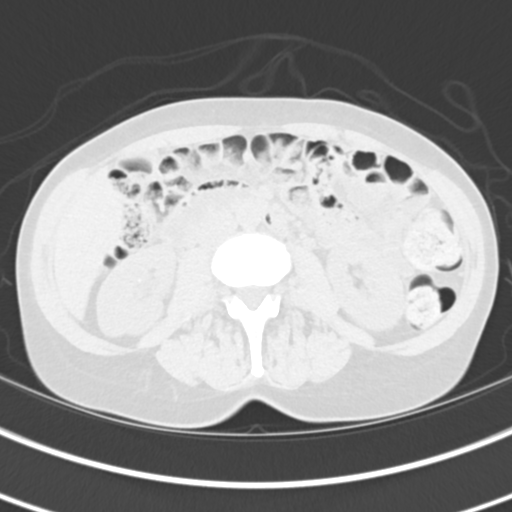
[im 61/82  soft-tissue]
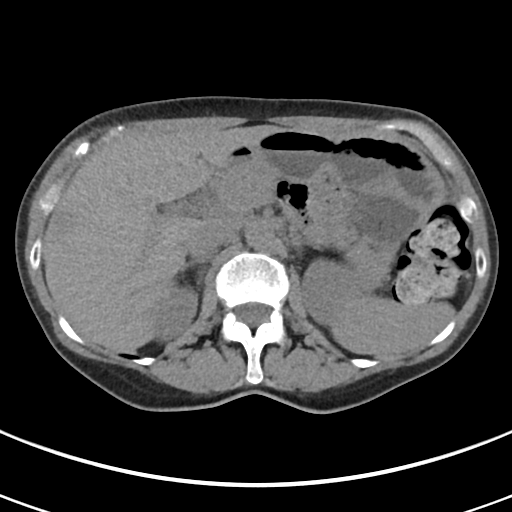
[im 61/82  lung]
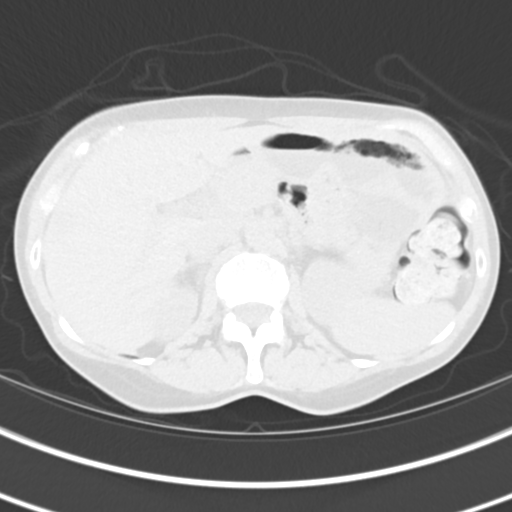
[im 71/82  soft-tissue]
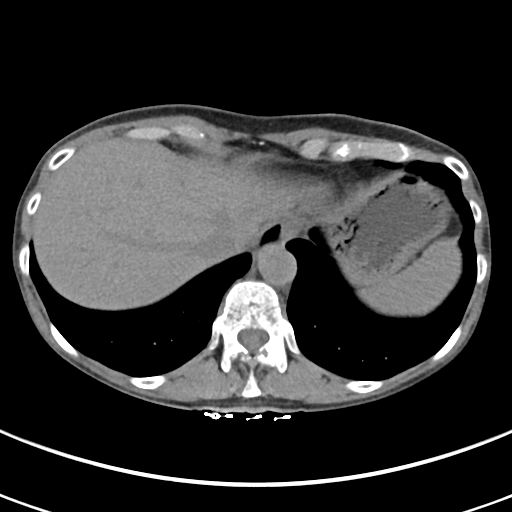
[im 71/82  lung]
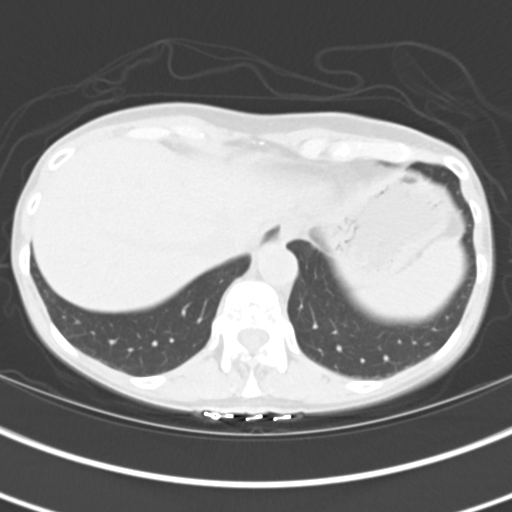

[Series 9: axial with hematuria with 5.00 · axial · 0.58mm/px · z∈[-1464,-1404]mm · 2 of 82 slices shown]
[im 12/82  soft-tissue]
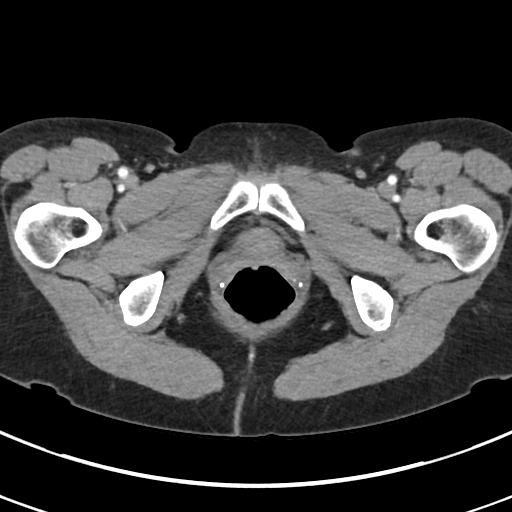
[im 24/82  soft-tissue]
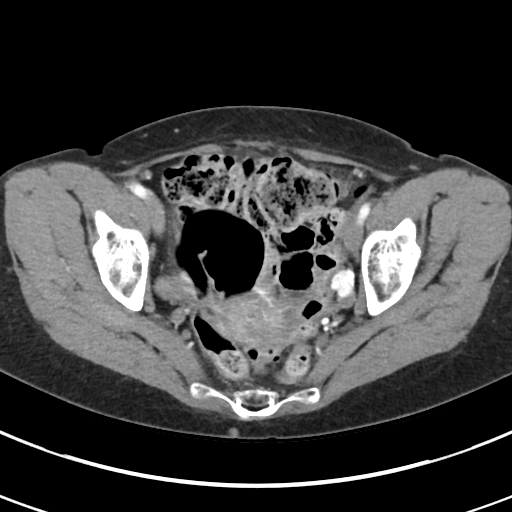

[Series 20: cor delay delay prone 2.00 cor · coronal · delayed · 0.58mm/px · 2 of 117 slices shown, 3 images]
[im 39/117  soft-tissue]
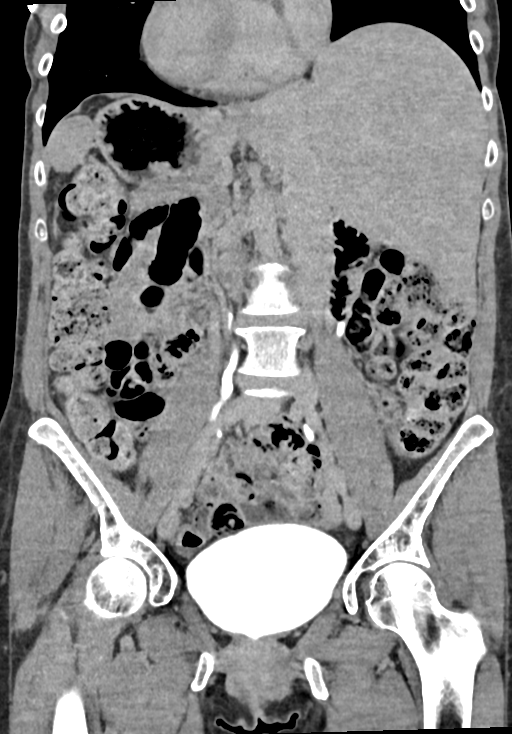
[im 39/117  bone]
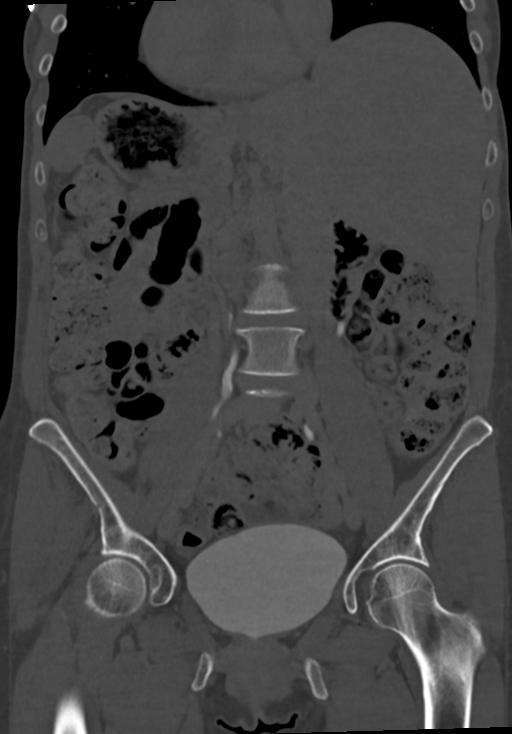
[im 78/117  soft-tissue]
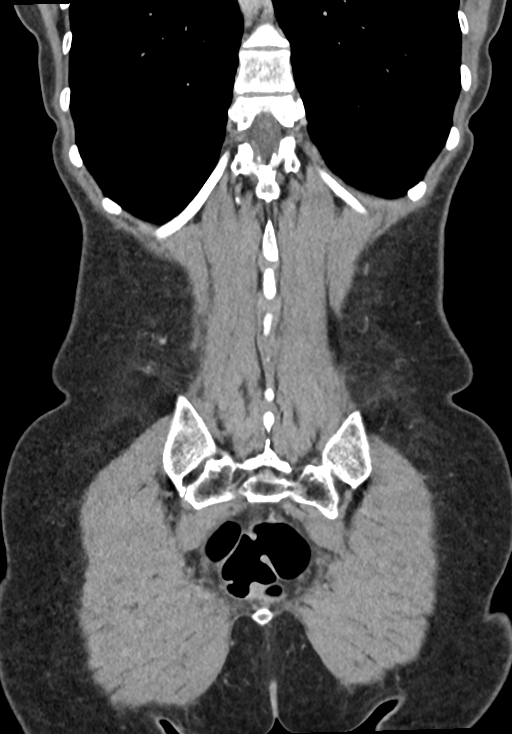

[11 of 46 positions shown; findings below may reference images not displayed]

RADIATION DOSE REDUCTION: This exam was performed according to the
departmental dose-optimization program which includes automated
exposure control, adjustment of the mA and/or kV according to
patient size and/or use of iterative reconstruction technique.

CONTRAST:  75mL OMNIPAQUE IOHEXOL 350 MG/ML SOLN
FINDINGS: Lower chest: No acute abnormality.

Hepatobiliary: Stable, definitively benign small hemangiomata,
largest of the lateral right lobe of the liver with peripheral
nodular enhancement, hepatic segment VI, measuring 2.9 x 2.5 cm
(series 9, image 23). No further follow-up or characterization is
required. No solid liver abnormality is seen. No gallstones,
gallbladder wall thickening, or biliary dilatation.

Pancreas: Unremarkable. No pancreatic ductal dilatation or
surrounding inflammatory changes.

Spleen: Normal in size without significant abnormality.

Adrenals/Urinary Tract: Adrenal glands are unremarkable. Small
nonobstructive calculus of the inferior pole of the right kidney. No
left-sided calculi, ureteral calculi, or hydronephrosis. No urinary
tract filling defect on delayed phase imaging. Bladder is
unremarkable.

Stomach/Bowel: Stomach is within normal limits. Appendix is not
clearly visualized and may be surgically absent. No evidence of
bowel wall thickening, distention, or inflammatory changes. Large
burden of stool throughout the colon and rectum.

Vascular/Lymphatic: No significant vascular findings are present. No
enlarged abdominal or pelvic lymph nodes.

Reproductive: No mass or other significant abnormality. Prominent
bilateral uterine and adnexal varices (series 9, image 61).

Other: No abdominal wall hernia or abnormality. No ascites.

Musculoskeletal: No acute or significant osseous findings.
IMPRESSION: 1. Small nonobstructive calculus of the inferior pole of the right
kidney. No left-sided calculi, ureteral calculi, or hydronephrosis.
2. No urinary tract mass, suspicious contrast enhancement, or
filling defect on delayed phase imaging.
3. Prominent bilateral uterine and adnexal varices, which can be
seen in pelvic congestion syndrome. Correlate for referable clinical
symptoms, if present.

## 2023-05-21 ENCOUNTER — Encounter: Payer: Self-pay | Admitting: Internal Medicine

## 2023-05-21 ENCOUNTER — Telehealth: Payer: Medicare HMO | Admitting: Internal Medicine

## 2023-05-21 VITALS — Ht 59.0 in | Wt 102.0 lb

## 2023-05-21 DIAGNOSIS — G4452 New daily persistent headache (NDPH): Secondary | ICD-10-CM | POA: Insufficient documentation

## 2023-05-21 DIAGNOSIS — F411 Generalized anxiety disorder: Secondary | ICD-10-CM | POA: Diagnosis not present
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
## 2023-05-21 NOTE — Assessment & Plan Note (Signed)
She has had a focal headache involving the right occipital lobe daily for over 2 months despite discontinuing wellbutrin.  Given her persistet headache and tremor,  will rule out occipital space occupying lesion with MRI

## 2023-05-22 ENCOUNTER — Telehealth: Payer: Self-pay | Admitting: Internal Medicine

## 2023-05-22 NOTE — Telephone Encounter (Signed)
Lft pt vm to call ofc to sch MRI. thanks 

## 2023-05-23 ENCOUNTER — Telehealth: Payer: Self-pay | Admitting: Internal Medicine

## 2023-05-23 NOTE — Telephone Encounter (Signed)
Lft pt vm to call ofc . Thanks

## 2023-05-28 ENCOUNTER — Ambulatory Visit
Admission: RE | Admit: 2023-05-28 | Discharge: 2023-05-28 | Disposition: A | Discharge status: Home / Self Care | Payer: Medicare HMO | Source: Ambulatory Visit | Attending: Internal Medicine | Admitting: Internal Medicine

## 2023-05-28 DIAGNOSIS — G4452 New daily persistent headache (NDPH): Secondary | ICD-10-CM | POA: Diagnosis present

## 2023-06-06 ENCOUNTER — Ambulatory Visit: Payer: Medicare HMO

## 2023-06-06 DIAGNOSIS — K640 First degree hemorrhoids: Secondary | ICD-10-CM

## 2023-06-06 DIAGNOSIS — Z800 Family history of malignant neoplasm of digestive organs: Secondary | ICD-10-CM

## 2023-06-06 DIAGNOSIS — Z1211 Encounter for screening for malignant neoplasm of colon: Secondary | ICD-10-CM

## 2023-06-24 ENCOUNTER — Ambulatory Visit: Payer: Medicare HMO | Admitting: Internal Medicine

## 2023-06-24 ENCOUNTER — Ambulatory Visit (INDEPENDENT_AMBULATORY_CARE_PROVIDER_SITE_OTHER): Payer: Medicare HMO | Admitting: *Deleted

## 2023-06-24 ENCOUNTER — Encounter: Payer: Self-pay | Admitting: Internal Medicine

## 2023-06-24 VITALS — BP 114/70 | HR 88 | Ht 59.0 in | Wt 100.8 lb

## 2023-06-24 VITALS — BP 114/70 | HR 88 | Temp 97.0°F | Ht 59.0 in | Wt 100.5 lb

## 2023-06-24 DIAGNOSIS — E785 Hyperlipidemia, unspecified: Secondary | ICD-10-CM | POA: Diagnosis not present

## 2023-06-24 DIAGNOSIS — Z1231 Encounter for screening mammogram for malignant neoplasm of breast: Secondary | ICD-10-CM

## 2023-06-24 DIAGNOSIS — Z23 Encounter for immunization: Secondary | ICD-10-CM

## 2023-06-24 DIAGNOSIS — Z0000 Encounter for general adult medical examination without abnormal findings: Secondary | ICD-10-CM | POA: Diagnosis not present

## 2023-06-24 DIAGNOSIS — G4452 New daily persistent headache (NDPH): Secondary | ICD-10-CM

## 2023-06-24 DIAGNOSIS — F411 Generalized anxiety disorder: Secondary | ICD-10-CM | POA: Diagnosis not present

## 2023-06-24 DIAGNOSIS — Z780 Asymptomatic menopausal state: Secondary | ICD-10-CM

## 2023-06-24 DIAGNOSIS — G250 Essential tremor: Secondary | ICD-10-CM | POA: Diagnosis not present

## 2023-06-24 DIAGNOSIS — F5101 Primary insomnia: Secondary | ICD-10-CM | POA: Diagnosis not present

## 2023-06-24 MED ORDER — ALPRAZOLAM 0.25 MG PO TABS: 0.2500 mg | ORAL_TABLET | Freq: Every evening | ORAL | 5 refills | Status: DC | PRN

## 2023-06-24 NOTE — Progress Notes (Signed)
Subjective:   ARI ENGELBRECHT is a 67 y.o. female who presents for Medicare Annual (Subsequent) preventive examination.  Visit Complete: In person   Cardiac Risk Factors include: advanced age (>34men, >83 women)     Objective:    Today's Vitals   06/24/23 1246  BP: 114/70  Pulse: 88  Temp: (!) 97 F (36.1 C)  TempSrc: Skin  SpO2: 96%  Weight: 100 lb 8 oz (45.6 kg)  Height: 4\' 11"  (1.499 m)   Body mass index is 20.3 kg/m.     06/24/2023    1:02 PM 06/18/2022    9:39 AM 07/01/2019    4:33 PM  Advanced Directives  Does Patient Have a Medical Advance Directive? No No No  Would patient like information on creating a medical advance directive? Yes (MAU/Ambulatory/Procedural Areas - Information given) No - Patient declined     Current Medications (verified) Outpatient Encounter Medications as of 06/24/2023  Medication Sig   ALPRAZolam (XANAX) 0.25 MG tablet Take 1 tablet (0.25 mg total) by mouth at bedtime as needed for anxiety.   Ascorbic Acid (VITAMIN C) 100 MG tablet Take 1,000 mg by mouth 2 (two) times daily.    Calcium Carbonate-Vit D-Min (CALCIUM 1200 PO) Take 1 tablet by mouth.   Cholecalciferol (VITAMIN D3) 125 MCG (5000 UT) TABS Take 5,000 Units by mouth.   CINNAMON PO Take 1 tablet by mouth daily.   CRANBERRY CONCENTRATE PO Take 1 tablet by mouth.   Cyanocobalamin (VITAMIN B 12) 500 MCG TABS Take by mouth.   Multiple Vitamin (MULTIVITAMIN) tablet Take 1 tablet by mouth daily.   propranolol (INDERAL) 10 MG tablet Take 1 tablet (10 mg total) by mouth 3 (three) times daily. As needed for rapid heart rate   sertraline (ZOLOFT) 50 MG tablet Take 1 tablet (50 mg total) by mouth daily.   traZODone (DESYREL) 50 MG tablet TAKE 1/2 TO 1 TABLET BY MOUTH AT [facility] NEEDED FOR SLEEP   No facility-administered encounter medications on file as of 06/24/2023.    Allergies (verified) Patient has no known allergies.   History: Past Medical History:  Diagnosis Date    Fibrocystic breast disease    rt breast aspiration 2002   Hx of dysplastic nevus 03/11/2018   Upper back right paraspinal   Hx of dysplastic nevus 12/19/2016   R inframammary, mild to moderate   Menopause    last menses 2008   Past Surgical History:  Procedure Laterality Date   BREAST BIOPSY Right 2010   cyst aspiration/US guided biopsy, clip placement   BREAST CYST ASPIRATION Right 2002   cyst aspiration   CESAREAN SECTION  1998   MOUTH SURGERY     TUBAL LIGATION     Family History  Problem Relation Age of Onset   Stroke Mother    Cancer Mother 18       colon CA / resected completely 1999   Cancer Maternal Aunt        breast CA   Breast cancer Other    Social History   Socioeconomic History   Marital status: Widowed    Spouse name: Not on file   Number of children: Not on file   Years of education: Not on file   Highest education level: Not on file  Occupational History   Not on file  Tobacco Use   Smoking status: Never   Smokeless tobacco: Never  Substance and Sexual Activity   Alcohol use: Yes    Comment: rare  Drug use: No   Sexual activity: Not on file  Other Topics Concern   Not on file  Social History Narrative   Not on file   Social Determinants of Health   Financial Resource Strain: Low Risk  (06/24/2023)   Overall Financial Resource Strain (CARDIA)    Difficulty of Paying Living Expenses: Not hard at all  Food Insecurity: No Food Insecurity (06/24/2023)   Hunger Vital Sign    Worried About Running Out of Food in the Last Year: Never true    Ran Out of Food in the Last Year: Never true  Transportation Needs: No Transportation Needs (06/24/2023)   PRAPARE - Administrator, Civil Service (Medical): No    Lack of Transportation (Non-Medical): No  Physical Activity: Sufficiently Active (06/24/2023)   Exercise Vital Sign    Days of Exercise per Week: 5 days    Minutes of Exercise per Session: 50 min  Stress: Stress Concern Present  (06/24/2023)   [employer] of Occupational Health - Occupational Stress Questionnaire    Feeling of Stress : To some extent  Social Connections: Moderately Integrated (06/24/2023)   Social Connection and Isolation Panel [NHANES]    Frequency of Communication with Friends and Family: More than three times a week    Frequency of Social Gatherings with Friends and Family: Once a week    Attends Religious Services: Never    Database administrator or Organizations: Yes    Attends Engineer, structural: More than 4 times per year    Marital Status: Living with partner    Tobacco Counseling Counseling given: Not Answered   Clinical Intake:  Pre-visit preparation completed: Yes  Pain : No/denies pain     BMI - recorded: 20.3 Nutritional Status: BMI of 19-24  Normal Nutritional Risks: None Diabetes: No  How often do you need to have someone help you when you read instructions, pamphlets, or other written materials from your doctor or pharmacy?: 1 - Never  Interpreter Needed?: No  Information entered by :: R. Sammie Schermerhorn LPN   Activities of Daily Living    06/24/2023   12:49 PM  In your present state of health, do you have any difficulty performing the following activities:  Hearing? 0  Vision? 0  Comment contact and readers  Difficulty concentrating or making decisions? 0  Walking or climbing stairs? 0  Dressing or bathing? 0  Doing errands, shopping? 0  Preparing Food and eating ? N  Using the Toilet? N  In the past six months, have you accidently leaked urine? N  Do you have problems with loss of bowel control? N  Managing your Medications? N  Managing your Finances? N  Housekeeping or managing your Housekeeping? N    Patient Care Team: Sherlene Shams, MD as PCP - General (Internal Medicine) Sherlene Shams, MD (Internal Medicine)  Indicate any recent Medical Services you may have received from other than [facility] providers in the past year (date may be  approximate).     Assessment:   This is a routine wellness examination for Maizy.  Hearing/Vision screen Hearing Screening - Comments:: No issues Vision Screening - Comments:: Contacts and readers   Goals Addressed             This Visit's Progress    Patient Stated       Wants to continue to exercise and better nutrition       Depression Screen    06/24/2023  12:55 PM 05/21/2023    4:50 PM 02/19/2023   11:27 AM 01/16/2023    1:44 PM 06/18/2022    9:36 AM 06/16/2021   10:06 AM 06/16/2020   10:55 AM  PHQ 2/9 Scores  PHQ - 2 Score 0 1 0 3 0 0 0  PHQ- 9 Score 1  2 11   2   Exception Documentation Patient refusal          Fall Risk    06/24/2023   12:52 PM 05/21/2023    4:50 PM 02/19/2023   11:27 AM 01/16/2023    1:44 PM 07/16/2022    1:04 PM  Fall Risk   Falls in the past year? 0 0 0 0 0  Number falls in past yr: 0 0 0 0   Injury with Fall? 0 0 0 0   Risk for fall due to : No Fall Risks No Fall Risks No Fall Risks No Fall Risks No Fall Risks  Follow up Falls prevention discussed;Falls evaluation completed Falls evaluation completed Falls evaluation completed Falls evaluation completed Falls evaluation completed    MEDICARE RISK AT HOME: Medicare Risk at Home Any stairs in or around the home?: Yes If so, are there any without handrails?: No Home free of loose throw rugs in walkways, pet beds, electrical cords, etc?: Yes Adequate lighting in your home to reduce risk of falls?: Yes Life alert?: No Use of a cane, walker or w/c?: No Grab bars in the bathroom?: No Shower chair or bench in shower?: No Elevated toilet seat or a handicapped toilet?: No  TIMED UP AND GO:  Was the test performed?  Yes  Length of time to ambulate 10 feet: 8 sec Gait steady and fast without use of assistive device    Cognitive Function:        06/24/2023    1:03 PM 06/18/2022    9:45 AM 06/16/2021   11:01 AM  6CIT Screen  What Year? 0 points 0 points 0 points  What month? 0  points 0 points 0 points  What time? 0 points 0 points 0 points  Count back from 20 0 points 0 points 0 points  Months in reverse 0 points 0 points 0 points  Repeat phrase 0 points 0 points 0 points  Total Score 0 points 0 points 0 points    Immunizations Immunization History  Administered Date(s) Administered   Fluad Quad(high Dose 65+) 06/16/2021, 06/18/2022   Influenza,inj,Quad PF,6+ Mos 06/15/2019, 06/16/2020   Influenza-Unspecified 05/20/2012, 06/13/2016, 05/29/2017, 06/05/2018   PFIZER(Purple Top)SARS-COV-2 Vaccination 11/24/2019, 12/15/2019   PNEUMOCOCCAL CONJUGATE-20 06/16/2021   Pfizer Covid-19 Vaccine Bivalent Booster 85yrs & up 05/15/2021   Tdap 03/03/2010   Zoster Recombinant(Shingrix) 09/19/2020, 01/12/2021    TDAP status: Due, Education has been provided regarding the importance of this vaccine. Advised may receive this vaccine at local pharmacy or Health Dept. Aware to provide a copy of the vaccination record if obtained from local pharmacy or Health Dept. Verbalized acceptance and understanding.  Flu Vaccine status: Completed at today's visit  Pneumococcal vaccine status: Up to date  Covid-19 vaccine status: Information provided on how to obtain vaccines.   Qualifies for Shingles Vaccine? Yes   Zostavax completed No   Shingrix Completed?: Yes  Screening Tests Health Maintenance  Topic Date Due   DTaP/Tdap/Td (2 - Td or Tdap) 03/03/2020   INFLUENZA VACCINE  03/21/2023   COVID-19 Vaccine (4 - 2023-24 season) 04/21/2023   Medicare Annual Wellness (AWV)  06/19/2023   MAMMOGRAM  08/11/2023   Colonoscopy  06/18/2028   Pneumonia Vaccine 18+ Years old  Completed   DEXA SCAN  Completed   Hepatitis C Screening  Completed   Zoster Vaccines- Shingrix  Completed   HPV VACCINES  Aged Out    Health Maintenance  Health Maintenance Due  Topic Date Due   DTaP/Tdap/Td (2 - Td or Tdap) 03/03/2020   INFLUENZA VACCINE  03/21/2023   COVID-19 Vaccine (4 - 2023-24  season) 04/21/2023   Medicare Annual Wellness (AWV)  06/19/2023    Colorectal cancer screening: Type of screening: Colonoscopy. Completed 05/2023. Repeat every 10 years  Mammogram status: Completed 07/2022. Repeat every year Order placed today  Bone Density status: Completed 07/2021. Results reflect: Bone density results: OSTEOPOROSIS. Repeat every 2 years. Order placed today  Lung Cancer Screening: (Low Dose CT Chest recommended if Age 75-80 years, 20 pack-year currently smoking OR have quit w/in 15years.) does not qualify.    Additional Screening:  Hepatitis C Screening: does qualify; Completed 05/2015  Vision Screening: Recommended annual ophthalmology exams for early detection of glaucoma and other disorders of the eye. Is the patient up to date with their annual eye exam?  Yes  Who is the provider or what is the name of the office in which the patient attends annual eye exams? [facility] If pt is not established with a provider, would they like to be referred to a provider to establish care? No .   Dental Screening: Recommended annual dental exams for proper oral hygiene    Community Resource Referral / Chronic Care Management: CRR required this visit?  No   CCM required this visit?  No     Plan:     I have personally reviewed and noted the following in the patient's chart:   Medical and social history Use of alcohol, tobacco or illicit drugs  Current medications and supplements including opioid prescriptions. Patient is not currently taking opioid prescriptions. Functional ability and status Nutritional status Physical activity Advanced directives List of other physicians Hospitalizations, surgeries, and ER visits in previous 12 months Vitals Screenings to include cognitive, depression, and falls Referrals and appointments  In addition, I have reviewed and discussed with patient certain preventive protocols, quality metrics, and best practice  recommendations. A written personalized care plan for preventive services as well as general preventive health recommendations were provided to patient.     Sydell Axon, LPN   29/12/6211   After Visit Summary: (In Person-Declined) Patient declined AVS at this time.  Nurse Notes: None

## 2023-06-24 NOTE — Patient Instructions (Addendum)
.  Debbie Sanchez , Thank you for taking time to come for your Medicare Wellness Visit. I appreciate your ongoing commitment to your health goals. Please review the following plan we discussed and let me know if I can assist you in the future.   Referrals/Orders/Follow-Ups/Clinician Recommendations: Mammogram and Bone Density Remember to update your vaccines  You have an order for:  []   2D Mammogram  [x]   3D Mammogram  [x]   Bone Density     Please call for appointment:  [facility]  [address] [place] [place] [address] [phone]    Make sure to wear two-piece clothing.  No lotions, powders, or deodorants the day of the appointment. Make sure to bring picture ID and insurance card.  Bring list of medications you are currently taking including any supplements.   Schedule your [place] screening mammogram through MyChart!   Log into your MyChart account.  Go to 'Visit' (or 'Appointments' if on mobile App) --> Schedule an Appointment  Under 'Select a Reason for Visit' choose the Mammogram Screening option.  Complete the pre-visit questions and select the time and place that best fits your schedule.    This is a list of the screening recommended for you and due dates:  Health Maintenance  Topic Date Due   DTaP/Tdap/Td vaccine (2 - Td or Tdap) 03/03/2020   COVID-19 Vaccine (4 - 2023-24 season) 04/21/2023   Mammogram  08/11/2023   Medicare Annual Wellness Visit  06/23/2024   Colon Cancer Screening  06/18/2028   Pneumonia Vaccine  Completed   Flu Shot  Completed   DEXA scan (bone density measurement)  Completed   Hepatitis C Screening  Completed   Zoster (Shingles) Vaccine  Completed   HPV Vaccine  Aged Out    Advanced directives: (Provided) Advance directive discussed with you today. I have provided a copy for you to complete at home and have notarized. Once this is complete, please bring a copy in to our office so we can  scan it into your chart.   Next Medicare Annual Wellness Visit scheduled for next year: Yes 07/01/24 @ 12:45

## 2023-06-24 NOTE — Patient Instructions (Addendum)
  I agree that your headache is likely from stress AND may be aggravated by cervical spine arthritis  Consider using a cervical support pillow   Your annual mammogram and your DEXA scan have been ordered;  please  call [place] to schedule your appointment [phone]

## 2023-06-24 NOTE — Assessment & Plan Note (Signed)
Occipital,  with recent MRI brain does to rule out masses,  CVA's  .  

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
## 2023-06-24 NOTE — Assessment & Plan Note (Signed)
Managed with 1/2 trazodone and 1/2 alprazolam .  No changes .  Alprazolam refilled.  The risks and benefits of benzodiazepine use were reviewed with patient today including excessive sedation leading to respiratory depression,  impaired thinking/driving, and addiction.  Patient was advised to avoid concurrent use with alcohol, to use medication only as needed and not to share with others  .

## 2023-06-25 NOTE — Assessment & Plan Note (Signed)
age appropriate education and counseling updated, referrals for preventative services and immunizations addressed, dietary and smoking counseling addressed, most recent labs reviewed.  I have personally reviewed and have noted:   1) the patient's medical and social history 2) The pt's use of alcohol, tobacco, and illicit drugs 3) The patient's current medications and supplements 4) Functional ability including ADL's, fall risk, home safety risk, hearing and visual impairment 5) Diet and physical activities 6) Evidence for depression or mood disorder 7) The patient's height, weight, and BMI have been recorded in the chartpatient is in sinus rhythm.     I have made referrals, and provided counseling and education based on review of the above

## 2023-06-25 NOTE — Assessment & Plan Note (Signed)
Intracranial  mass was ruled out recently with brain MRI
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
## 2023-07-19 ENCOUNTER — Other Ambulatory Visit (HOSPITAL_COMMUNITY): Payer: Self-pay

## 2023-07-23 ENCOUNTER — Telehealth: Payer: Self-pay

## 2023-07-23 NOTE — Telephone Encounter (Signed)
PA for Sertraline is needed

## 2023-07-26 ENCOUNTER — Other Ambulatory Visit (HOSPITAL_COMMUNITY): Payer: Self-pay

## 2023-07-26 ENCOUNTER — Telehealth: Payer: Self-pay

## 2023-07-26 NOTE — Telephone Encounter (Signed)
Per test claim, medication was filled 07/15/2023 at [facility], next available fill date 09/16/2023. No PA submitted at this time. Thanks

## 2023-07-26 NOTE — Telephone Encounter (Signed)
Noted  

## 2023-07-26 NOTE — Telephone Encounter (Signed)
Pharmacy Patient Advocate Encounter   Received notification from Pt Calls Messages that prior authorization for Sertraline 50mg  is required/requested.   Insurance verification completed.   The patient is insured through [employer] .   Per test claim: Refill too soon. PA is not needed at this time. Medication was filled 11/2. Next eligible fill date is 09/16/2023.

## 2023-10-02 ENCOUNTER — Encounter: Payer: Self-pay | Admitting: Internal Medicine

## 2023-10-02 ENCOUNTER — Ambulatory Visit
Admission: RE | Admit: 2023-10-02 | Discharge: 2023-10-02 | Disposition: A | Discharge status: Home / Self Care | Payer: Medicare HMO | Source: Ambulatory Visit | Attending: Internal Medicine | Admitting: Internal Medicine

## 2023-10-02 DIAGNOSIS — Z780 Asymptomatic menopausal state: Secondary | ICD-10-CM

## 2023-10-02 DIAGNOSIS — Z1231 Encounter for screening mammogram for malignant neoplasm of breast: Secondary | ICD-10-CM | POA: Diagnosis present

## 2023-10-19 ENCOUNTER — Other Ambulatory Visit: Payer: Self-pay | Admitting: Internal Medicine

## 2023-10-21 ENCOUNTER — Encounter: Payer: Self-pay | Admitting: Internal Medicine

## 2023-10-21 ENCOUNTER — Telehealth: Payer: Medicare HMO | Admitting: Internal Medicine

## 2023-10-21 ENCOUNTER — Other Ambulatory Visit: Payer: Self-pay | Admitting: Internal Medicine

## 2023-10-21 VITALS — Ht 59.0 in | Wt 100.8 lb

## 2023-10-21 DIAGNOSIS — M818 Other osteoporosis without current pathological fracture: Secondary | ICD-10-CM | POA: Diagnosis not present

## 2023-10-21 NOTE — Progress Notes (Unsigned)
 Virtual Visit via Caregility   Note   This format is felt to be most appropriate for this patient at this time.  All issues noted in this document were discussed and addressed.  No physical exam was performed (except for noted visual exam findings with Video Visits).   I connected with Debbie Sanchez  on 10/22/23 at  5:00 PM EST by a video enabled telemedicine application  and verified that I am speaking with the correct person using two identifiers. Location patient: home Location provider: work or home office Persons participating in the virtual visit: patient, provider  I discussed the limitations, risks, security and privacy concerns of performing an evaluation and management service by telephone and the availability of in person appointments. I also discussed with the patient that there may be a patient responsible charge related to this service. The patient expressed understanding and agreed to proceed.   Reason for visit: discuss osteoporosis treatment options   HPI:  Debbie Sanchez is  68 YR OLD female with a history of osteoporosis by prior DEXA ,  who presents today to review options of pharmacotherapy.  She has no history of fractures,  prolonged steroid use, tobacco abuse , dysphagia, CAD,,  DVT/PE  or breast cancer.    ROS: See pertinent positives and negatives per HPI.  Past Medical History:  Diagnosis Date   Fibrocystic breast disease    rt breast aspiration 2002   Hx of dysplastic nevus 03/11/2018   Upper back right paraspinal   Hx of dysplastic nevus 12/19/2016   R inframammary, mild to moderate   Menopause    last menses 2008    Past Surgical History:  Procedure Laterality Date   BREAST BIOPSY Right 2010   cyst aspiration/US guided biopsy, clip placement   BREAST CYST ASPIRATION Right 2002   cyst aspiration   CESAREAN SECTION  1998   MOUTH SURGERY     TUBAL LIGATION      Family History  Problem Relation Age of Onset   Stroke Mother    Cancer Mother 70       colon CA  / resected completely 1999   Cancer Maternal Aunt        breast CA   Breast cancer Other     SOCIAL HX:  reports that she has never smoked. She has never used smokeless tobacco. She reports current alcohol use. She reports that she does not use drugs.    Current Outpatient Medications:    Ascorbic Acid (VITAMIN C) 100 MG tablet, Take 1,000 mg by mouth 2 (two) times daily. , Disp: , Rfl:    Calcium Carbonate-Vit D-Min (CALCIUM 1200 PO), Take 1 tablet by mouth., Disp: , Rfl:    Cholecalciferol (VITAMIN D3) 125 MCG (5000 UT) TABS, Take 5,000 Units by mouth., Disp: , Rfl:    CINNAMON PO, Take 1 tablet by mouth daily., Disp: , Rfl:    CRANBERRY CONCENTRATE PO, Take 1 tablet by mouth., Disp: , Rfl:    Cyanocobalamin (VITAMIN B 12) 500 MCG TABS, Take by mouth., Disp: , Rfl:    Multiple Vitamin (MULTIVITAMIN) tablet, Take 1 tablet by mouth daily., Disp: , Rfl:    propranolol (INDERAL) 10 MG tablet, Take 1 tablet (10 mg total) by mouth 3 (three) times daily. As needed for rapid heart rate, Disp: 30 tablet, Rfl: 0   sertraline (ZOLOFT) 50 MG tablet, TAKE 1 TABLET BY MOUTH DAILY., Disp: 90 tablet, Rfl: 1   traZODone (DESYREL) 50 MG tablet, TAKE 1/2 TO  1 TABLET BY MOUTH AT [facility] NEEDED FOR SLEEP, Disp: 30 tablet, Rfl: 3   ALPRAZolam (XANAX) 0.25 MG tablet, Take 1 tablet (0.25 mg total) by mouth at bedtime as needed for anxiety. (Patient not taking: Reported on 10/21/2023), Disp: 30 tablet, Rfl: 5  EXAM:  VITALS per patient if applicable:  GENERAL: alert, oriented, appears well and in no acute distress  HEENT: atraumatic, conjunttiva clear, no obvious abnormalities on inspection of external nose and ears  NECK: normal movements of the head and neck  LUNGS: on inspection no signs of respiratory distress, breathing rate appears normal, no obvious gross SOB, gasping or wheezing  CV: no obvious cyanosis  MS: moves all visible extremities without noticeable abnormality  PSYCH/NEURO: pleasant  and cooperative, no obvious depression or anxiety, speech and thought processing grossly intact  ASSESSMENT AND PLAN: Other osteoporosis without current pathological fracture Assessment & Plan: She has historically declined treatment  In favor of dietary and exercise ; however her T scores have become quite low.  Discussed treatment options,  Calcium and Vit d requirements.  Prolia chosen due to convenience and T scores,  if declined by insurance ,  Evista will be prescribed         I discussed the assessment and treatment plan with the patient. The patient was provided an opportunity to ask questions and all were answered. The patient agreed with the plan and demonstrated an understanding of the instructions.   The patient was advised to call back or seek an in-person evaluation if the symptoms worsen or if the condition fails to improve as anticipated.   I spent 30 minutes dedicated to the care of this patient on the date of this encounter to include pre-visit review of her recent DEXA scan counselling about osteoporosis management   Face-to-face time with the patient , and post visit ordering of testing and therapeutics.    Sherlene Shams, MD

## 2023-10-22 MED ORDER — DENOSUMAB 60 MG/ML ~~LOC~~ SOSY
60.0000 mg | PREFILLED_SYRINGE | Freq: Once | SUBCUTANEOUS | Status: AC
  Administered 2023-11-18: 60 mg via SUBCUTANEOUS

## 2023-10-22 NOTE — Addendum Note (Signed)
 Addended by: Warden Fillers on: 10/22/2023 04:43 PM   Modules accepted: Orders

## 2023-10-22 NOTE — Assessment & Plan Note (Signed)
 She has historically declined treatment  In favor of dietary and exercise ; however her T scores have become quite low.  Discussed treatment options,  Calcium and Vit d requirements.  Prolia chosen due to convenience and T scores,  if declined by insurance ,  Evista will be prescribed

## 2023-10-29 ENCOUNTER — Telehealth: Payer: Self-pay

## 2023-10-29 NOTE — Telephone Encounter (Signed)
 Prolia VOB initiated via [email or web address]

## 2023-10-30 NOTE — Telephone Encounter (Signed)
 Marland Kitchen

## 2023-10-30 NOTE — Telephone Encounter (Signed)
 Pharmacy Patient Advocate Encounter   Received notification from  [facility] Portal that prior authorization for PROLIA is required/requested.   Insurance verification completed.   The patient is insured through [employer] .   Per test claim: PA required; PA submitted to above mentioned insurance via [employer] Key/confirmation #/EOC  [id number] Status is pending

## 2023-10-31 ENCOUNTER — Other Ambulatory Visit (HOSPITAL_COMMUNITY): Payer: Self-pay

## 2023-10-31 NOTE — Telephone Encounter (Signed)
 Pt ready for scheduling for PROLIA on or after : 10/31/23  Option# 1: Buy/Bill (Office supplied medication)  Out-of-pocket cost due at time of clinic visit: $102.54  Number of injection/visits approved: 2  Primary: [employer] Prolia co-insurance: $40 Admin fee co-insurance: $40  Secondary: --- Prolia co-insurance:  Admin fee co-insurance:   Medical Benefit Details: Date Benefits were checked: 10/29/23 Deductible: $177.46 Met of $200 Required/ Coinsurance: $40/ Admin Fee: $40  Prior Auth: APPROVED [id number]  Expiration Date: 10/30/23-10/29/24   # of doses approved: 2 ----------------------------------------------------------------------- Option# 2- Med Obtained from pharmacy:  Pharmacy benefit: Copay $60 (Paid to pharmacy) Admin Fee: $40 (Pay at clinic)  Prior Auth: N/A PA# Expiration Date:   # of doses approved:   If patient wants fill through the pharmacy benefit please send prescription to: [employer], and include estimated need by date in rx notes. Pharmacy will ship medication directly to the office.  Patient NOT eligible for Prolia Copay Card. Copay Card can make patient's cost as little as $25. Link to apply: [email or web address]://[email or web address]  ** This summary of benefits is an estimation of the patient's out-of-pocket cost. Exact cost may very based on individual plan coverage.

## 2023-11-05 ENCOUNTER — Encounter: Payer: Self-pay | Admitting: Internal Medicine

## 2023-11-18 ENCOUNTER — Ambulatory Visit (INDEPENDENT_AMBULATORY_CARE_PROVIDER_SITE_OTHER)

## 2023-11-18 DIAGNOSIS — M818 Other osteoporosis without current pathological fracture: Secondary | ICD-10-CM

## 2023-11-18 DIAGNOSIS — M810 Age-related osteoporosis without current pathological fracture: Secondary | ICD-10-CM | POA: Diagnosis not present

## 2023-11-18 MED ORDER — DENOSUMAB 60 MG/ML ~~LOC~~ SOSY
60.0000 mg | PREFILLED_SYRINGE | SUBCUTANEOUS | Status: AC
Start: 2024-05-19 — End: ?
  Administered 2024-05-25: 60 mg via SUBCUTANEOUS

## 2023-11-18 NOTE — Progress Notes (Signed)
 Pt presented for their subcutaneous Prolia injection. Pt was identified through two identifiers. Pt was given the information packets about the Prolia and told to schedule their next injection 6 months out. Pt tolerated the subq injection well in the left arm.   Due to this being pts first injection pt was told to wait for 15 minutes for any reactions.    I checked on pt periodically and she verbalized she felt fine each time.    Pt was advised that if she does develop any symptoms after leaving the office to give [place] a call if she notices any redness near injection sight/ arm pain to let us know. Pt verbalized understanding and was discharged.

## 2023-12-23 ENCOUNTER — Ambulatory Visit (INDEPENDENT_AMBULATORY_CARE_PROVIDER_SITE_OTHER): Payer: Medicare HMO | Admitting: Internal Medicine

## 2023-12-23 ENCOUNTER — Encounter: Payer: Self-pay | Admitting: Internal Medicine

## 2023-12-23 ENCOUNTER — Ambulatory Visit (INDEPENDENT_AMBULATORY_CARE_PROVIDER_SITE_OTHER)

## 2023-12-23 ENCOUNTER — Other Ambulatory Visit: Payer: Self-pay | Admitting: Internal Medicine

## 2023-12-23 VITALS — BP 122/76 | HR 75 | Ht 59.0 in | Wt 104.0 lb

## 2023-12-23 DIAGNOSIS — E559 Vitamin D deficiency, unspecified: Secondary | ICD-10-CM

## 2023-12-23 DIAGNOSIS — E785 Hyperlipidemia, unspecified: Secondary | ICD-10-CM | POA: Diagnosis not present

## 2023-12-23 DIAGNOSIS — J300 Vasomotor rhinitis: Secondary | ICD-10-CM

## 2023-12-23 DIAGNOSIS — R5383 Other fatigue: Secondary | ICD-10-CM | POA: Diagnosis not present

## 2023-12-23 DIAGNOSIS — R053 Chronic cough: Secondary | ICD-10-CM | POA: Diagnosis not present

## 2023-12-23 DIAGNOSIS — R7303 Prediabetes: Secondary | ICD-10-CM | POA: Diagnosis not present

## 2023-12-23 DIAGNOSIS — F411 Generalized anxiety disorder: Secondary | ICD-10-CM

## 2023-12-23 DIAGNOSIS — K219 Gastro-esophageal reflux disease without esophagitis: Secondary | ICD-10-CM | POA: Insufficient documentation

## 2023-12-23 DIAGNOSIS — G4452 New daily persistent headache (NDPH): Secondary | ICD-10-CM

## 2023-12-23 MED ORDER — PANTOPRAZOLE SODIUM 40 MG PO TBEC
40.0000 mg | DELAYED_RELEASE_TABLET | Freq: Every day | ORAL | 3 refills | Status: DC
Start: 2023-12-23 — End: 2024-06-19

## 2023-12-23 NOTE — Progress Notes (Unsigned)
 Subjective:  Patient ID: Debbie Sanchez, female    DOB: 1956-04-14  Age: 68 y.o. MRN: [id number]  CC: The primary encounter diagnosis was Vitamin D  deficiency. Diagnoses of Hyperlipidemia, unspecified hyperlipidemia type, Other fatigue, and Prediabetes were also pertinent to this visit.   HPI Debbie Sanchez presents for  Chief Complaint  Patient presents with   Medical Management of Chronic Issues    6 month follow up    Rhinorrhea for the past 6 months  Cough for the past year    Outpatient Medications Prior to Visit  Medication Sig Dispense Refill   Ascorbic Acid (VITAMIN C) 100 MG tablet Take 1,000 mg by mouth 2 (two) times daily.      Calcium Carbonate-Vit D-Min (CALCIUM 1200 PO) Take 1 tablet by mouth.     Cholecalciferol (VITAMIN D3) 125 MCG (5000 UT) TABS Take 5,000 Units by mouth.     CINNAMON PO Take 1 tablet by mouth daily.     CRANBERRY CONCENTRATE PO Take 1 tablet by mouth.     Cyanocobalamin  (VITAMIN B 12) 500 MCG TABS Take by mouth.     Multiple Vitamin (MULTIVITAMIN) tablet Take 1 tablet by mouth daily.     propranolol  (INDERAL ) 10 MG tablet Take 1 tablet (10 mg total) by mouth 3 (three) times daily. As needed for rapid heart rate 30 tablet 0   sertraline  (ZOLOFT ) 50 MG tablet TAKE 1 TABLET BY MOUTH DAILY. 90 tablet 1   traZODone  (DESYREL ) 50 MG tablet TAKE 1/2 TO 1 TABLET BY MOUTH AT BEDTIMEAS NEEDED FOR SLEEP 30 tablet 3   ALPRAZolam  (XANAX ) 0.25 MG tablet Take 1 tablet (0.25 mg total) by mouth at bedtime as needed for anxiety. (Patient not taking: Reported on 12/23/2023) 30 tablet 5   Facility-Administered Medications Prior to Visit  Medication Dose Route Frequency Provider Last Rate Last Admin   [START ON 05/19/2024] denosumab  (PROLIA ) injection 60 mg  60 mg Subcutaneous Q6 months Thersia Flax, MD        Review of Systems;  Patient denies headache, fevers, malaise, unintentional weight loss, skin rash, eye pain, sinus congestion and sinus pain, sore throat,  dysphagia,  hemoptysis , cough, dyspnea, wheezing, chest pain, palpitations, orthopnea, edema, abdominal pain, nausea, melena, diarrhea, constipation, flank pain, dysuria, hematuria, urinary  Frequency, nocturia, numbness, tingling, seizures,  Focal weakness, Loss of consciousness,  Tremor, insomnia, depression, anxiety, and suicidal ideation.      Objective:  BP 122/76   Pulse 75   Ht 4\' 11"  (1.499 m)   Wt 104 lb (47.2 kg)   SpO2 96%   BMI 21.01 kg/m   BP Readings from Last 3 Encounters:  12/23/23 122/76  06/24/23 114/70  06/24/23 114/70    Wt Readings from Last 3 Encounters:  12/23/23 104 lb (47.2 kg)  10/21/23 100 lb 12.8 oz (45.7 kg)  06/24/23 100 lb 12.8 oz (45.7 kg)    Physical Exam  Lab Results  Component Value Date   HGBA1C 5.7 01/16/2023   HGBA1C 6.2 06/18/2022   HGBA1C 6.0 06/06/2020    Lab Results  Component Value Date   CREATININE 0.61 01/16/2023   CREATININE 0.52 06/18/2022   CREATININE 0.80 01/02/2022    Lab Results  Component Value Date   WBC 6.6 01/16/2023   HGB 13.7 01/16/2023   HCT 41.9 01/16/2023   PLT 285.0 01/16/2023   GLUCOSE 96 01/16/2023   CHOL 204 (H) 01/16/2023   TRIG 56.0 01/16/2023   HDL 77.40 01/16/2023  LDLDIRECT 115.0 01/16/2023   LDLCALC 115 (H) 01/16/2023   ALT 25 01/16/2023   AST 20 01/16/2023   NA 141 01/16/2023   K 4.1 01/16/2023   CL 102 01/16/2023   CREATININE 0.61 01/16/2023   BUN 15 01/16/2023   CO2 31 01/16/2023   TSH 1.17 01/16/2023   HGBA1C 5.7 01/16/2023    MM 3D SCREENING MAMMOGRAM BILATERAL BREAST Result Date: 10/04/2023 CLINICAL DATA:  Screening. EXAM: DIGITAL SCREENING BILATERAL MAMMOGRAM WITH TOMOSYNTHESIS AND CAD TECHNIQUE: Bilateral screening digital craniocaudal and mediolateral oblique mammograms were obtained. Bilateral screening digital breast tomosynthesis was performed. The images were evaluated with computer-aided detection. COMPARISON:  Previous exam(s). ACR Breast Density Category b: There  are scattered areas of fibroglandular density. FINDINGS: There are no findings suspicious for malignancy. IMPRESSION: No mammographic evidence of malignancy. A result letter of this screening mammogram will be mailed directly to the patient. RECOMMENDATION: Screening mammogram in one year. (Code:SM-B-01Y) BI-RADS CATEGORY  1: Negative. Electronically Signed   By: Dobrinka  Dimitrova M.D.   On: 10/04/2023 13:12   DG Bone Density Result Date: 10/02/2023 EXAM: DUAL X-RAY ABSORPTIOMETRY (DXA) FOR BONE MINERAL DENSITY IMPRESSION: Your patient Debbie Sanchez completed a BMD test on 10/02/2023 using the [employer] DXA System (software version: 14.10) manufactured by [employer]. The following summarizes the results of our evaluation. Technologist: [facility] PATIENT BIOGRAPHICAL: Name: Debbie Sanchez Patient ID: [id number] Birth Date: 02/20/1956 Height: 59.0 in. Gender: Female Exam Date: 10/02/2023 Weight: 103.6 lbs. Indications: Postmenopausal, Caucasian Fractures: Treatments: Calcium, Multi-Vitamin, Vitamin D  DENSITOMETRY RESULTS: Site      Region     Measured Date Measured Age WHO Classification Young Adult T-score BMD         %Change vs. Previous Significant Change (*) AP Spine L1-L4 10/02/2023 67.3 Osteoporosis -2.5 0.886 g/cm2 -3.9% Yes AP Spine L1-L4 08/09/2021 65.2 Osteopenia -2.2 0.922 g/cm2 - - DualFemur Total Left 10/02/2023 67.3 Osteoporosis -3.4 0.583 g/cm2 -4.9% Yes DualFemur Total Left 08/09/2021 65.2 Osteoporosis -3.1 0.613 g/cm2 - - DualFemur Total Mean 10/02/2023 67.3 Osteoporosis -3.2 0.609 g/cm2 -2.4% - DualFemur Total Mean 08/09/2021 65.2 Osteoporosis -3.0 0.624 g/cm2 - - ASSESSMENT: The BMD measured at Femur Total Left is 0.583 g/cm2 with a T-score of -3.4. This patient is considered osteoporotic according to World Health Organization [facility]) criteria. The scan quality is good. Compared with prior study, there has been significant decrease in the spine. Compared with prior study, there has been no  significant change in the total hip. World Science writer [facility]) criteria for post-menopausal, Caucasian Women: Normal:                   T-score at or above -1 SD Osteopenia/low bone mass: T-score between -1 and -2.5 SD Osteoporosis:             T-score at or below -2.5 SD RECOMMENDATIONS: 1. All patients should optimize calcium and vitamin D  intake. 2. Consider FDA-approved medical therapies in postmenopausal women and men aged 68 years and older, based on the following: a. A hip or vertebral(clinical or morphometric) fracture b. T-score < -2.5 at the femoral neck or spine after appropriate evaluation to exclude secondary causes c. Low bone mass (T-score between -1.0 and -2.5 at the femoral neck or spine) and a 10-year probability of a hip fracture > 3% or a 10-year probability of a major osteoporosis-related fracture > 20% based on the [place] -adapted WHO algorithm 3. Clinician judgment and/or patient preferences may indicate treatment for people with 10-year  fracture probabilities above or below these levels FOLLOW-UP: People with diagnosed cases of osteoporosis or at high risk for fracture should have regular bone mineral density tests. For patients eligible for Medicare, routine testing is allowed once every 2 years. The testing frequency can be increased to one year for patients who have rapidly progressing disease, those who are receiving or discontinuing medical therapy to restore bone mass, or have additional risk factors. I have reviewed this report, and agree with the above findings. [facility] Electronically Signed   By: Tita Form M.D.   On: 10/02/2023 13:06    Assessment & Plan:  .Vitamin D  deficiency  Hyperlipidemia, unspecified hyperlipidemia type -     Comprehensive metabolic panel with GFR -     LDL cholesterol, direct -     Lipid panel  Other fatigue  Prediabetes     I spent 34 minutes on the day of this face to face encounter reviewing patient's  most recent  visit with cardiology,  nephrology,  and neurology,  prior relevant surgical and non surgical procedures, recent  labs and imaging studies, counseling on weight management,  reviewing the assessment and plan with patient, and post visit ordering and reviewing of  diagnostics and therapeutics with patient  .   Follow-up: No follow-ups on file.   Thersia Flax, MD

## 2023-12-23 NOTE — Assessment & Plan Note (Signed)
 Did not respond to atrovent  .   Previous allergy testing positive to dust mites and cockroaches .  needs to see ENT

## 2023-12-23 NOTE — Assessment & Plan Note (Addendum)
 Occipital,  with  previous  MRI brain doNE to rule out masses,  CVA's. Her pain has largely  resolved after paying attention to neck positioning   DUE TO work related positioning  of head
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
## 2023-12-23 NOTE — Assessment & Plan Note (Signed)
 Pantoprazole trial since omeprazola  reportedly did not work

## 2023-12-23 NOTE — Patient Instructions (Addendum)
 Trial of pantoprazole for GERD related symptoms: please   take 30 minutes prior to eating or drinking  Chest x ray today   Referring to [facility]NT for evaluation of sinuses   I recommend flushing sinuses after outside work

## 2023-12-24 ENCOUNTER — Encounter: Payer: Self-pay | Admitting: Internal Medicine

## 2023-12-24 LAB — TSH: TSH: 1.63 u[IU]/mL (ref 0.35–5.50)

## 2023-12-24 LAB — LIPID PANEL
Cholesterol: 250 mg/dL — ABNORMAL HIGH (ref 0–200)
HDL: 70.9 mg/dL (ref >39.00)
LDL Cholesterol: 151 mg/dL — ABNORMAL HIGH (ref 0–99)
NonHDL: 179.52
Total CHOL/HDL Ratio: 4
Triglycerides: 145 mg/dL (ref 0.0–149.0)
VLDL: 29 mg/dL (ref 0.0–40.0)

## 2023-12-24 LAB — CBC WITH DIFFERENTIAL/PLATELET
Basophils Absolute: 0 10*3/uL (ref 0.0–0.1)
Basophils Relative: 0.6 % (ref 0.0–3.0)
Eosinophils Absolute: 0.1 10*3/uL (ref 0.0–0.7)
Eosinophils Relative: 1.2 % (ref 0.0–5.0)
HCT: 43.7 % (ref 36.0–46.0)
Hemoglobin: 14.4 g/dL (ref 12.0–15.0)
Lymphocytes Relative: 23.2 % (ref 12.0–46.0)
Lymphs Abs: 1.6 10*3/uL (ref 0.7–4.0)
MCHC: 32.9 g/dL (ref 30.0–36.0)
MCV: 93.1 fl (ref 78.0–100.0)
Monocytes Absolute: 0.6 10*3/uL (ref 0.1–1.0)
Monocytes Relative: 8.8 % (ref 3.0–12.0)
Neutro Abs: 4.5 10*3/uL (ref 1.4–7.7)
Neutrophils Relative %: 66.2 % (ref 43.0–77.0)
Platelets: 271 10*3/uL (ref 150.0–400.0)
RBC: 4.69 Mil/uL (ref 3.87–5.11)
RDW: 12.7 % (ref 11.5–15.5)
WBC: 6.8 10*3/uL (ref 4.0–10.5)

## 2023-12-24 LAB — COMPREHENSIVE METABOLIC PANEL WITH GFR
ALT: 15 U/L (ref 0–35)
AST: 19 U/L (ref 0–37)
Albumin: 4.1 g/dL (ref 3.5–5.2)
Alkaline Phosphatase: 52 U/L (ref 39–117)
BUN: 17 mg/dL (ref 6–23)
CO2: 29 meq/L (ref 19–32)
Calcium: 9.4 mg/dL (ref 8.4–10.5)
Chloride: 102 meq/L (ref 96–112)
Creatinine, Ser: 0.59 mg/dL (ref 0.40–1.20)
GFR: 93.12 mL/min (ref >60.00)
Glucose, Bld: 91 mg/dL (ref 70–99)
Potassium: 4.2 meq/L (ref 3.5–5.1)
Sodium: 140 meq/L (ref 135–145)
Total Bilirubin: 0.6 mg/dL (ref 0.2–1.2)
Total Protein: 7 g/dL (ref 6.0–8.3)

## 2023-12-24 LAB — VITAMIN D 25 HYDROXY (VIT D DEFICIENCY, FRACTURES): VITD: 65.47 ng/mL (ref 30.00–100.00)

## 2023-12-24 LAB — LDL CHOLESTEROL, DIRECT: Direct LDL: 148 mg/dL

## 2023-12-24 LAB — HEMOGLOBIN A1C: Hgb A1c MFr Bld: 5.8 % (ref 4.6–6.5)

## 2023-12-24 NOTE — Telephone Encounter (Signed)
 Refilled: 06/24/2023 Last OV: 12/23/2023 Next OV: not scheduled

## 2024-01-16 ENCOUNTER — Ambulatory Visit: Payer: Medicare HMO | Admitting: Dermatology

## 2024-02-03 ENCOUNTER — Encounter: Payer: Self-pay | Admitting: Dermatology

## 2024-02-03 ENCOUNTER — Ambulatory Visit: Admitting: Dermatology

## 2024-02-03 DIAGNOSIS — L821 Other seborrheic keratosis: Secondary | ICD-10-CM

## 2024-02-03 DIAGNOSIS — Z86018 Personal history of other benign neoplasm: Secondary | ICD-10-CM

## 2024-02-03 DIAGNOSIS — Z79899 Other long term (current) drug therapy: Secondary | ICD-10-CM

## 2024-02-03 DIAGNOSIS — L814 Other melanin hyperpigmentation: Secondary | ICD-10-CM | POA: Diagnosis not present

## 2024-02-03 DIAGNOSIS — L570 Actinic keratosis: Secondary | ICD-10-CM

## 2024-02-03 DIAGNOSIS — L578 Other skin changes due to chronic exposure to nonionizing radiation: Secondary | ICD-10-CM

## 2024-02-03 DIAGNOSIS — Z1283 Encounter for screening for malignant neoplasm of skin: Secondary | ICD-10-CM

## 2024-02-03 DIAGNOSIS — W908XXA Exposure to other nonionizing radiation, initial encounter: Secondary | ICD-10-CM | POA: Diagnosis not present

## 2024-02-03 DIAGNOSIS — R202 Paresthesia of skin: Secondary | ICD-10-CM

## 2024-02-03 DIAGNOSIS — Z7189 Other specified counseling: Secondary | ICD-10-CM

## 2024-02-03 DIAGNOSIS — D1801 Hemangioma of skin and subcutaneous tissue: Secondary | ICD-10-CM

## 2024-02-03 DIAGNOSIS — D229 Melanocytic nevi, unspecified: Secondary | ICD-10-CM

## 2024-02-03 MED ORDER — LIDOCAINE 5 % EX CREA: TOPICAL_CREAM | CUTANEOUS | 11 refills | Status: AC

## 2024-02-03 NOTE — Patient Instructions (Addendum)
 Start [facility]  Amitriptyline 5% / Gabapentin 10% / Lidocaine  5% Cream once or twice a day as needed for itching on back.    Instructions for [facility] Medications  One or more of your medications was sent to the [facility] mail order compounding pharmacy. You will receive an email from them and can purchase the medicine through that link. It will then be mailed to your home at the address you confirmed. If for any reason you do not receive an email from them, please check your spam folder. If you still do not find the email, please let us  know. [facility] phone number is [phone].   NOTALGIA PARESTHETICA  Chronic condition without cure secondary to pinched nerve along spine causing itching or sensation changes in an area of skin. Chronic rubbing or scratching causes darkening of the skin.  OTC treatments which can help with itch include numbing creams like pramoxine or lidocaine  which temporarily reduce itch or Capsaicin-containing creams which cause a burning sensation but which sometimes over time will reset the nerves to stop producing itch.  If you choose to use Capsaicin cream, it is recommended to use it 5 times daily for 1 week followed by 3 times daily for 3-6 weeks. You may have to continue using it long-term.  If not doing well with OTC options, could consider [facility] compounded prescription anti-itch cream with Amitriptyline 5% / Lidocaine  5% / Pramoxine 1% or Amitriptyline 5% / Gabapentin 10% / Lidocaine  5% Cream or other prescription cream or pill options.   Recommend daily broad spectrum sunscreen SPF 30+ to sun-exposed areas, reapply every 2 hours as needed. Call for new or changing lesions.  Staying in the shade or wearing long sleeves, sun glasses (UVA+UVB protection) and wide brim hats (4-inch brim around the entire circumference of the hat) are also recommended for sun protection.      Melanoma ABCDEs  Melanoma is the most dangerous type of  skin cancer, and is the leading cause of death from skin disease.  You are more likely to develop melanoma if you: Have light-colored skin, light-colored eyes, or red or blond hair Spend a lot of time in the sun Tan regularly, either outdoors or in a tanning bed Have had blistering sunburns, especially during childhood Have a close family member who has had a melanoma Have atypical moles or large birthmarks  Early detection of melanoma is key since treatment is typically straightforward and cure rates are extremely high if we catch it early.   The first sign of melanoma is often a change in a mole or a new dark spot.  The ABCDE system is a way of remembering the signs of melanoma.  A for asymmetry:  The two halves do not match. B for border:  The edges of the growth are irregular. C for color:  A mixture of colors are present instead of an even brown color. D for diameter:  Melanomas are usually (but not always) greater than 6mm - the size of a pencil eraser. E for evolution:  The spot keeps changing in size, shape, and color.  Please check your skin once per month between visits. You can use a small mirror in front and a large mirror behind you to keep an eye on the back side or your body.   If you see any new or changing lesions before your next follow-up, please call to schedule a visit.  Please continue daily skin protection including broad spectrum sunscreen SPF  30+ to sun-exposed areas, reapplying every 2 hours as needed when you're outdoors.   Staying in the shade or wearing long sleeves, sun glasses (UVA+UVB protection) and wide brim hats (4-inch brim around the entire circumference of the hat) are also recommended for sun protection.     Due to recent changes in healthcare laws, you may see results of your pathology and/or laboratory studies on MyChart before the doctors have had a chance to review them. We understand that in some cases there may be results that are confusing or  concerning to you. Please understand that not all results are received at the same time and often the doctors may need to interpret multiple results in order to provide you with the best plan of care or course of treatment. Therefore, we ask that you please give us  2 business days to thoroughly review all your results before contacting the office for clarification. Should we see a critical lab result, you will be contacted sooner.   If You Need Anything After Your Visit  If you have any questions or concerns for your doctor, please call our main line at [phone] and press option 4 to reach your doctor's medical assistant. If no one answers, please leave a voicemail as directed and we will return your call as soon as possible. Messages left after 4 pm will be answered the following business day.   You may also send us  a message via MyChart. We typically respond to MyChart messages within 1-2 business days.  For prescription refills, please ask your pharmacy to contact our office. Our fax number is [phone].  If you have an urgent issue when the clinic is closed that cannot wait until the next business day, you can page your doctor at the number below.    Please note that while we do our best to be available for urgent issues outside of office hours, we are not available 24/7.   If you have an urgent issue and are unable to reach us , you may choose to seek medical care at your doctor's office, retail clinic, urgent care center, or emergency room.  If you have a medical emergency, please immediately call 911 or go to the emergency department.  Pager Numbers  - Dr. Bary Likes: [phone]  - Dr. Annette Barters: [phone]  - Dr. Felipe Horton: [phone]   In the event of inclement weather, please call our main line at [phone] for an update on the status of any delays or closures.  Dermatology Medication Tips: Please keep the boxes that topical medications come in in order to help keep  track of the instructions about where and how to use these. Pharmacies typically print the medication instructions only on the boxes and not directly on the medication tubes.   If your medication is too expensive, please contact our office at [phone] option 4 or send us  a message through MyChart.   We are unable to tell what your co-pay for medications will be in advance as this is different depending on your insurance coverage. However, we may be able to find a substitute medication at lower cost or fill out paperwork to get insurance to cover a needed medication.   If a prior authorization is required to get your medication covered by your insurance company, please allow us  1-2 business days to complete this process.  Drug prices often vary depending on where the prescription is filled and some pharmacies may offer cheaper prices.  The website www.goodrx.com contains coupons for medications through  different pharmacies. The prices here do not account for what the cost may be with help from insurance (it may be cheaper with your insurance), but the website can give you the price if you did not use any insurance.  - You can print the associated coupon and take it with your prescription to the pharmacy.  - You may also stop by our office during regular business hours and pick up a GoodRx coupon card.  - If you need your prescription sent electronically to a different pharmacy, notify our office through [facility] or by phone at [phone] option 4.     Si Usted Necesita Algo Despus de Su Visita  Tambin puede enviarnos un mensaje a travs de Clinical cytogeneticist. Por lo general respondemos a los mensajes de MyChart en el transcurso de 1 a 2 das hbiles.  Para renovar recetas, por favor pida a su farmacia que se ponga en contacto con nuestra oficina. Franz Jacks de fax es [place] [phone].  Si tiene un asunto urgente cuando la clnica est cerrada y que no puede esperar hasta el  siguiente da hbil, puede llamar/localizar a su doctor(a) al nmero que aparece a continuacin.   Por favor, tenga en cuenta que aunque hacemos todo lo posible para estar disponibles para asuntos urgentes fuera del horario de [place], no estamos disponibles las 24 horas del da, los 7 [address]  [address] de la [place].   Si tiene un problema urgente y no puede comunicarse con nosotros, puede optar por buscar atencin mdica  en el consultorio de su doctor(a), en una clnica privada, en un centro de atencin urgente o en una sala de emergencias.  Si tiene Engineer, drilling, por favor llame inmediatamente al 911 o vaya a la sala de emergencias.  Nmeros de bper  - Dr. Bary Likes: [phone]  - Dra. Annette Barters: [phone]  - Dr. Felipe Horton: [phone]   En caso de inclemencias del tiempo, por favor llame a Lajuan Pila principal al [phone] para una actualizacin sobre el [place] de cualquier retraso o cierre.  Consejos para la medicacin en dermatologa: Por favor, guarde las cajas en las que vienen los medicamentos de uso tpico para ayudarle a seguir las instrucciones sobre dnde y cmo usarlos. Las farmacias generalmente imprimen las instrucciones del medicamento slo en las cajas y no directamente en los tubos del [place].   Si su medicamento es muy caro, por favor, pngase en contacto con Bettyjane Brunet llamando al [phone] y presione la opcin 4 o envenos un mensaje a travs de Clinical cytogeneticist.   No podemos decirle cul ser su copago por los medicamentos por adelantado ya que esto es diferente dependiendo de la cobertura de su seguro. Sin embargo, es posible que podamos encontrar un medicamento sustituto a Audiological scientist un formulario para que el seguro cubra el medicamento que se considera necesario.   Si se requiere una autorizacin previa para que su compaa de seguros [place] su medicamento, por favor permtanos de 1 a 2 das hbiles para completar este proceso.  Los precios de los  medicamentos varan con frecuencia dependiendo del Environmental consultant de dnde se surte la receta y alguna farmacias pueden ofrecer precios ms baratos.  El sitio web www.goodrx.com tiene cupones para medicamentos de Health and safety inspector. Los precios aqu no tienen en cuenta lo que podra costar con la ayuda del seguro (puede ser ms barato con su seguro), pero el sitio web puede darle el precio si no utiliz Tourist information centre manager.  - Puede imprimir el cupn correspondiente y llevarlo  con su receta a la farmacia.  - Tambin puede pasar por nuestra oficina durante el horario de atencin regular y Education officer, museum una tarjeta de cupones de GoodRx.  - Si necesita que su receta se enve electrnicamente a una farmacia diferente, informe a nuestra oficina a travs de MyChart de [place] o por telfono llamando al [phone] y presione la opcin 4.

## 2024-02-03 NOTE — Progress Notes (Signed)
 Follow-Up Visit   Subjective  Debbie Sanchez is a 68 y.o. female who presents for the following: Skin Cancer Screening and Full Body Skin Exam. Hx of dysplastic nevi. No personal Hx of skin cancer.   Area of right upper shoulder. Itching 4-6 months. No rash, nothing on skin.   The patient presents for Total-Body Skin Exam (TBSE) for skin cancer screening and mole check. The patient has spots, moles and lesions to be evaluated, some may be new or changing and the patient may have concern these could be cancer.  The following portions of the chart were reviewed this encounter and updated as appropriate: medications, allergies, medical history  Review of Systems:  No other skin or systemic complaints except as noted in HPI or Assessment and Plan.  Objective  Well appearing patient in no apparent distress; mood and affect are within normal limits.  A full examination was performed including scalp, head, eyes, ears, nose, lips, neck, chest, axillae, abdomen, back, buttocks, bilateral upper extremities, bilateral lower extremities, hands, feet, fingers, toes, fingernails, and toenails. All findings within normal limits unless otherwise noted below.   Relevant physical exam findings are noted in the Assessment and Plan.  Right Hand - Dorsum x1 Erythematous thin papules/macules with gritty scale.   Assessment & Plan   SKIN CANCER SCREENING PERFORMED TODAY.  HISTORY OF DYSPLASTIC NEVI No evidence of recurrence today Recommend regular full body skin exams Recommend daily broad spectrum sunscreen SPF 30+ to sun-exposed areas, reapply every 2 hours as needed.  Call if any new or changing lesions are noted between office visits  ACTINIC DAMAGE - Chronic condition, secondary to cumulative UV/sun exposure - diffuse scaly erythematous macules with underlying dyspigmentation - Recommend daily broad spectrum sunscreen SPF 30+ to sun-exposed areas, reapply every 2 hours as needed.  - Staying in  the shade or wearing long sleeves, sun glasses (UVA+UVB protection) and wide brim hats (4-inch brim around the entire circumference of the hat) are also recommended for sun protection.  - Call for new or changing lesions.  LENTIGINES, SEBORRHEIC KERATOSES, HEMANGIOMAS - Benign normal skin lesions - Benign-appearing - Call for any changes  MELANOCYTIC NEVI - Tan-brown and/or pink-flesh-colored symmetric macules and papules - Benign appearing on exam today - Observation - Call clinic for new or changing moles - Recommend daily use of broad spectrum spf 30+ sunscreen to sun-exposed areas.   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 Chronic rubbing or scratching causes darkening of the skin.  OTC treatments which can help with itch include numbing creams like pramoxine or lidocaine  which temporarily reduce itch or Capsaicin-containing creams which cause a burning sensation but which sometimes over time will reset the nerves to stop producing itch.  If you choose to use Capsaicin cream, it is recommended to use it 5 times daily for 1 week followed by 3 times daily for 3-6 weeks. You may have to continue using it long-term.  If not doing well with OTC options Start Skin Medicinals  Amitriptyline 5% / Gabapentin 10% / Lidocaine  5% Cream once or twice a day as needed for itching on back.   AK (ACTINIC KERATOSIS) Right Hand - Dorsum x1 Actinic keratoses are precancerous spots that appear secondary to cumulative UV radiation exposure/sun exposure over time. They are chronic with expected duration over 1 year. A portion of actinic keratoses will  progress to squamous cell carcinoma of the skin. It is not possible to reliably predict which spots will progress to skin cancer and so treatment is recommended to prevent development of  skin cancer.  Recommend daily broad spectrum sunscreen SPF 30+ to sun-exposed areas, reapply every 2 hours as needed.  Recommend staying in the shade or wearing long sleeves, sun glasses (UVA+UVB protection) and wide brim hats (4-inch brim around the entire circumference of the hat). Call for new or changing lesions. Destruction of lesion - Right Hand - Dorsum x1 Complexity: simple   Destruction method: cryotherapy   Informed consent: discussed and consent obtained   Timeout:  patient name, date of birth, surgical site, and procedure verified Lesion destroyed using liquid nitrogen: Yes   Region frozen until ice ball extended beyond lesion: Yes   Cryo cycles: 1 or 2. Outcome: patient tolerated procedure well with no complications   Post-procedure details: wound care instructions given   Additional details:  Prior to procedure, discussed risks of blister formation, small wound, skin dyspigmentation, or rare scar following cryotherapy. Recommend Vaseline ointment to treated areas while healing.  ACTINIC SKIN DAMAGE   SKIN CANCER SCREENING   LENTIGO   MELANOCYTIC NEVUS, UNSPECIFIED LOCATION   HISTORY OF DYSPLASTIC NEVUS   COUNSELING AND COORDINATION OF CARE   MEDICATION MANAGEMENT   NOTALGIA PARESTHETICA   Return in about 1 year (around 02/02/2025) for TBSE, HxDN.  I, Jill Parcell, CMA, am acting as scribe for Celine Collard, MD.   Documentation: I have reviewed the above documentation for accuracy and completeness, and I agree with the above.  Celine Collard, MD

## 2024-02-24 ENCOUNTER — Other Ambulatory Visit: Payer: Self-pay | Admitting: Internal Medicine

## 2024-04-17 ENCOUNTER — Telehealth: Payer: Self-pay

## 2024-04-17 NOTE — Telephone Encounter (Signed)
 Prolia VOB initiated via [email or web address]  Next Prolia inj DUE: 05/18/24

## 2024-04-21 ENCOUNTER — Other Ambulatory Visit (HOSPITAL_COMMUNITY): Payer: Self-pay

## 2024-04-21 NOTE — Telephone Encounter (Signed)
 Pt ready for scheduling for PROLIA  on or after : 05/18/24  Option# 1: Buy/Bill (Office supplied medication)  Out-of-pocket cost due at time of clinic visit: $80  Number of injection/visits approved: 2  Primary: [employer] Prolia  co-insurance: $40 Admin fee co-insurance: $40  Secondary: --- Prolia  co-insurance:  Admin fee co-insurance:   Medical Benefit Details: Date Benefits were checked: 04/17/24 Deductible: $200 Met of $200 Required/ Coinsurance: $40/ Admin Fee: $40  Prior Auth: APPROVED PA# [id number] Expiration Date: 10/30/23-10/29/24   # of doses approved: 2 ----------------------------------------------------------------------- Option# 2- Med Obtained from pharmacy:  Pharmacy benefit: Copay $60 (Paid to pharmacy) Admin Fee: $40 (Pay at clinic)  Prior Auth: N/A PA# Expiration Date:   # of doses approved:   If patient wants fill through the pharmacy benefit please send prescription to: [facility], and include estimated need by date in rx notes. Pharmacy will ship medication directly to the office.  Patient NOT eligible for Prolia  Copay Card. Copay Card can make patient's cost as little as $25. Link to apply: [email or web address]  ** This summary of benefits is an estimation of the patient's out-of-pocket cost. Exact cost may very based on individual plan coverage.

## 2024-04-21 NOTE — Telephone Encounter (Signed)
 Debbie Sanchez

## 2024-04-25 ENCOUNTER — Other Ambulatory Visit: Payer: Self-pay | Admitting: Internal Medicine

## 2024-05-25 ENCOUNTER — Ambulatory Visit

## 2024-05-25 DIAGNOSIS — M810 Age-related osteoporosis without current pathological fracture: Secondary | ICD-10-CM

## 2024-05-25 MED ORDER — DENOSUMAB 60 MG/ML ~~LOC~~ SOSY: 60.0000 mg | PREFILLED_SYRINGE | SUBCUTANEOUS | Status: AC

## 2024-05-25 NOTE — Progress Notes (Signed)
 Pt presented for their subcutaneous Prolia injection. Pt was identified through two identifiers. Pt was given the information packets about the Prolia and told to schedule their next injection 6 months out. Pt tolerated the subq injection well in the left arm.

## 2024-05-26 ENCOUNTER — Other Ambulatory Visit: Payer: Self-pay | Admitting: Internal Medicine

## 2024-06-19 ENCOUNTER — Ambulatory Visit: Admitting: Internal Medicine

## 2024-06-19 ENCOUNTER — Encounter: Payer: Self-pay | Admitting: Internal Medicine

## 2024-06-19 VITALS — BP 104/78 | HR 106 | Ht 59.0 in | Wt 101.8 lb

## 2024-06-19 DIAGNOSIS — Z0000 Encounter for general adult medical examination without abnormal findings: Secondary | ICD-10-CM

## 2024-06-19 DIAGNOSIS — J300 Vasomotor rhinitis: Secondary | ICD-10-CM | POA: Diagnosis not present

## 2024-06-19 DIAGNOSIS — M5412 Radiculopathy, cervical region: Secondary | ICD-10-CM

## 2024-06-19 DIAGNOSIS — Z23 Encounter for immunization: Secondary | ICD-10-CM | POA: Diagnosis not present

## 2024-06-19 DIAGNOSIS — F411 Generalized anxiety disorder: Secondary | ICD-10-CM

## 2024-06-19 DIAGNOSIS — M4692 Unspecified inflammatory spondylopathy, cervical region: Secondary | ICD-10-CM

## 2024-06-19 MED ORDER — TRAZODONE HCL 50 MG PO TABS: ORAL_TABLET | ORAL | 3 refills | Status: AC

## 2024-06-19 NOTE — Patient Instructions (Signed)
 Glad you are doing better!  The RSV vaccine  is an excellent idea for anyone with asthma, chronic bronchitis, or COPD.  It is available and safe  for everyone over 65 but not as important as the annual flu for everyone else.    See you in 6 months

## 2024-06-19 NOTE — Progress Notes (Deleted)
 Subjective:  Patient ID: Debbie Sanchez, female    DOB: March 14, 1956  Age: 68 y.o. MRN: [id number]  CC: There were no encounter diagnoses.   HPI ALZINA GOLDA presents for  Chief Complaint  Patient presents with  . Medical Management of Chronic Issues   Follow up on rhinorrhea and cough:  last seen in May ,  chest x ray normal. GERD treatment started  .  Cough is still present but her sleep app has noticed variability in cough frequency .  Still has PND attributed to snoring,  saline nasal spray has helped .  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   Has a cleft uvula .  OSA not currently treated still looking for tolerable options     Neck and shoulder pain improved with change in pillows  GAD/insomnia:  using sertraline  and trazodone  . Improved with resolution of conflict with daughter who is now gainfully employed and has moved out into patient's maternal home . However her live- in  partner is depressed and she is working on helping him move out.    Outpatient Medications Prior to Visit  Medication Sig Dispense Refill  . ALPRAZolam  (XANAX ) 0.25 MG tablet TAKE ONE TABLET AT BEDTIME AS NEEDED FORANXIETY 30 tablet 1  . Ascorbic Acid (VITAMIN C) 100 MG tablet Take 1,000 mg by mouth 2 (two) times daily.     . Calcium Carbonate-Vit D-Min (CALCIUM 1200 PO) Take 1 tablet by mouth.    . Cholecalciferol (VITAMIN D3) 125 MCG (5000 UT) TABS Take 5,000 Units by mouth.    SABRA CINNAMON PO Take 1 tablet by mouth daily.    SABRA CRANBERRY CONCENTRATE PO Take 1 tablet by mouth.    . Cyanocobalamin  (VITAMIN B 12) 500 MCG TABS Take by mouth.    . Lidocaine  5 % CREA Apply once or twice daily as needed to itchy area on back 30 g 11  . Multiple Vitamin (MULTIVITAMIN) tablet Take 1 tablet by mouth daily.    . propranolol  (INDERAL ) 10 MG tablet Take 1 tablet (10 mg total) by mouth 3 (three) times daily. As needed for rapid heart rate 30 tablet 0  . sertraline  (ZOLOFT ) 50 MG tablet  TAKE 1 TABLET BY MOUTH DAILY. 90 tablet 1  . traZODone  (DESYREL ) 50 MG tablet TAKE 1/2 TO 1 TABLET BY MOUTH AT BEDTIMEAS NEEDED FOR SLEEP 30 tablet 3  . pantoprazole  (PROTONIX ) 40 MG tablet Take 1 tablet (40 mg total) by mouth daily. (Patient not taking: Reported on 06/19/2024) 30 tablet 3   Facility-Administered Medications Prior to Visit  Medication Dose Route Frequency Provider Last Rate Last Admin  . denosumab  (PROLIA ) injection 60 mg  60 mg Subcutaneous Q6 months Lucero Auzenne L, MD   60 mg at 05/25/24 1016  . [START ON 11/23/2024] denosumab  (PROLIA ) injection 60 mg  60 mg Subcutaneous Q6 months Marylynn Verneita CROME, MD        Review of Systems;  Patient denies headache, fevers, malaise, unintentional weight loss, skin rash, eye pain, sinus congestion and sinus pain, sore throat, dysphagia,  hemoptysis , cough, dyspnea, wheezing, chest pain, palpitations, orthopnea, edema, abdominal pain, nausea, melena, diarrhea, constipation, flank pain, dysuria, hematuria, urinary  Frequency, nocturia, numbness, tingling, seizures,  Focal weakness, Loss of consciousness,  Tremor,  depression,  and suicidal ideation.      Objective:  BP 104/78   Pulse (!) 106   Ht 4' 11 (1.499 m)  Wt 101 lb 12.8 oz (46.2 kg)   SpO2 99%   BMI 20.56 kg/m   BP Readings from Last 3 Encounters:  06/19/24 104/78  12/23/23 122/76  06/24/23 114/70    Wt Readings from Last 3 Encounters:  06/19/24 101 lb 12.8 oz (46.2 kg)  12/23/23 104 lb (47.2 kg)  10/21/23 100 lb 12.8 oz (45.7 kg)    Physical Exam Vitals reviewed.  Constitutional:      General: She is not in acute distress.    Appearance: Normal appearance. She is normal weight. She is not ill-appearing, toxic-appearing or diaphoretic.  HENT:     Head: Normocephalic.  Eyes:     General: No scleral icterus.       Right eye: No discharge.        Left eye: No discharge.     Conjunctiva/sclera: Conjunctivae normal.  Cardiovascular:     Rate and Rhythm: Normal  rate and regular rhythm.     Heart sounds: Normal heart sounds.  Pulmonary:     Effort: Pulmonary effort is normal. No respiratory distress.     Breath sounds: Normal breath sounds.  Musculoskeletal:        General: Normal range of motion.  Skin:    General: Skin is warm and dry.  Neurological:     General: No focal deficit present.     Mental Status: She is alert and oriented to person, place, and time. Mental status is at baseline.  Psychiatric:        Mood and Affect: Mood normal.        Behavior: Behavior normal.        Thought Content: Thought content normal.        Judgment: Judgment normal.    Lab Results  Component Value Date   HGBA1C 5.8 12/23/2023   HGBA1C 5.7 01/16/2023   HGBA1C 6.2 06/18/2022    Lab Results  Component Value Date   CREATININE 0.59 12/23/2023   CREATININE 0.61 01/16/2023   CREATININE 0.52 06/18/2022    Lab Results  Component Value Date   WBC 6.8 12/23/2023   HGB 14.4 12/23/2023   HCT 43.7 12/23/2023   PLT 271.0 12/23/2023   GLUCOSE 91 12/23/2023   CHOL 250 (H) 12/23/2023   TRIG 145.0 12/23/2023   HDL 70.90 12/23/2023   LDLDIRECT 148.0 12/23/2023   LDLCALC 151 (H) 12/23/2023   ALT 15 12/23/2023   AST 19 12/23/2023   NA 140 12/23/2023   K 4.2 12/23/2023   CL 102 12/23/2023   CREATININE 0.59 12/23/2023   BUN 17 12/23/2023   CO2 29 12/23/2023   TSH 1.63 12/23/2023   HGBA1C 5.8 12/23/2023    MM 3D SCREENING MAMMOGRAM BILATERAL BREAST Result Date: 10/04/2023 CLINICAL DATA:  Screening. EXAM: DIGITAL SCREENING BILATERAL MAMMOGRAM WITH TOMOSYNTHESIS AND CAD TECHNIQUE: Bilateral screening digital craniocaudal and mediolateral oblique mammograms were obtained. Bilateral screening digital breast tomosynthesis was performed. The images were evaluated with computer-aided detection. COMPARISON:  Previous exam(s). ACR Breast Density Category b: There are scattered areas of fibroglandular density. FINDINGS: There are no findings suspicious for  malignancy. IMPRESSION: No mammographic evidence of malignancy. A result letter of this screening mammogram will be mailed directly to the patient. RECOMMENDATION: Screening mammogram in one year. (Code:SM-B-01Y) BI-RADS CATEGORY  1: Negative. Electronically Signed   By: Dobrinka  Dimitrova M.D.   On: 10/04/2023 13:12   DG Bone Density Result Date: 10/02/2023 EXAM: DUAL X-RAY ABSORPTIOMETRY (DXA) FOR BONE MINERAL DENSITY IMPRESSION: Your patient Yuktha Kerchner  completed a BMD test on 10/02/2023 using the [employer] DXA System (software version: 14.10) manufactured by [employer]. The following summarizes the results of our evaluation. Technologist: [facility] PATIENT BIOGRAPHICAL: Name: Brelyn, Woehl Patient ID: [id number] Birth Date: [birth year] Height: 59.0 in. Gender: Female Exam Date: 10/02/2023 Weight: 103.6 lbs. Indications: Postmenopausal, Caucasian Fractures: Treatments: Calcium, Multi-Vitamin, Vitamin D  DENSITOMETRY RESULTS: Site      Region     Measured Date Measured Age WHO Classification Young Adult T-score BMD         %Change vs. Previous Significant Change (*) AP Spine L1-L4 10/02/2023 67.3 Osteoporosis -2.5 0.886 g/cm2 -3.9% Yes AP Spine L1-L4 08/09/2021 65.2 Osteopenia -2.2 0.922 g/cm2 - - DualFemur Total Left 10/02/2023 67.3 Osteoporosis -3.4 0.583 g/cm2 -4.9% Yes DualFemur Total Left 08/09/2021 65.2 Osteoporosis -3.1 0.613 g/cm2 - - DualFemur Total Mean 10/02/2023 67.3 Osteoporosis -3.2 0.609 g/cm2 -2.4% - DualFemur Total Mean 08/09/2021 65.2 Osteoporosis -3.0 0.624 g/cm2 - - ASSESSMENT: The BMD measured at Femur Total Left is 0.583 g/cm2 with a T-score of -3.4. This patient is considered osteoporotic according to World Health Organization [facility]) criteria. The scan quality is good. Compared with prior study, there has been significant decrease in the spine. Compared with prior study, there has been no significant change in the total hip. [employer] [facility]) criteria for  post-menopausal, Caucasian Women: Normal:                   T-score at or above -1 SD Osteopenia/low bone mass: T-score between -1 and -2.5 SD Osteoporosis:             T-score at or below -2.5 SD RECOMMENDATIONS: 1. All patients should optimize calcium and vitamin D  intake. 2. Consider FDA-approved medical therapies in postmenopausal women and men aged 29 years and older, based on the following: a. A hip or vertebral(clinical or morphometric) fracture b. T-score < -2.5 at the femoral neck or spine after appropriate evaluation to exclude secondary causes c. Low bone mass (T-score between -1.0 and -2.5 at the femoral neck or spine) and a 10-year probability of a hip fracture > 3% or a 10-year probability of a major osteoporosis-related fracture > 20% based on the [place] -adapted WHO algorithm 3. Clinician judgment and/or patient preferences may indicate treatment for people with 10-year fracture probabilities above or below these levels FOLLOW-UP: People with diagnosed cases of osteoporosis or at high risk for fracture should have regular bone mineral density tests. For patients eligible for Medicare, routine testing is allowed once every 2 years. The testing frequency can be increased to one year for patients who have rapidly progressing disease, those who are receiving or discontinuing medical therapy to restore bone mass, or have additional risk factors. I have reviewed this report, and agree with the above findings. [facility] Electronically Signed   By: Norman Hopper M.D.   On: 10/02/2023 13:06    Assessment & Plan:  .There are no diagnoses linked to this encounter.   I spent 34 minutes on the day of this face to face encounter reviewing patient's  most recent visit with cardiology,  nephrology,  and neurology,  prior relevant surgical and non surgical procedures, recent  labs and imaging studies, counseling on weight management,  reviewing the assessment and plan with patient, and post visit  ordering and reviewing of  diagnostics and therapeutics with patient  .   Follow-up: No follow-ups on file.   Verneita LITTIE Kettering, MD

## 2024-06-21 DIAGNOSIS — M4692 Unspecified inflammatory spondylopathy, cervical region: Secondary | ICD-10-CM | POA: Insufficient documentation

## 2024-06-21 NOTE — Assessment & Plan Note (Signed)
 Improved symptoms with change in pillows.  Prior vowkrup included brain MRI for new onset daily headache.

## 2024-06-21 NOTE — Assessment & Plan Note (Signed)
 Managed with saline rinses after failing to respond to Atrovent .

## 2024-06-21 NOTE — Assessment & Plan Note (Signed)
.   Improved with resuming sertraline  .  She did not tolerate wellbutrin  trial. Anxiety has improved with resolution of daughter's proximity and unemployment issues. i

## 2024-06-21 NOTE — Assessment & Plan Note (Signed)

## 2024-06-21 NOTE — Progress Notes (Signed)
 Patient ID: Debbie Sanchez, female    DOB: [birth year]  Age: 68 y.o. MRN: [id number]  The patient is here for annual preventive examination and management of other chronic and acute problems.   The risk factors are reflected in the social history.   The roster of all physicians providing medical care to patient - is listed in the Snapshot section of the chart.   Activities of daily living:  The patient is 100% independent in all ADLs: dressing, toileting, feeding as well as independent mobility   Home safety : The patient has smoke detectors in the home. They wear seatbelts.  There are no unsecured firearms at home. There is no violence in the home.    There is no risks for hepatitis, STDs or HIV. There is no   history of blood transfusion. They have no travel history to infectious disease endemic areas of the world.   The patient has seen their dentist in the last six month. They have seen their eye doctor in the last year. The patinet  denies slight hearing difficulty with regard to whispered voices and some television programs.  They have deferred audiologic testing in the last year.  They do not  have excessive sun exposure. Discussed the need for sun protection: hats, long sleeves and use of sunscreen if there is significant sun exposure.    Diet: the importance of a healthy diet is discussed. They do have a healthy diet.   The benefits of regular aerobic exercise were discussed. The patient  exercises  3 to 5 days per week  for  60 minutes.    Depression screen: there are no signs or vegative symptoms of depression- irritability, change in appetite, anhedonia, sadness/tearfullness.   The following portions of the patient's history were reviewed and updated as appropriate: allergies, current medications, past family history, past medical history,  past surgical history, past social history  and problem list.   Visual acuity was not assessed per patient preference since the patient has  regular follow up with an  ophthalmologist. Hearing and body mass index were assessed and reviewed.    During the course of the visit the patient was educated and counseled about appropriate screening and preventive services including : fall prevention , diabetes screening, nutrition counseling, colorectal cancer screening, and recommended immunizations.    Chief Complaint:   Follow up on rhinorrhea and cough:  last seen in May ,  chest x ray normal. GERD treatment started  .  Cough is still present but her sleep app has noticed variability in cough frequency .  Still has PND attributed to snoring,  saline nasal spray has helped .  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   Has a cleft uvula .  OSA not currently treated still looking for tolerable options      Neck and shoulder pain improved with change in pillows   GAD/insomnia:  using sertraline  and trazodone  . Improved with resolution of conflict with daughter who is now gainfully employed and has moved out into patient's maternal home . However her live- in  partner is depressed and she is working on helping him move out.    Review of Symptoms  Patient denies headache, fevers, malaise, unintentional weight loss, skin rash, eye pain, sinus congestion and sinus pain, sore throat, dysphagia,  hemoptysis , cough, dyspnea, wheezing, chest pain, palpitations, orthopnea, edema, abdominal pain, nausea, melena, diarrhea, constipation, flank pain, dysuria, hematuria, urinary  Frequency, nocturia, numbness, tingling, seizures,  Focal weakness, Loss of consciousness,  Tremor, insomnia, depression, anxiety, and suicidal ideation.    Physical Exam:  BP 104/78   Pulse (!) 106   Ht 4' 11 (1.499 m)   Wt 101 lb 12.8 oz (46.2 kg)   SpO2 99%   BMI 20.56 kg/m    Physical Exam Vitals reviewed.  Constitutional:      General: She is not in acute distress.    Appearance: Normal appearance. She is normal weight. She is not  ill-appearing, toxic-appearing or diaphoretic.  HENT:     Head: Normocephalic.  Eyes:     General: No scleral icterus.       Right eye: No discharge.        Left eye: No discharge.     Conjunctiva/sclera: Conjunctivae normal.  Cardiovascular:     Rate and Rhythm: Normal rate and regular rhythm.     Heart sounds: Normal heart sounds.  Pulmonary:     Effort: Pulmonary effort is normal. No respiratory distress.     Breath sounds: Normal breath sounds.  Musculoskeletal:        General: Normal range of motion.  Skin:    General: Skin is warm and dry.  Neurological:     General: No focal deficit present.     Mental Status: She is alert and oriented to person, place, and time. Mental status is at baseline.  Psychiatric:        Mood and Affect: Mood normal.        Behavior: Behavior normal.        Thought Content: Thought content normal.        Judgment: Judgment normal.     Assessment and Plan: Need for influenza vaccination -     Flu vaccine HIGH DOSE PF(Fluzone Trivalent)  Cervical spondylitis with radiculitis Assessment & Plan: Improved symptoms with change in pillows.  Prior vowkrup included brain MRI for new onset daily headache.    Chronic vasomotor rhinitis Assessment & Plan: Managed with saline rinses after failing to respond to Atrovent .    Generalized anxiety disorder Assessment & Plan: . Improved with resuming sertraline  .  She did not tolerate wellbutrin  trial. Anxiety has improved with resolution of daughter's proximity and unemployment issues. i   Encounter for preventive health examination Assessment & Plan: age appropriate education and counseling updated, referrals for preventative services and immunizations addressed, dietary and smoking counseling addressed, most recent labs reviewed.  I have personally reviewed and have noted:   1) the patient's medical and social history 2) The pt's use of alcohol, tobacco, and illicit drugs 3) The patient's current  medications and supplements 4) Functional ability including ADL's, fall risk, home safety risk, hearing and visual impairment 5) Diet and physical activities 6) Evidence for depression or mood disorder 7) The patient's height, weight, and BMI have been recorded in the chart   I have made referrals, and provided counseling and education based on review of the above    Other orders -     traZODone  HCl; TAKE 1/2 TO 1 TABLET BY MOUTH AT BEDTIMEAS NEEDED FOR SLEEP  Dispense: 90 tablet; Refill: 3    No follow-ups on file.  Verneita LITTIE Kettering, MD

## 2024-07-01 ENCOUNTER — Ambulatory Visit (INDEPENDENT_AMBULATORY_CARE_PROVIDER_SITE_OTHER): Payer: Medicare HMO | Admitting: *Deleted

## 2024-07-01 VITALS — Ht 59.0 in | Wt 99.2 lb

## 2024-07-01 DIAGNOSIS — Z0000 Encounter for general adult medical examination without abnormal findings: Secondary | ICD-10-CM

## 2024-07-01 NOTE — Progress Notes (Signed)
 Chief Complaint  Patient presents with   Medicare Wellness     Subjective:   Debbie Sanchez is a 68 y.o. female who presents for a Medicare Annual Wellness Visit.  Allergies (verified) Patient has no known allergies.   History: Past Medical History:  Diagnosis Date   Fibrocystic breast disease    rt breast aspiration 2002   GERD (gastroesophageal reflux disease)    mild, no meds, diagnosed by ENT   Hx of dysplastic nevus 03/11/2018   Upper back right paraspinal   Hx of dysplastic nevus 12/19/2016   R inframammary, mild to moderate   Menopause    last menses 2008   Past Surgical History:  Procedure Laterality Date   BREAST BIOPSY Right 2010   cyst aspiration/US  guided biopsy, clip placement   BREAST CYST ASPIRATION Right 2002   cyst aspiration   CESAREAN SECTION  1998   MOUTH SURGERY     TUBAL LIGATION  1998   at time of c-section   Family History  Problem Relation Age of Onset   Stroke Mother    Cancer Mother 24       colon CA / resected completely 1999   Asthma Mother    COPD Mother    Diabetes Mother    Hypertension Mother    Obesity Mother    Cancer Maternal Aunt        breast CA   Breast cancer Other    Cancer Father    Diabetes Father    Varicose Veins Father    Social History   Occupational History   Not on file  Tobacco Use   Smoking status: Never   Smokeless tobacco: Never  Substance and Sexual Activity   Alcohol use: Yes    Comment: rare   Drug use: No   Sexual activity: Not on file   Tobacco Counseling Counseling given: Not Answered  SDOH Screenings   Food Insecurity: No Food Insecurity (07/01/2024)  Housing: Low Risk  (07/01/2024)  Transportation Needs: No Transportation Needs (07/01/2024)  Utilities: Not At Risk (07/01/2024)  Alcohol Screen: Low Risk  (07/01/2024)  Depression (PHQ2-9): Low Risk  (07/01/2024)  Financial Resource Strain: Low Risk  (07/01/2024)  Physical Activity: Insufficiently Active (07/01/2024)  Social  Connections: Moderately Isolated (07/01/2024)  Stress: No Stress Concern Present (07/01/2024)  Tobacco Use: Low Risk  (07/01/2024)  Health Literacy: Adequate Health Literacy (07/01/2024)   Depression Screen    07/01/2024    1:18 PM 06/19/2024   10:37 AM 12/23/2023    1:22 PM 10/21/2023    4:53 PM 06/24/2023   12:55 PM 05/21/2023    4:50 PM 02/19/2023   11:27 AM  PHQ 2/9 Scores  PHQ - 2 Score 0 0 0 0 0 1 0  PHQ- 9 Score 1    1   2    Exception Documentation     Patient refusal       Data saved with a previous flowsheet row definition      Goals Addressed             This Visit's Progress    Patient Stated       Wants to continue to exercise, stay on track mentally       Visit info / Clinical Intake: Medicare Wellness Visit Type:: Subsequent Annual Wellness Visit Persons participating in visit:: patient Medicare Wellness Visit Mode:: Telephone If telephone:: video declined Because this visit was a virtual/telehealth visit:: pt reported vitals If Telephone or Video please  confirm:: I connected with the patient using audio enabled telemedicine application and verified that I am speaking with the correct person using two identifiers; I discussed the limitations of evaluation and management by telemedicine; The patient expressed understanding and agreed to proceed Patient Location:: Home Provider Location:: Office/Clinic Information given by:: patient Interpreter Needed?: No Pre-visit prep was completed: yes AWV questionnaire completed by patient prior to visit?: yes Date:: 07/01/24 Living arrangements:: lives with spouse/significant other Patient's Overall Health Status Rating: good Typical amount of pain: some Does pain affect daily life?: no Are you currently prescribed opioids?: no  Dietary Habits and Nutritional Risks How many meals a day?: 3 Eats fruit and vegetables daily?: yes Most meals are obtained by: preparing own meals In the last 2 weeks, have you had any of  the following?: none Diabetic:: no  Functional Status Activities of Daily Living (to include ambulation/medication): (Patient-Rptd) Independent Ambulation: (Patient-Rptd) Independent Medication Administration: Independent Home Management: (Patient-Rptd) Independent Manage your own finances?: yes Primary transportation is: driving Concerns about vision?: no *vision screening is required for WTM* Concerns about hearing?: no  Fall Screening Falls in the past year?: (Patient-Rptd) 0 Number of falls in past year: 0 Was there an injury with Fall?: 0 Fall Risk Category Calculator: 0 Patient Fall Risk Level: Low Fall Risk  Fall Risk Patient at Risk for Falls Due to: No Fall Risks Fall risk Follow up: Falls evaluation completed; Falls prevention discussed  Home and Transportation Safety: All rugs have non-skid backing?: (!) no All stairs or steps have railings?: yes Grab bars in the bathtub or shower?: (!) no Have non-skid surface in bathtub or shower?: yes Good home lighting?: yes Regular seat belt use?: yes Hospital stays in the last year:: no  Cognitive Assessment Difficulty concentrating, remembering, or making decisions? : no Will 6CIT or Mini Cog be Completed: yes What year is it?: 0 points What month is it?: 0 points Give patient an address phrase to remember (5 components): [address] [place] About what time is it?: 0 points Count backwards from 20 to 1: 0 points Say the months of the year in reverse: 0 points Repeat the address phrase from earlier: 0 points 6 CIT Score: 0 points  Advance Directives (For Healthcare) Does Patient Have a Medical Advance Directive?: No Would patient like information on creating a medical advance directive?: No - Patient declined  Reviewed/Updated  Reviewed/Updated: Reviewed All (Medical, Surgical, Family, Medications, Allergies, Care Teams, Patient Goals)        Objective:    Today's Vitals   07/01/24 1304  Weight: 99  lb 4 oz (45 kg)  Height: 4' 11 (1.499 m)   Body mass index is 20.05 kg/m.  Current Medications (verified) Outpatient Encounter Medications as of 07/01/2024  Medication Sig   ALPRAZolam  (XANAX ) 0.25 MG tablet TAKE ONE TABLET AT BEDTIME AS NEEDED FORANXIETY   Ascorbic Acid (VITAMIN C) 100 MG tablet Take 1,000 mg by mouth 2 (two) times daily.    Calcium Carbonate-Vit D-Min (CALCIUM 1200 PO) Take 1 tablet by mouth.   Cholecalciferol (VITAMIN D3) 125 MCG (5000 UT) TABS Take 5,000 Units by mouth.   CINNAMON PO Take 1 tablet by mouth daily.   CRANBERRY CONCENTRATE PO Take 1 tablet by mouth.   Cyanocobalamin  (VITAMIN B 12) 500 MCG TABS Take by mouth.   Lidocaine  5 % CREA Apply once or twice daily as needed to itchy area on back   Multiple Vitamin (MULTIVITAMIN) tablet Take 1 tablet by mouth daily.  propranolol  (INDERAL ) 10 MG tablet Take 1 tablet (10 mg total) by mouth 3 (three) times daily. As needed for rapid heart rate   sertraline  (ZOLOFT ) 50 MG tablet TAKE 1 TABLET BY MOUTH DAILY.   traZODone  (DESYREL ) 50 MG tablet TAKE 1/2 TO 1 TABLET BY MOUTH AT BEDTIMEAS NEEDED FOR SLEEP   Facility-Administered Encounter Medications as of 07/01/2024  Medication   denosumab  (PROLIA ) injection 60 mg   [START ON 11/23/2024] denosumab  (PROLIA ) injection 60 mg   Hearing/Vision screen Hearing Screening - Comments:: No issues Vision Screening - Comments:: Contacts, [facility]  up to date Immunizations and Health Maintenance Health Maintenance  Topic Date Due   COVID-19 Vaccine (5 - 2025-26 season) 07/04/2024 (Originally 04/20/2024)   Mammogram  10/01/2024   Medicare Annual Wellness (AWV)  07/01/2025   Colonoscopy  06/18/2028   DTaP/Tdap/Td (3 - Td or Tdap) 08/27/2033   Pneumococcal Vaccine: 50+ Years  Completed   Influenza Vaccine  Completed   DEXA SCAN  Completed   Hepatitis C Screening  Completed   Zoster Vaccines- Shingrix  Completed   Meningococcal B Vaccine  Aged Out         Assessment/Plan:  This is a routine wellness examination for Debbie Sanchez.  Patient Care Team: Marylynn Verneita CROME, MD as PCP - General (Internal Medicine) Marylynn Verneita CROME, MD (Internal Medicine) Hester Alm BROCKS, MD (Dermatology)  I have personally reviewed and noted the following in the patient's chart:   Medical and social history Use of alcohol, tobacco or illicit drugs  Current medications and supplements including opioid prescriptions. Functional ability and status Nutritional status Physical activity Advanced directives List of other physicians Hospitalizations, surgeries, and ER visits in previous 12 months Vitals Screenings to include cognitive, depression, and falls Referrals and appointments  No orders of the defined types were placed in this encounter.  In addition, I have reviewed and discussed with patient certain preventive protocols, quality metrics, and best practice recommendations. A written personalized care plan for preventive services as well as general preventive health recommendations were provided to patient.   Angeline Fredericks, LPN   [id number]/[id number]   Return in 1 year (on 07/01/2025).  After Visit Summary: (MyChart) Due to this being a telephonic visit, the after visit summary with patients personalized plan was offered to patient via MyChart   Nurse Notes: Discussed need to keep vaccines up to date.

## 2024-07-01 NOTE — Patient Instructions (Signed)
 [name],  Thank you for taking the time for your Medicare Wellness Visit. I appreciate your continued commitment to your health goals. Please review the care plan we discussed, and feel free to reach out if I can assist you further.  Please note that Annual Wellness Visits do not include a physical exam. Some assessments may be limited, especially if the visit was conducted virtually. If needed, we may recommend an in-person follow-up with your provider.  Ongoing Care Seeing your primary care provider every 3 to 6 months helps us  monitor your health and provide consistent, personalized care.  Remember to keep your vaccines up to date.  Referrals If a referral was made during today's visit and you haven't received any updates within two weeks, please contact the referred provider directly to check on the status.  Recommended Screenings:  Health Maintenance  Topic Date Due   COVID-19 Vaccine (5 - 2025-26 season) 07/04/2024*   Breast Cancer Screening  10/01/2024   Medicare Annual Wellness Visit  07/01/2025   Colon Cancer Screening  06/18/2028   DTaP/Tdap/Td vaccine (3 - Td or Tdap) 08/27/2033   Pneumococcal Vaccine for age over 61  Completed   Flu Shot  Completed   DEXA scan (bone density measurement)  Completed   Hepatitis C Screening  Completed   Zoster (Shingles) Vaccine  Completed   Meningitis B Vaccine  Aged Out  *Topic was postponed. The date shown is not the original due date.       07/01/2024   12:02 PM  Advanced Directives  Does Patient Have a Medical Advance Directive? No  Would patient like information on creating a medical advance directive? No - Patient declined    Vision: Annual vision screenings are recommended for early detection of glaucoma, cataracts, and diabetic retinopathy. These exams can also reveal signs of chronic conditions such as diabetes and high blood pressure.  Dental: Annual dental screenings help detect early signs of oral cancer, gum disease,  and other conditions linked to overall health, including heart disease and diabetes.  Please see the attached documents for additional preventive care recommendations.

## 2024-07-25 ENCOUNTER — Other Ambulatory Visit: Payer: Self-pay | Admitting: Internal Medicine

## 2024-08-24 ENCOUNTER — Other Ambulatory Visit: Payer: Self-pay | Admitting: Internal Medicine

## 2024-08-24 DIAGNOSIS — Z1231 Encounter for screening mammogram for malignant neoplasm of breast: Secondary | ICD-10-CM

## 2024-08-25 ENCOUNTER — Ambulatory Visit: Admitting: Internal Medicine

## 2024-10-02 ENCOUNTER — Encounter

## 2024-10-13 ENCOUNTER — Encounter

## 2024-11-25 ENCOUNTER — Ambulatory Visit

## 2024-12-17 ENCOUNTER — Ambulatory Visit: Admitting: Internal Medicine

## 2025-02-02 ENCOUNTER — Ambulatory Visit: Admitting: Dermatology

## 2025-07-05 ENCOUNTER — Ambulatory Visit
